# Patient Record
Sex: Female | Born: 1965 | Marital: Married | Smoking: Never smoker
Health system: Southern US, Community
[De-identification: ages and names within clinical notes are randomized; demographics above are authoritative.]

## PROBLEM LIST (undated history)

## (undated) DIAGNOSIS — F319 Bipolar disorder, unspecified: Secondary | ICD-10-CM

## (undated) DIAGNOSIS — E119 Type 2 diabetes mellitus without complications: Secondary | ICD-10-CM

## (undated) DIAGNOSIS — M1990 Unspecified osteoarthritis, unspecified site: Secondary | ICD-10-CM

## (undated) DIAGNOSIS — L989 Disorder of the skin and subcutaneous tissue, unspecified: Secondary | ICD-10-CM

## (undated) DIAGNOSIS — F329 Major depressive disorder, single episode, unspecified: Secondary | ICD-10-CM

## (undated) DIAGNOSIS — F32A Depression, unspecified: Secondary | ICD-10-CM

## (undated) DIAGNOSIS — N644 Mastodynia: Secondary | ICD-10-CM

## (undated) DIAGNOSIS — H9192 Unspecified hearing loss, left ear: Secondary | ICD-10-CM

## (undated) DIAGNOSIS — K3184 Gastroparesis: Secondary | ICD-10-CM

## (undated) HISTORY — DX: Depression, unspecified: F32.A

## (undated) HISTORY — DX: Gastroparesis: K31.84

## (undated) HISTORY — PX: BREAST EXCISIONAL BIOPSY: SUR124

## (undated) HISTORY — PX: ELBOW SURGERY: SHX618

## (undated) HISTORY — PX: GALLBLADDER SURGERY: SHX652

## (undated) HISTORY — DX: Unspecified hearing loss, left ear: H91.92

## (undated) HISTORY — DX: Disorder of the skin and subcutaneous tissue, unspecified: L98.9

## (undated) HISTORY — PX: INNER EAR SURGERY: SHX679

## (undated) HISTORY — PX: ANKLE FRACTURE SURGERY: SHX122

## (undated) HISTORY — PX: OTHER SURGICAL HISTORY: SHX169

## (undated) HISTORY — PX: CERVICAL DISC SURGERY: SHX588

## (undated) HISTORY — PX: BREAST ENHANCEMENT SURGERY: SHX7

## (undated) HISTORY — DX: Unspecified osteoarthritis, unspecified site: M19.90

## (undated) HISTORY — DX: Type 2 diabetes mellitus without complications: E11.9

## (undated) HISTORY — DX: Bipolar disorder, unspecified: F31.9

## (undated) HISTORY — PX: MINOR CARPAL TUNNEL: SHX6472

## (undated) HISTORY — DX: Major depressive disorder, single episode, unspecified: F32.9

---

## 2010-08-16 LAB — CARDIAC PANEL,(CK, CKMB & TROPONIN)
CK - MB: 0.7 ng/ml (ref 0.5–3.6)
CK-MB Index: 0.9 % (ref 0.0–4.0)
CK: 80 U/L (ref 26–192)
Troponin-I, QT: <0.04 NG/ML (ref 0.00–0.05)

## 2010-08-16 LAB — URINALYSIS W/ RFLX MICROSCOPIC
Bilirubin: NEGATIVE
Blood: NEGATIVE
Glucose: >1000 MG/DL — AB
Ketone: NEGATIVE MG/DL
Leukocyte Esterase: NEGATIVE
Nitrites: NEGATIVE
Protein: NEGATIVE MG/DL
Specific gravity: 1.015 (ref 1.003–1.030)
Urobilinogen: 0.2 EU/DL (ref 0.2–1.0)
pH (UA): 6 (ref 5.0–8.0)

## 2010-08-16 LAB — CBC WITH AUTOMATED DIFF
ABS. BASOPHILS: 0 10*3/uL (ref 0.0–0.06)
ABS. EOSINOPHILS: 0.1 10*3/uL (ref 0.0–0.4)
ABS. LYMPHOCYTES: 1 10*3/uL (ref 0.9–3.6)
ABS. MONOCYTES: 0.4 10*3/uL (ref 0.05–1.2)
ABS. NEUTROPHILS: 6.5 10*3/uL (ref 1.8–8.0)
BASOPHILS: 0 % (ref 0–2)
EOSINOPHILS: 1 % (ref 0–5)
HCT: 38.2 % (ref 35.0–45.0)
HGB: 12.2 g/dL (ref 12.0–16.0)
LYMPHOCYTES: 12 % — ABNORMAL LOW (ref 21–52)
MCH: 29.2 PG (ref 24.0–34.0)
MCHC: 31.9 g/dL (ref 31.0–37.0)
MCV: 91.4 FL (ref 74.0–97.0)
MONOCYTES: 6 % (ref 3–10)
MPV: 12.4 FL — ABNORMAL HIGH (ref 9.2–11.8)
NEUTROPHILS: 81 % — ABNORMAL HIGH (ref 40–73)
PLATELET: 232 10*3/uL (ref 135–420)
RBC: 4.18 M/uL — ABNORMAL LOW (ref 4.20–5.30)
RDW: 13 % (ref 11.6–14.5)
WBC: 7.9 10*3/uL (ref 4.6–13.2)

## 2010-08-16 LAB — METABOLIC PANEL, COMPREHENSIVE
A-G Ratio: 0.9 (ref 0.8–1.7)
ALT (SGPT): 39 U/L (ref 30–65)
AST (SGOT): 19 U/L (ref 15–37)
Albumin: 3.6 g/dL (ref 3.4–5.0)
Alk. phosphatase: 98 U/L (ref 50–136)
Anion gap: 3 mmol/L — ABNORMAL LOW (ref 5–15)
BUN/Creatinine ratio: 19 (ref 12–20)
BUN: 17 MG/DL (ref 7–18)
Bilirubin, total: 0.4 MG/DL (ref 0.2–1.0)
CO2: 31 MMOL/L (ref 21–32)
Calcium: 8.6 MG/DL (ref 8.4–10.4)
Chloride: 98 MMOL/L — ABNORMAL LOW (ref 100–108)
Creatinine: 0.9 MG/DL (ref 0.6–1.3)
GFR est AA: >60 mL/min/{1.73_m2} (ref >60)
GFR est non-AA: >60 mL/min/{1.73_m2} (ref >60)
Globulin: 3.8 g/dL (ref 2.0–4.0)
Glucose: 351 MG/DL — ABNORMAL HIGH (ref 74–99)
Potassium: 4.7 MMOL/L (ref 3.5–5.5)
Protein, total: 7.4 g/dL (ref 6.4–8.2)
Sodium: 132 MMOL/L — ABNORMAL LOW (ref 136–145)

## 2010-08-16 LAB — HCG QL SERUM: HCG, Ql.: NEGATIVE

## 2010-08-16 LAB — GLUCOSE, POC: Glucose (POC): 334 mg/dL — ABNORMAL HIGH (ref 70–110)

## 2011-07-23 LAB — METABOLIC PANEL, COMPREHENSIVE
A-G Ratio: 1 (ref 0.8–1.7)
ALT (SGPT): 37 U/L (ref 30–65)
AST (SGOT): 17 U/L (ref 15–37)
Albumin: 3.3 g/dL — ABNORMAL LOW (ref 3.4–5.0)
Alk. phosphatase: 87 U/L (ref 50–136)
Anion gap: 5 mmol/L (ref 5–15)
BUN/Creatinine ratio: 16 (ref 12–20)
BUN: 16 MG/DL (ref 7–18)
Bilirubin, total: 0.2 MG/DL (ref 0.2–1.0)
CO2: 27 MMOL/L (ref 21–32)
Calcium: 8.1 MG/DL — ABNORMAL LOW (ref 8.4–10.4)
Chloride: 101 MMOL/L (ref 100–108)
Creatinine: 1 MG/DL (ref 0.6–1.3)
GFR est AA: >60 mL/min/{1.73_m2} (ref >60)
GFR est non-AA: >60 mL/min/{1.73_m2} (ref >60)
Globulin: 3.3 g/dL (ref 2.0–4.0)
Glucose: 275 MG/DL — ABNORMAL HIGH (ref 74–99)
Potassium: 4.1 MMOL/L (ref 3.5–5.5)
Protein, total: 6.6 g/dL (ref 6.4–8.2)
Sodium: 133 MMOL/L — ABNORMAL LOW (ref 136–145)

## 2011-07-23 LAB — URINALYSIS W/ RFLX MICROSCOPIC
Bilirubin: NEGATIVE
Blood: NEGATIVE
Glucose: >1000 MG/DL — AB
Ketone: NEGATIVE MG/DL
Leukocyte Esterase: NEGATIVE
Nitrites: NEGATIVE
Protein: NEGATIVE MG/DL
Specific gravity: 1.02 (ref 1.003–1.030)
Urobilinogen: 0.2 EU/DL (ref 0.2–1.0)
pH (UA): 6.5 (ref 5.0–8.0)

## 2011-07-23 LAB — CBC WITH AUTOMATED DIFF
ABS. BASOPHILS: 0 10*3/uL (ref 0.0–0.06)
ABS. EOSINOPHILS: 0.2 10*3/uL (ref 0.0–0.4)
ABS. LYMPHOCYTES: 2.1 10*3/uL (ref 0.9–3.6)
ABS. MONOCYTES: 0.5 10*3/uL (ref 0.05–1.2)
ABS. NEUTROPHILS: 4.2 10*3/uL (ref 1.8–8.0)
BASOPHILS: 0 % (ref 0–2)
EOSINOPHILS: 3 % (ref 0–5)
HCT: 38.8 % (ref 35.0–45.0)
HGB: 13 g/dL (ref 12.0–16.0)
LYMPHOCYTES: 30 % (ref 21–52)
MCH: 30.2 PG (ref 24.0–34.0)
MCHC: 33.5 g/dL (ref 31.0–37.0)
MCV: 90.2 FL (ref 74.0–97.0)
MONOCYTES: 7 % (ref 3–10)
MPV: 11.8 FL (ref 9.2–11.8)
NEUTROPHILS: 60 % (ref 40–73)
PLATELET: 217 10*3/uL (ref 135–420)
RBC: 4.3 M/uL (ref 4.20–5.30)
RDW: 13.1 % (ref 11.6–14.5)
WBC: 7 10*3/uL (ref 4.6–13.2)

## 2011-07-23 LAB — LIPASE: Lipase: 54 U/L — ABNORMAL LOW (ref 73–393)

## 2011-07-23 MED ORDER — MORPHINE 4 MG/ML SYRINGE
4 mg/mL | INTRAMUSCULAR | Status: DC
Start: 2011-07-23 — End: 2011-07-23

## 2011-07-23 MED ORDER — ONDANSETRON HCL 4 MG TAB
4 mg | ORAL_TABLET | Freq: Three times a day (TID) | ORAL | Status: DC | PRN
Start: 2011-07-23 — End: 2013-10-17

## 2011-07-23 MED ORDER — HYDROCODONE-ACETAMINOPHEN 5 MG-325 MG TAB
5-325 mg | ORAL_TABLET | Freq: Four times a day (QID) | ORAL | Status: DC | PRN
Start: 2011-07-23 — End: 2013-12-02

## 2011-07-23 MED ORDER — ONDANSETRON (PF) 4 MG/2 ML INJECTION
4 mg/2 mL | Freq: Once | INTRAMUSCULAR | Status: AC
Start: 2011-07-23 — End: 2011-07-23
  Administered 2011-07-23: 05:00:00 via INTRAVENOUS

## 2011-07-23 MED ORDER — KETOROLAC TROMETHAMINE 30 MG/ML INJECTION
30 mg/mL (1 mL) | Freq: Once | INTRAMUSCULAR | Status: AC
Start: 2011-07-23 — End: 2011-07-23
  Administered 2011-07-23: 05:00:00 via INTRAVENOUS

## 2011-07-23 MED ORDER — MECLIZINE 12.5 MG TAB
12.5 mg | ORAL | Status: DC
Start: 2011-07-23 — End: 2011-07-23

## 2011-07-23 MED ORDER — MECLIZINE 25 MG TAB
25 mg | ORAL_TABLET | Freq: Three times a day (TID) | ORAL | Status: AC | PRN
Start: 2011-07-23 — End: 2011-08-02

## 2011-07-23 MED ADMIN — sodium chloride 0.9 % bolus infusion 1,000 mL: INTRAVENOUS | @ 05:00:00 | NDC 00409798309

## 2011-07-23 NOTE — ED Provider Notes (Signed)
I was personally available for consultation in the emergency department.  I have reviewed the chart and agree with the documentation recorded by the MLP, including the assessment, treatment plan, and disposition.  Eda Magnussen S Amely Voorheis, MD

## 2011-07-23 NOTE — ED Provider Notes (Signed)
HPI Comments: 45 y.o. female presents to ED C/O dizziness. Pt describes the dizziness as the room spinning. Pt reports that while in bed this PM she began to have abd pain that felt like it might be heartburn. Pt then reports that she got really dizzy and nauseated so she got up and went to the bathroom, but she did not vomit. Pt states that she went back and laid down, but was very dizzy when she closed her eyes so she got back up and did vomit. Pt reports that she had a similar episode 1 week ago, but it passed and was not as bad as this. Pt reports that she has surgery to close up her L ear drum this past May (about 3-4 months ago), and that the surgery was complex because her ear is so small. Pt reports that since the surgery she has had sporadic dizzy spells. Pt had a follow up appointment with her ENT doctor, Dr. Sarina Ser, but cancelled it and will not be able to follow-up until September or October. Dr. Rock Nephew denies HA, change in vision, blurry vision, or any other sxs or complaints.    Patient is a 45 y.o. female presenting with dizziness. The history is provided by the patient. No language interpreter was used.   Dizziness  This is a new problem. The current episode started 3 to 5 hours ago. The problem has not changed since onset.Pertinent negatives include no visual change. There has been no fever. Associated symptoms include vomiting and nausea. Pertinent negatives include no shortness of breath, no chest pain and no headaches.      Written by Arvilla Market, ED Scribe, as dictated by Reina Fuse, PA-C.    Past Medical History   Diagnosis Date   ??? Diabetes    ??? Psychiatric disorder      bipolar        Past Surgical History   Procedure Date   ??? Hx heent      ear surgery         No family history on file.     History     Social History   ??? Marital Status: Married     Spouse Name: N/A     Number of Children: N/A   ??? Years of Education: N/A     Occupational History   ??? Not on file.      Social History Main Topics   ??? Smoking status: Never Smoker    ??? Smokeless tobacco: Not on file   ??? Alcohol Use:    ??? Drug Use:    ??? Sexually Active:      Other Topics Concern   ??? Not on file     Social History Narrative   ??? No narrative on file                  ALLERGIES: Review of patient's allergies indicates no known allergies.      Review of Systems   Constitutional: Negative for fever, activity change, appetite change and unexpected weight change.   HENT: Negative for congestion and sore throat.    Eyes: Negative for visual disturbance.   Respiratory: Negative for shortness of breath.    Cardiovascular: Negative for chest pain.   Gastrointestinal: Positive for nausea and vomiting. Negative for abdominal pain and diarrhea.   Genitourinary: Negative for difficulty urinating.   Musculoskeletal: Negative for back pain.   Skin: Negative for rash.   Neurological: Positive for dizziness. Negative for  headaches.   All other systems reviewed and are negative.      Written by Arvilla Market, ED Scribe, as dictated by Reina Fuse, PA-C.    Filed Vitals:    07/23/11 0016   BP: 132/77   Pulse: 83   Temp: 97.9 ??F (36.6 ??C)   Resp: 18   Height: 5\' 2"  (1.575 m)   Weight: 61.236 kg (135 lb)   SpO2: 99%            Physical Exam   Nursing note and vitals reviewed.  Constitutional: She is oriented to person, place, and time. She appears well-developed and well-nourished. She appears distressed (mild distress due to dizziness and nausea).   HENT:   Head: Normocephalic and atraumatic.   Right Ear: Tympanic membrane, external ear and ear canal normal.   Left Ear: Tympanic membrane, external ear and ear canal normal.   Nose: Nose normal. No mucosal edema or rhinorrhea.   Mouth/Throat: Oropharynx is clear and moist.   Eyes: Conjunctivae and EOM are normal. Pupils are equal, round, and reactive to light.   Neck: Normal range of motion. Neck supple.   Cardiovascular: Normal rate, regular rhythm and normal heart sounds.     Pulmonary/Chest: Effort normal and breath sounds normal. No respiratory distress.   Abdominal: Soft. Bowel sounds are normal. She exhibits no distension. There is no tenderness. There is no rebound and no guarding.   Musculoskeletal: Normal range of motion.   Lymphadenopathy:     She has no cervical adenopathy.   Neurological: She is alert and oriented to person, place, and time. She has normal reflexes. She displays normal reflexes. No cranial nerve deficit. Coordination normal.   Skin: Skin is warm and dry.        MDM     Differential Diagnosis; Clinical Impression; Plan:     Pt with dizziness, worse with movement of head or upper body. +N/V. Labs unremarkable. Pt feeling better.   Amount and/or Complexity of Data Reviewed:   Clinical lab tests:  Ordered and reviewed  Progress:   Patient progress:  Improved      Procedures        RESULTS    EKG FINDING:  1:21 AM  Normal Sinus Rhythm; rate: 73bpm; Other findings: Normal ECG; Read by Reina Fuse, PA-C at 00:57  Written by Arvilla Market, ED Scribe, as dictated by Reina Fuse, PA-C     Labs Reviewed   METABOLIC PANEL, COMPREHENSIVE - Abnormal; Notable for the following:     Sodium 133 (*)     Glucose 275 (*)     Calcium 8.1 (*)     Albumin 3.3 (*)     All other components within normal limits   LIPASE - Abnormal; Notable for the following:     Lipase 54 (*)     All other components within normal limits   URINALYSIS W/ RFLX MICROSCOPIC - Abnormal; Notable for the following:     Glucose >1000 (*)     All other components within normal limits   CBC WITH AUTOMATED DIFF       Recent Results (from the past 12 hour(s))   EKG, 12 LEAD, INITIAL    Collection Time    07/23/11 12:57 AM       Component Value Range    Ventricular Rate 73      Atrial Rate 73      P-R Interval 134      QRS Duration 82  Q-T Interval 414      QTC Calculation (Bezet) 456      Calculated P Axis 71      Calculated R Axis 79      Calculated T Axis 72      Diagnosis         Value: Normal sinus rhythm      Normal ECG      When compared with ECG of 16-Aug-2010 16:29,      No significant change was found   METABOLIC PANEL, COMPREHENSIVE    Collection Time    07/23/11  1:00 AM       Component Value Range    Sodium 133 (*) 136 - 145 (MMOL/L)    Potassium 4.1  3.5 - 5.5 (MMOL/L)    Chloride 101  100 - 108 (MMOL/L)    CO2 27  21 - 32 (MMOL/L)    Anion gap 5  5 - 15 (mmol/L)    Glucose 275 (*) 74 - 99 (MG/DL)    BUN 16  7 - 18 (MG/DL)    Creatinine 1.0  0.6 - 1.3 (MG/DL)    BUN/Creatinine ratio 16  12 - 20 ( )    GFR est AA >60  >60 (ml/min/1.93m2)    GFR est non-AA >60  >60 (ml/min/1.10m2)    Calcium 8.1 (*) 8.4 - 10.4 (MG/DL)    Bilirubin, total 0.2  0.2 - 1.0 (MG/DL)    ALT 37  30 - 65 (U/L)    AST 17  15 - 37 (U/L)    Alk. phosphatase 87  50 - 136 (U/L)    Protein, total 6.6  6.4 - 8.2 (g/dL)    Albumin 3.3 (*) 3.4 - 5.0 (g/dL)    Globulin 3.3  2.0 - 4.0 (g/dL)    A-G Ratio 1.0  0.8 - 1.7 ( )   LIPASE    Collection Time    07/23/11  1:00 AM       Component Value Range    Lipase 54 (*) 73 - 393 (U/L)   CBC WITH AUTOMATED DIFF    Collection Time    07/23/11  1:00 AM       Component Value Range    WBC 7.0  4.6 - 13.2 (K/uL)    RBC 4.30  4.20 - 5.30 (M/uL)    HGB 13.0  12.0 - 16.0 (g/dL)    HCT 16.1  09.6 - 04.5 (%)    MCV 90.2  74.0 - 97.0 (FL)    MCH 30.2  24.0 - 34.0 (PG)    MCHC 33.5  31.0 - 37.0 (g/dL)    RDW 40.9  81.1 - 91.4 (%)    PLATELET 217  135 - 420 (K/uL)    MPV 11.8  9.2 - 11.8 (FL)    NEUTROPHILS 60  40 - 73 (%)    LYMPHOCYTES 30  21 - 52 (%)    MONOCYTES 7  3 - 10 (%)    EOSINOPHILS 3  0 - 5 (%)    BASOPHILS 0  0 - 2 (%)    ABS. NEUTROPHILS 4.2  1.8 - 8.0 (K/UL)    ABS. LYMPHOCYTES 2.1  0.9 - 3.6 (K/UL)    ABS. MONOCYTES 0.5  0.05 - 1.2 (K/UL)    ABS. EOSINOPHILS 0.2  0.0 - 0.4 (K/UL)    ABS. BASOPHILS 0.0  0.0 - 0.06 (K/UL)    DF AUTOMATED     URINALYSIS W/ RFLX MICROSCOPIC  Collection Time    07/23/11  1:08 AM       Component Value Range    Color YELLOW       Appearance HAZY      Specific gravity 1.020  1.003 - 1.030 ( )    pH 6.5  5.0 - 8.0 ( )    Protein NEGATIVE   NEGATIVE (MG/DL)    Glucose >4540 (*) NEGATIVE (MG/DL)    Ketone NEGATIVE   NEGATIVE (MG/DL)    Bilirubin NEGATIVE   NEGATIVE     Blood NEGATIVE   NEGATIVE     Urobilinogen 0.2  0.2 - 1.0 (EU/DL)    Nitrites NEGATIVE   NEGATIVE     Leukocyte Esterase NEGATIVE   NEGATIVE        2:26 AM  Patients results have been reviewed with them.  Patient and/or family have verbally conveyed their understanding and agreement of the patient's signs, symptoms, diagnosis, treatment and prognosis and additionally agree to follow up as recommended or return to the Emergency Room should their condition change prior to their follow-up appointment. Patient verbally agrees with the care-plan and verbally conveys that all of their questions have been answered.   Discharge instructions have also been provided to the patient with some educational information regarding their diagnosis as well a list of reasons why they would want to return to the ER prior to their follow-up appointment should their condition change.     CLINICAL IMPRESSION    1. Vertigo, benign paroxysmal    2. Nausea with vomiting                Answered pt and family questions. Written by Arvilla Market, ED Scribe, as dictated by Reina Fuse, PA-C.

## 2011-07-23 NOTE — ED Notes (Signed)
Pt sitting upright in stretcher. Fam at bedside. INT in place, site benign. Pt co return in nausea and pain

## 2011-07-23 NOTE — Other (Signed)
Pt dcd, verbs understanding of dc instructions. NAD.

## 2011-07-23 NOTE — ED Notes (Signed)
Complains of waking up with dizziness and nausea.

## 2011-07-23 NOTE — ED Notes (Signed)
Pt complains of nausea and abdominal pain, worsening with movement and laying flat, started around 2330 tonight, history of ear surgery in May.

## 2011-07-24 LAB — EKG, 12 LEAD, INITIAL
Atrial Rate: 73 {beats}/min
Calculated P Axis: 71 degrees
Calculated R Axis: 79 degrees
Calculated T Axis: 72 degrees
Diagnosis: NORMAL
P-R Interval: 134 ms
Q-T Interval: 414 ms
QRS Duration: 82 ms
QTC Calculation (Bezet): 456 ms
Ventricular Rate: 73 {beats}/min

## 2011-12-12 DIAGNOSIS — K5900 Constipation, unspecified: Secondary | ICD-10-CM | POA: Diagnosis not present

## 2011-12-12 DIAGNOSIS — E1065 Type 1 diabetes mellitus with hyperglycemia: Secondary | ICD-10-CM | POA: Diagnosis not present

## 2011-12-12 DIAGNOSIS — IMO0002 Reserved for concepts with insufficient information to code with codable children: Secondary | ICD-10-CM | POA: Diagnosis not present

## 2012-01-03 DIAGNOSIS — H905 Unspecified sensorineural hearing loss: Secondary | ICD-10-CM | POA: Diagnosis not present

## 2012-01-03 DIAGNOSIS — H908 Mixed conductive and sensorineural hearing loss, unspecified: Secondary | ICD-10-CM | POA: Diagnosis not present

## 2012-01-03 DIAGNOSIS — H7190 Unspecified cholesteatoma, unspecified ear: Secondary | ICD-10-CM | POA: Diagnosis not present

## 2012-01-03 DIAGNOSIS — H93299 Other abnormal auditory perceptions, unspecified ear: Secondary | ICD-10-CM | POA: Diagnosis not present

## 2012-01-03 DIAGNOSIS — H902 Conductive hearing loss, unspecified: Secondary | ICD-10-CM | POA: Diagnosis not present

## 2012-01-09 DIAGNOSIS — Z1231 Encounter for screening mammogram for malignant neoplasm of breast: Secondary | ICD-10-CM | POA: Diagnosis not present

## 2012-04-11 DIAGNOSIS — N644 Mastodynia: Secondary | ICD-10-CM | POA: Diagnosis not present

## 2012-04-11 DIAGNOSIS — E1065 Type 1 diabetes mellitus with hyperglycemia: Secondary | ICD-10-CM | POA: Diagnosis not present

## 2012-04-11 DIAGNOSIS — IMO0002 Reserved for concepts with insufficient information to code with codable children: Secondary | ICD-10-CM | POA: Diagnosis not present

## 2012-04-11 DIAGNOSIS — M2550 Pain in unspecified joint: Secondary | ICD-10-CM | POA: Diagnosis not present

## 2012-04-11 DIAGNOSIS — N63 Unspecified lump in unspecified breast: Secondary | ICD-10-CM | POA: Diagnosis not present

## 2012-05-02 DIAGNOSIS — F609 Personality disorder, unspecified: Secondary | ICD-10-CM | POA: Diagnosis not present

## 2012-05-02 DIAGNOSIS — F3289 Other specified depressive episodes: Secondary | ICD-10-CM | POA: Diagnosis not present

## 2012-05-02 DIAGNOSIS — F329 Major depressive disorder, single episode, unspecified: Secondary | ICD-10-CM | POA: Diagnosis not present

## 2012-06-25 DIAGNOSIS — H902 Conductive hearing loss, unspecified: Secondary | ICD-10-CM | POA: Diagnosis not present

## 2012-06-25 DIAGNOSIS — H60399 Other infective otitis externa, unspecified ear: Secondary | ICD-10-CM | POA: Diagnosis not present

## 2012-07-10 DIAGNOSIS — H60399 Other infective otitis externa, unspecified ear: Secondary | ICD-10-CM | POA: Diagnosis not present

## 2012-07-10 DIAGNOSIS — H7290 Unspecified perforation of tympanic membrane, unspecified ear: Secondary | ICD-10-CM | POA: Diagnosis not present

## 2012-07-10 DIAGNOSIS — H902 Conductive hearing loss, unspecified: Secondary | ICD-10-CM | POA: Diagnosis not present

## 2012-10-21 DIAGNOSIS — F609 Personality disorder, unspecified: Secondary | ICD-10-CM | POA: Diagnosis not present

## 2012-10-21 DIAGNOSIS — F329 Major depressive disorder, single episode, unspecified: Secondary | ICD-10-CM | POA: Diagnosis not present

## 2012-10-21 DIAGNOSIS — F3289 Other specified depressive episodes: Secondary | ICD-10-CM | POA: Diagnosis not present

## 2013-01-16 DIAGNOSIS — H905 Unspecified sensorineural hearing loss: Secondary | ICD-10-CM | POA: Diagnosis not present

## 2013-01-16 DIAGNOSIS — H902 Conductive hearing loss, unspecified: Secondary | ICD-10-CM | POA: Diagnosis not present

## 2013-01-16 DIAGNOSIS — H7290 Unspecified perforation of tympanic membrane, unspecified ear: Secondary | ICD-10-CM | POA: Diagnosis not present

## 2013-01-16 DIAGNOSIS — H6690 Otitis media, unspecified, unspecified ear: Secondary | ICD-10-CM | POA: Diagnosis not present

## 2013-01-30 DIAGNOSIS — M79609 Pain in unspecified limb: Secondary | ICD-10-CM | POA: Diagnosis not present

## 2013-01-30 DIAGNOSIS — S8290XS Unspecified fracture of unspecified lower leg, sequela: Secondary | ICD-10-CM | POA: Diagnosis not present

## 2013-01-30 DIAGNOSIS — M25579 Pain in unspecified ankle and joints of unspecified foot: Secondary | ICD-10-CM | POA: Diagnosis not present

## 2013-01-30 DIAGNOSIS — S8290XD Unspecified fracture of unspecified lower leg, subsequent encounter for closed fracture with routine healing: Secondary | ICD-10-CM | POA: Diagnosis not present

## 2013-02-04 DIAGNOSIS — M79609 Pain in unspecified limb: Secondary | ICD-10-CM | POA: Diagnosis not present

## 2013-02-04 DIAGNOSIS — G5600 Carpal tunnel syndrome, unspecified upper limb: Secondary | ICD-10-CM | POA: Diagnosis not present

## 2013-02-04 DIAGNOSIS — M25549 Pain in joints of unspecified hand: Secondary | ICD-10-CM | POA: Diagnosis not present

## 2013-02-06 DIAGNOSIS — S8290XD Unspecified fracture of unspecified lower leg, subsequent encounter for closed fracture with routine healing: Secondary | ICD-10-CM | POA: Diagnosis not present

## 2013-02-06 DIAGNOSIS — M79609 Pain in unspecified limb: Secondary | ICD-10-CM | POA: Diagnosis not present

## 2013-02-06 DIAGNOSIS — S8290XS Unspecified fracture of unspecified lower leg, sequela: Secondary | ICD-10-CM | POA: Diagnosis not present

## 2013-02-11 DIAGNOSIS — S82409A Unspecified fracture of shaft of unspecified fibula, initial encounter for closed fracture: Secondary | ICD-10-CM | POA: Diagnosis not present

## 2013-02-11 DIAGNOSIS — M79609 Pain in unspecified limb: Secondary | ICD-10-CM | POA: Diagnosis not present

## 2013-02-11 DIAGNOSIS — S82209A Unspecified fracture of shaft of unspecified tibia, initial encounter for closed fracture: Secondary | ICD-10-CM | POA: Diagnosis not present

## 2013-02-13 DIAGNOSIS — S8290XS Unspecified fracture of unspecified lower leg, sequela: Secondary | ICD-10-CM | POA: Diagnosis not present

## 2013-02-13 DIAGNOSIS — S8290XD Unspecified fracture of unspecified lower leg, subsequent encounter for closed fracture with routine healing: Secondary | ICD-10-CM | POA: Diagnosis not present

## 2013-03-11 DIAGNOSIS — H25049 Posterior subcapsular polar age-related cataract, unspecified eye: Secondary | ICD-10-CM | POA: Diagnosis not present

## 2013-03-11 DIAGNOSIS — E109 Type 1 diabetes mellitus without complications: Secondary | ICD-10-CM | POA: Diagnosis not present

## 2013-03-21 DIAGNOSIS — R209 Unspecified disturbances of skin sensation: Secondary | ICD-10-CM | POA: Diagnosis not present

## 2013-03-24 DIAGNOSIS — F3289 Other specified depressive episodes: Secondary | ICD-10-CM | POA: Diagnosis not present

## 2013-03-24 DIAGNOSIS — F329 Major depressive disorder, single episode, unspecified: Secondary | ICD-10-CM | POA: Diagnosis not present

## 2013-03-24 DIAGNOSIS — F609 Personality disorder, unspecified: Secondary | ICD-10-CM | POA: Diagnosis not present

## 2013-05-07 DIAGNOSIS — F609 Personality disorder, unspecified: Secondary | ICD-10-CM | POA: Diagnosis not present

## 2013-05-07 DIAGNOSIS — F329 Major depressive disorder, single episode, unspecified: Secondary | ICD-10-CM | POA: Diagnosis not present

## 2013-05-07 DIAGNOSIS — F3289 Other specified depressive episodes: Secondary | ICD-10-CM | POA: Diagnosis not present

## 2013-05-09 DIAGNOSIS — G5600 Carpal tunnel syndrome, unspecified upper limb: Secondary | ICD-10-CM | POA: Diagnosis not present

## 2013-05-14 DIAGNOSIS — IMO0002 Reserved for concepts with insufficient information to code with codable children: Secondary | ICD-10-CM | POA: Diagnosis not present

## 2013-05-14 DIAGNOSIS — M76899 Other specified enthesopathies of unspecified lower limb, excluding foot: Secondary | ICD-10-CM | POA: Diagnosis not present

## 2013-05-14 DIAGNOSIS — M25569 Pain in unspecified knee: Secondary | ICD-10-CM | POA: Diagnosis not present

## 2013-06-12 DIAGNOSIS — G5600 Carpal tunnel syndrome, unspecified upper limb: Secondary | ICD-10-CM | POA: Diagnosis not present

## 2013-06-13 DIAGNOSIS — Z794 Long term (current) use of insulin: Secondary | ICD-10-CM | POA: Diagnosis not present

## 2013-06-13 DIAGNOSIS — H538 Other visual disturbances: Secondary | ICD-10-CM | POA: Diagnosis not present

## 2013-06-13 DIAGNOSIS — E109 Type 1 diabetes mellitus without complications: Secondary | ICD-10-CM | POA: Diagnosis not present

## 2013-06-13 DIAGNOSIS — F319 Bipolar disorder, unspecified: Secondary | ICD-10-CM | POA: Diagnosis not present

## 2013-06-13 DIAGNOSIS — H918X9 Other specified hearing loss, unspecified ear: Secondary | ICD-10-CM | POA: Diagnosis not present

## 2013-06-13 DIAGNOSIS — R209 Unspecified disturbances of skin sensation: Secondary | ICD-10-CM | POA: Diagnosis not present

## 2013-06-13 DIAGNOSIS — Z79899 Other long term (current) drug therapy: Secondary | ICD-10-CM | POA: Diagnosis not present

## 2013-06-20 DIAGNOSIS — R109 Unspecified abdominal pain: Secondary | ICD-10-CM | POA: Diagnosis not present

## 2013-06-20 DIAGNOSIS — Z9981 Dependence on supplemental oxygen: Secondary | ICD-10-CM | POA: Diagnosis not present

## 2013-06-20 DIAGNOSIS — N1330 Unspecified hydronephrosis: Secondary | ICD-10-CM | POA: Diagnosis not present

## 2013-06-20 DIAGNOSIS — N134 Hydroureter: Secondary | ICD-10-CM | POA: Diagnosis not present

## 2013-06-20 DIAGNOSIS — N201 Calculus of ureter: Secondary | ICD-10-CM | POA: Diagnosis not present

## 2013-06-20 DIAGNOSIS — R1084 Generalized abdominal pain: Secondary | ICD-10-CM | POA: Diagnosis not present

## 2013-06-20 DIAGNOSIS — N23 Unspecified renal colic: Secondary | ICD-10-CM | POA: Diagnosis not present

## 2013-06-20 DIAGNOSIS — Z794 Long term (current) use of insulin: Secondary | ICD-10-CM | POA: Diagnosis not present

## 2013-06-20 DIAGNOSIS — J449 Chronic obstructive pulmonary disease, unspecified: Secondary | ICD-10-CM | POA: Diagnosis not present

## 2013-06-20 DIAGNOSIS — N200 Calculus of kidney: Secondary | ICD-10-CM | POA: Diagnosis not present

## 2013-06-20 DIAGNOSIS — R1031 Right lower quadrant pain: Secondary | ICD-10-CM | POA: Diagnosis not present

## 2013-06-20 DIAGNOSIS — E119 Type 2 diabetes mellitus without complications: Secondary | ICD-10-CM | POA: Diagnosis not present

## 2013-07-23 DIAGNOSIS — F3289 Other specified depressive episodes: Secondary | ICD-10-CM | POA: Diagnosis not present

## 2013-07-23 DIAGNOSIS — F609 Personality disorder, unspecified: Secondary | ICD-10-CM | POA: Diagnosis not present

## 2013-07-23 DIAGNOSIS — F329 Major depressive disorder, single episode, unspecified: Secondary | ICD-10-CM | POA: Diagnosis not present

## 2013-07-24 DIAGNOSIS — J029 Acute pharyngitis, unspecified: Secondary | ICD-10-CM | POA: Diagnosis not present

## 2013-07-29 DIAGNOSIS — H1030 Unspecified acute conjunctivitis, unspecified eye: Secondary | ICD-10-CM | POA: Diagnosis not present

## 2013-07-30 DIAGNOSIS — H25049 Posterior subcapsular polar age-related cataract, unspecified eye: Secondary | ICD-10-CM | POA: Diagnosis not present

## 2013-08-08 DIAGNOSIS — J029 Acute pharyngitis, unspecified: Secondary | ICD-10-CM | POA: Diagnosis not present

## 2013-08-20 DIAGNOSIS — F609 Personality disorder, unspecified: Secondary | ICD-10-CM | POA: Diagnosis not present

## 2013-08-20 DIAGNOSIS — F329 Major depressive disorder, single episode, unspecified: Secondary | ICD-10-CM | POA: Diagnosis not present

## 2013-08-20 DIAGNOSIS — F3289 Other specified depressive episodes: Secondary | ICD-10-CM | POA: Diagnosis not present

## 2013-10-17 DIAGNOSIS — R4182 Altered mental status, unspecified: Secondary | ICD-10-CM | POA: Diagnosis not present

## 2013-10-17 DIAGNOSIS — E161 Other hypoglycemia: Secondary | ICD-10-CM | POA: Diagnosis not present

## 2013-10-17 DIAGNOSIS — E1169 Type 2 diabetes mellitus with other specified complication: Secondary | ICD-10-CM | POA: Diagnosis not present

## 2013-10-17 DIAGNOSIS — R7301 Impaired fasting glucose: Secondary | ICD-10-CM | POA: Diagnosis not present

## 2013-10-17 DIAGNOSIS — Z794 Long term (current) use of insulin: Secondary | ICD-10-CM | POA: Diagnosis not present

## 2013-10-17 LAB — METABOLIC PANEL, COMPREHENSIVE
A-G Ratio: 0.9 (ref 0.8–1.7)
ALT (SGPT): 20 U/L (ref 13–56)
AST (SGOT): 15 U/L (ref 15–37)
Albumin: 3.3 g/dL — ABNORMAL LOW (ref 3.4–5.0)
Alk. phosphatase: 64 U/L (ref 45–117)
Anion gap: 9 mmol/L (ref 3.0–18)
BUN/Creatinine ratio: 32 — ABNORMAL HIGH (ref 12–20)
BUN: 24 MG/DL — ABNORMAL HIGH (ref 7.0–18)
Bilirubin, total: 0.4 MG/DL (ref 0.2–1.0)
CO2: 28 mmol/L (ref 21–32)
Calcium: 8.1 MG/DL — ABNORMAL LOW (ref 8.5–10.1)
Chloride: 104 mmol/L (ref 100–108)
Creatinine: 0.76 MG/DL (ref 0.6–1.3)
GFR est AA: >60 mL/min/{1.73_m2} (ref >60)
GFR est non-AA: >60 mL/min/{1.73_m2} (ref >60)
Globulin: 3.7 g/dL (ref 2.0–4.0)
Glucose: 145 mg/dL — ABNORMAL HIGH (ref 74–99)
Potassium: 3.8 mmol/L (ref 3.5–5.5)
Protein, total: 7 g/dL (ref 6.4–8.2)
Sodium: 141 mmol/L (ref 136–145)

## 2013-10-17 LAB — CBC WITH AUTOMATED DIFF
ABS. BASOPHILS: 0 10*3/uL (ref 0.0–0.06)
ABS. EOSINOPHILS: 0.3 10*3/uL (ref 0.0–0.4)
ABS. LYMPHOCYTES: 0.8 10*3/uL — ABNORMAL LOW (ref 0.9–3.6)
ABS. MONOCYTES: 0.5 10*3/uL (ref 0.05–1.2)
ABS. NEUTROPHILS: 7.1 10*3/uL (ref 1.8–8.0)
BASOPHILS: 0 % (ref 0–2)
EOSINOPHILS: 3 % (ref 0–5)
HCT: 38.5 % (ref 35.0–45.0)
HGB: 12.7 g/dL (ref 12.0–16.0)
LYMPHOCYTES: 9 % — ABNORMAL LOW (ref 21–52)
MCH: 29.6 PG (ref 24.0–34.0)
MCHC: 33 g/dL (ref 31.0–37.0)
MCV: 89.7 FL (ref 74.0–97.0)
MONOCYTES: 6 % (ref 3–10)
MPV: 12.5 FL — ABNORMAL HIGH (ref 9.2–11.8)
NEUTROPHILS: 82 % — ABNORMAL HIGH (ref 40–73)
PLATELET: 223 10*3/uL (ref 135–420)
RBC: 4.29 M/uL (ref 4.20–5.30)
RDW: 13.4 % (ref 11.6–14.5)
WBC: 8.6 10*3/uL (ref 4.6–13.2)

## 2013-10-17 LAB — GLUCOSE, POC
Glucose (POC): 160 mg/dL — ABNORMAL HIGH (ref 70–110)
Glucose (POC): 59 mg/dL — ABNORMAL LOW (ref 70–110)
Glucose (POC): 104 mg/dL (ref 70–110)
Glucose (POC): 91 mg/dL (ref 70–110)

## 2013-10-17 NOTE — ED Notes (Signed)
POC glucose=160

## 2013-10-17 NOTE — ED Notes (Signed)
Discharge paperwork given to patient.  Patient verbalizes understanding.  Armband removed and shredded.

## 2013-10-17 NOTE — ED Provider Notes (Signed)
HPI Comments:   12:57 PM   47 y.o. female with Hx of DM presents to the ED via EMS for hypoglycemia. When EMS arrived, pt's FSBS was less than 20. EMS gave D50 and POC glucose on arrival to ED was 160. Pt confirms taking insulin earlier today but did not eat a meal. Pt states she feels normal now. Pt denies chest pain, SOB and any other sx's or complaints.    Patient is a 47 y.o. female presenting with hypoglycemia. The history is provided by the patient and the EMS personnel. No language interpreter was used.   Low Blood Sugar   The current episode started less than 1 hour ago. The problem has been resolved. Her past medical history is significant for diabetes.   Written by Broadus John, ED Scribe, as dictated by Herma Carson, MD.      Past Medical History   Diagnosis Date   ??? Diabetes    ??? Psychiatric disorder      bipolar        Past Surgical History   Procedure Laterality Date   ??? Hx heent       ear surgery   ??? Hx other surgical       cosmetic surgery         No family history on file.     History     Social History   ??? Marital Status: MARRIED     Spouse Name: N/A     Number of Children: N/A   ??? Years of Education: N/A     Occupational History   ??? Not on file.     Social History Main Topics   ??? Smoking status: Never Smoker    ??? Smokeless tobacco: Not on file   ??? Alcohol Use: Not on file   ??? Drug Use: Not on file   ??? Sexually Active: Not on file     Other Topics Concern   ??? Not on file     Social History Narrative   ??? No narrative on file            ALLERGIES: Review of patient's allergies indicates no known allergies.      Review of Systems   Constitutional: Negative for fever and fatigue.        Feels back to normal.   HENT: Negative for sore throat and rhinorrhea.    Respiratory: Negative for cough and shortness of breath.    Cardiovascular: Negative for chest pain and palpitations.   Gastrointestinal: Negative for nausea, vomiting, abdominal pain and diarrhea.   Genitourinary: Negative for dysuria and  difficulty urinating.   Musculoskeletal: Negative for myalgias and arthralgias.   Skin: Negative for color change and rash.   Neurological: Negative for light-headedness and headaches.   All other systems reviewed and are negative.        Filed Vitals:    10/17/13 1400 10/17/13 1430 10/17/13 1500 10/17/13 1530   BP: 118/72 125/69 121/76 108/62   Pulse: 84 82 94 94   Temp:       Resp: 16 17 20 18    Height:       Weight:       SpO2: 100% 100% 100% 100%            Physical Exam   Nursing note and vitals reviewed.  Constitutional: She is oriented to person, place, and time. She appears well-developed and well-nourished.   HENT:   Head: Normocephalic and atraumatic.  Eyes: Conjunctivae and EOM are normal. Pupils are equal, round, and reactive to light.   Neck: Normal range of motion. Neck supple.   Cardiovascular: Normal rate, regular rhythm and normal heart sounds.    No murmur heard.  Pulmonary/Chest: Effort normal and breath sounds normal. No respiratory distress.   Abdominal: Soft. Bowel sounds are normal. There is no tenderness. There is no rebound and no guarding.   Musculoskeletal: Normal range of motion. She exhibits no edema.   Lymphadenopathy:     She has no cervical adenopathy.   Neurological: She is alert and oriented to person, place, and time. No cranial nerve deficit.   Skin: Skin is warm and dry. No rash noted. No erythema.   Psychiatric: She has a normal mood and affect. Her behavior is normal.        RESULTS:         Labs Reviewed   CBC WITH AUTOMATED DIFF - Abnormal; Notable for the following:     MPV 12.5 (*)     NEUTROPHILS 82 (*)     LYMPHOCYTES 9 (*)     ABS. LYMPHOCYTES 0.8 (*)     All other components within normal limits   METABOLIC PANEL, COMPREHENSIVE - Abnormal; Notable for the following:     Glucose 145 (*)     BUN 24 (*)     BUN/Creatinine ratio 32 (*)     Calcium 8.1 (*)     Albumin 3.3 (*)     All other components within normal limits   GLUCOSE, POC - Abnormal; Notable for the following:      Glucose (POC) 160 (*)     All other components within normal limits   GLUCOSE, POC - Abnormal; Notable for the following:     Glucose (POC) 59 (*)     All other components within normal limits   GLUCOSE, POC   POC GLUCOSE   POC GLUCOSE   POC GLUCOSE   POC GLUCOSE       Recent Results (from the past 12 hour(s))   GLUCOSE, POC    Collection Time     10/17/13 12:49 PM       Result Value Range    Glucose (POC) 160 (*) 70 - 110 mg/dL   CBC WITH AUTOMATED DIFF    Collection Time     10/17/13  1:00 PM       Result Value Range    WBC 8.6  4.6 - 13.2 K/uL    RBC 4.29  4.20 - 5.30 M/uL    HGB 12.7  12.0 - 16.0 g/dL    HCT 16.1  09.6 - 04.5 %    MCV 89.7  74.0 - 97.0 FL    MCH 29.6  24.0 - 34.0 PG    MCHC 33.0  31.0 - 37.0 g/dL    RDW 40.9  81.1 - 91.4 %    PLATELET 223  135 - 420 K/uL    MPV 12.5 (*) 9.2 - 11.8 FL    NEUTROPHILS 82 (*) 40 - 73 %    LYMPHOCYTES 9 (*) 21 - 52 %    MONOCYTES 6  3 - 10 %    EOSINOPHILS 3  0 - 5 %    BASOPHILS 0  0 - 2 %    ABS. NEUTROPHILS 7.1  1.8 - 8.0 K/UL    ABS. LYMPHOCYTES 0.8 (*) 0.9 - 3.6 K/UL    ABS. MONOCYTES 0.5  0.05 - 1.2 K/UL  ABS. EOSINOPHILS 0.3  0.0 - 0.4 K/UL    ABS. BASOPHILS 0.0  0.0 - 0.06 K/UL    DF AUTOMATED     METABOLIC PANEL, COMPREHENSIVE    Collection Time     10/17/13  1:00 PM       Result Value Range    Sodium 141  136 - 145 mmol/L    Potassium 3.8  3.5 - 5.5 mmol/L    Chloride 104  100 - 108 mmol/L    CO2 28  21 - 32 mmol/L    Anion gap 9  3.0 - 18 mmol/L    Glucose 145 (*) 74 - 99 mg/dL    BUN 24 (*) 7.0 - 18 MG/DL    Creatinine 1.61  0.6 - 1.3 MG/DL    BUN/Creatinine ratio 32 (*) 12 - 20      GFR est AA >60  >60 ml/min/1.33m2    GFR est non-AA >60  >60 ml/min/1.71m2    Calcium 8.1 (*) 8.5 - 10.1 MG/DL    Bilirubin, total 0.4  0.2 - 1.0 MG/DL    ALT 20  13 - 56 U/L    AST 15  15 - 37 U/L    Alk. phosphatase 64  45 - 117 U/L    Protein, total 7.0  6.4 - 8.2 g/dL    Albumin 3.3 (*) 3.4 - 5.0 g/dL    Globulin 3.7  2.0 - 4.0 g/dL    A-G Ratio 0.9  0.8 - 1.7      GLUCOSE, POC    Collection Time     10/17/13  3:13 PM       Result Value Range    Glucose (POC) 59 (*) 70 - 110 mg/dL   GLUCOSE, POC    Collection Time     10/17/13  3:31 PM       Result Value Range    Glucose (POC) 91  70 - 110 mg/dL        MDM     Differential Diagnosis; Clinical Impression; Plan:     DDx includes: hypoglycemia  Amount and/or Complexity of Data Reviewed:   Clinical lab tests:  Ordered and reviewed  Discussion of test results with the performing providers:  No   Decide to obtain previous medical records or to obtain history from someone other than the patient:  No   Obtain history from someone other than the patient:  Yes (EMS)   Review and summarize past medical records:  Yes   Discuss the patient with another provider:  No   Independant visualization of image, tracing, or specimen:  No      ED MEDICATIONS:  Medications - No data to display        Procedures    PROGRESS NOTE:   1:00 PM   Initial assessment performed.  Written by Broadus John, ED Scribe, as dictated by Herma Carson, MD.    DISCHARGE NOTE:  4:16 PM   Talmadge Chad  results have been reviewed with her.  She has been counseled regarding her diagnosis, treatment, and plan.  She verbally conveys understanding and agreement of the signs, symptoms, diagnosis, treatment and prognosis and additionally agrees to follow up as discussed.  She also agrees with the care-plan and conveys that all of her questions have been answered.  I have also provided discharge instructions for her that include: educational information regarding their diagnosis and treatment, and list of reasons why they would want to return to the  ED prior to their follow-up appointment, should her condition change.     CLINICAL IMPRESSION:    1. Hypoglycemia, resolved        AFTER VISIT PLAN:  1. Discharge home  2. No Rx  3. Follow up with PCP, also given endocrinology  4. Return if sx's worsen     Written by Broadus John, ED Scribe, as dictated by Herma Carson, MD.

## 2013-10-17 NOTE — ED Notes (Signed)
POC Glucose 59.  MD made aware.  Patient provided with 4 ounces of apple juice

## 2013-10-17 NOTE — ED Notes (Signed)
Patient resting supine in bed with continuous monitoring in place.  Side rails up with call bell in reach.  Patient advised of plan of care. Husband at bedside.  Patient to have repeat glucose at 1615

## 2013-10-17 NOTE — ED Notes (Signed)
Patient resting supine in bed with continuous monitoring in place.  Side rails up with call bell in reach.  Patient advised of plan of care.   Husband at bedside.  Patient awaiting lunch from dietary

## 2013-11-24 DIAGNOSIS — F329 Major depressive disorder, single episode, unspecified: Secondary | ICD-10-CM | POA: Diagnosis not present

## 2013-11-24 DIAGNOSIS — F609 Personality disorder, unspecified: Secondary | ICD-10-CM | POA: Diagnosis not present

## 2013-11-24 DIAGNOSIS — F3289 Other specified depressive episodes: Secondary | ICD-10-CM | POA: Diagnosis not present

## 2013-11-25 DIAGNOSIS — H25049 Posterior subcapsular polar age-related cataract, unspecified eye: Secondary | ICD-10-CM | POA: Diagnosis not present

## 2013-12-02 DIAGNOSIS — R937 Abnormal findings on diagnostic imaging of other parts of musculoskeletal system: Secondary | ICD-10-CM | POA: Diagnosis not present

## 2013-12-02 DIAGNOSIS — Y939 Activity, unspecified: Secondary | ICD-10-CM | POA: Diagnosis not present

## 2013-12-02 DIAGNOSIS — Z043 Encounter for examination and observation following other accident: Secondary | ICD-10-CM | POA: Diagnosis not present

## 2013-12-02 DIAGNOSIS — M25569 Pain in unspecified knee: Secondary | ICD-10-CM | POA: Diagnosis not present

## 2013-12-02 DIAGNOSIS — X58XXXA Exposure to other specified factors, initial encounter: Secondary | ICD-10-CM | POA: Diagnosis not present

## 2013-12-02 DIAGNOSIS — S8990XA Unspecified injury of unspecified lower leg, initial encounter: Secondary | ICD-10-CM | POA: Diagnosis not present

## 2013-12-02 DIAGNOSIS — M25579 Pain in unspecified ankle and joints of unspecified foot: Secondary | ICD-10-CM | POA: Diagnosis not present

## 2013-12-02 DIAGNOSIS — M79609 Pain in unspecified limb: Secondary | ICD-10-CM | POA: Diagnosis not present

## 2013-12-02 DIAGNOSIS — R609 Edema, unspecified: Secondary | ICD-10-CM | POA: Diagnosis not present

## 2013-12-02 DIAGNOSIS — E119 Type 2 diabetes mellitus without complications: Secondary | ICD-10-CM | POA: Diagnosis not present

## 2013-12-02 DIAGNOSIS — S93409A Sprain of unspecified ligament of unspecified ankle, initial encounter: Secondary | ICD-10-CM | POA: Diagnosis not present

## 2013-12-02 DIAGNOSIS — S99919A Unspecified injury of unspecified ankle, initial encounter: Secondary | ICD-10-CM | POA: Diagnosis not present

## 2013-12-02 LAB — CBC WITH AUTOMATED DIFF
ABS. BASOPHILS: 0 10*3/uL (ref 0.0–0.06)
ABS. EOSINOPHILS: 0.1 10*3/uL (ref 0.0–0.4)
ABS. LYMPHOCYTES: 1.2 10*3/uL (ref 0.9–3.6)
ABS. MONOCYTES: 0.8 10*3/uL (ref 0.05–1.2)
ABS. NEUTROPHILS: 6.4 10*3/uL (ref 1.8–8.0)
BASOPHILS: 0 % (ref 0–2)
EOSINOPHILS: 1 % (ref 0–5)
HCT: 39.4 % (ref 35.0–45.0)
HGB: 12.9 g/dL (ref 12.0–16.0)
LYMPHOCYTES: 14 % — ABNORMAL LOW (ref 21–52)
MCH: 29.4 PG (ref 24.0–34.0)
MCHC: 32.7 g/dL (ref 31.0–37.0)
MCV: 89.7 FL (ref 74.0–97.0)
MONOCYTES: 9 % (ref 3–10)
MPV: 11.5 FL (ref 9.2–11.8)
NEUTROPHILS: 76 % — ABNORMAL HIGH (ref 40–73)
PLATELET: 254 10*3/uL (ref 135–420)
RBC: 4.39 M/uL (ref 4.20–5.30)
RDW: 13.5 % (ref 11.6–14.5)
WBC: 8.5 10*3/uL (ref 4.6–13.2)

## 2013-12-02 LAB — METABOLIC PANEL, BASIC
Anion gap: 5 mmol/L (ref 3.0–18)
BUN/Creatinine ratio: 17 (ref 12–20)
BUN: 19 MG/DL — ABNORMAL HIGH (ref 7.0–18)
CO2: 31 mmol/L (ref 21–32)
Calcium: 8.8 MG/DL (ref 8.5–10.1)
Chloride: 101 mmol/L (ref 100–108)
Creatinine: 1.13 MG/DL (ref 0.6–1.3)
GFR est AA: >60 mL/min/{1.73_m2} (ref >60)
GFR est non-AA: 54 mL/min/{1.73_m2} — ABNORMAL LOW (ref >60)
Glucose: 282 mg/dL — ABNORMAL HIGH (ref 74–99)
Potassium: 4.3 mmol/L (ref 3.5–5.5)
Sodium: 137 mmol/L (ref 136–145)

## 2013-12-02 LAB — GLUCOSE, POC: Glucose (POC): 254 mg/dL — ABNORMAL HIGH (ref 70–110)

## 2013-12-02 NOTE — ED Notes (Signed)
C/o low blood sugar yesterday afternoon, states she was in the bathroom and started to have "convulsions", states she hurt her legs while she was convulsing. States she finally woke up and was able to get up. States she has had decreased appetite and cold like sx's, denies fever.

## 2013-12-02 NOTE — ED Notes (Signed)
I have reviewed discharge instructions with the patient.  The patient verbalized understanding. Patient armband removed and shredded

## 2013-12-02 NOTE — ED Notes (Signed)
POC glucose=254

## 2013-12-02 NOTE — ED Provider Notes (Signed)
HPI Comments: 7:29 AM  48 y.o. female presents to ED C/O B/L leg pain onset yesterday. Pt states she had low blood sugar yesterday afternoon which resulted in her convulsing, kicking her legs against the floor. Since the incident she says she is having a hard time walking on her feet due to the pain. Pt reports she checked her sugar prior to the incident and it was 140. Then she took her shot of insulin, ate something and her blood sugar suddenly dropped. She managed to make her way to the bedroom and eat something and felt better. Pt has hx of hypoglycemia and knows what it feels like. Pt says that she has had recent cold sxs ( ear pain, rhinorrhea and sore throat) which has attributed to a decreased appetite. Pt denies fevers, diarrhea, constipation or any other sxs or complaints.     LMP- last week; abnormal    The history is provided by the patient. No language interpreter was used.        Past Medical History   Diagnosis Date   ??? Diabetes    ??? Psychiatric disorder      bipolar        Past Surgical History   Procedure Laterality Date   ??? Hx heent       ear surgery   ??? Hx other surgical       cosmetic surgery   ??? Hx orthopaedic       left leg surgery for fx         No family history on file.     History     Social History   ??? Marital Status: MARRIED     Spouse Name: N/A     Number of Children: N/A   ??? Years of Education: N/A     Occupational History   ??? Not on file.     Social History Main Topics   ??? Smoking status: Never Smoker    ??? Smokeless tobacco: Not on file   ??? Alcohol Use: Not on file   ??? Drug Use: Not on file   ??? Sexually Active: Not on file     Other Topics Concern   ??? Not on file     Social History Narrative   ??? No narrative on file                  ALLERGIES: Review of patient's allergies indicates no known allergies.      Review of Systems   Constitutional: Negative.  Negative for fever and chills.   HENT: Positive for ear pain, sore throat and rhinorrhea. Negative for congestion, neck pain and neck  stiffness.    Eyes: Negative for photophobia, pain and discharge.   Respiratory: Negative for chest tightness and shortness of breath.    Cardiovascular: Negative for chest pain and palpitations.   Gastrointestinal: Negative for nausea, abdominal pain and diarrhea.   Genitourinary: Negative for dysuria and frequency.   Musculoskeletal: Positive for myalgias and arthralgias.        B/L leg pain   Skin: Negative for color change and wound.   Neurological: Negative for dizziness and headaches.   Hematological: Negative for adenopathy.   Psychiatric/Behavioral: Negative for behavioral problems.   All other systems reviewed and are negative.        Filed Vitals:    12/02/13 0740   BP: 141/86   Pulse: 97   Temp: 98.4 ??F (36.9 ??C)   Resp: 14  Height: 5\' 2"  (1.575 m)   Weight: 61.236 kg (135 lb)   SpO2: 100%            Physical Exam   Nursing note and vitals reviewed.  Constitutional: She is oriented to person, place, and time. She appears well-developed and well-nourished.   HENT:   Head: Normocephalic and atraumatic.   Eyes: Conjunctivae and EOM are normal. Pupils are equal, round, and reactive to light.   Neck: Normal range of motion. Neck supple.   Cardiovascular: Normal rate, regular rhythm and normal heart sounds.    No murmur heard.  Pulmonary/Chest: Effort normal and breath sounds normal. No respiratory distress.   Abdominal: Soft. Bowel sounds are normal. There is no tenderness. There is no rebound and no guarding.   Musculoskeletal: Normal range of motion. She exhibits tenderness ( Swelling to L lateral malleolus, tenderness to proximal fibula L, tenderness and swelling to R lateral malleolus ). She exhibits no edema.   Lymphadenopathy:     She has no cervical adenopathy.   Neurological: She is alert and oriented to person, place, and time. No cranial nerve deficit.   Skin: Skin is warm and dry. No rash noted. No erythema.   Psychiatric: She has a normal mood and affect. Her behavior is normal.        RESULTS     8:46 AM RADIOLOGY RESULT:  X-Ray shows evidence of questionable avulsion to dorsum of the calcaneus on lateral view R foot, looks old.  Pending review by radiologist.  Written by Duwaine Maxin, ED Scribe, as dictated by Rutherford Guys, M.D..   Labs Reviewed   METABOLIC PANEL, BASIC - Abnormal; Notable for the following:     Glucose 282 (*)     BUN 19 (*)     GFR est non-AA 54 (*)     All other components within normal limits   CBC WITH AUTOMATED DIFF - Abnormal; Notable for the following:     NEUTROPHILS 76 (*)     LYMPHOCYTES 14 (*)     All other components within normal limits   GLUCOSE, POC - Abnormal; Notable for the following:     Glucose (POC) 254 (*)     All other components within normal limits   POC GLUCOSE     Recent Results (from the past 12 hour(s))   GLUCOSE, POC    Collection Time     12/02/13  7:39 AM       Result Value Range    Glucose (POC) 254 (*) 70 - 110 mg/dL   METABOLIC PANEL, BASIC    Collection Time     12/02/13  7:40 AM       Result Value Range    Sodium 137  136 - 145 mmol/L    Potassium 4.3  3.5 - 5.5 mmol/L    Chloride 101  100 - 108 mmol/L    CO2 31  21 - 32 mmol/L    Anion gap 5  3.0 - 18 mmol/L    Glucose 282 (*) 74 - 99 mg/dL    BUN 19 (*) 7.0 - 18 MG/DL    Creatinine 1.61  0.6 - 1.3 MG/DL    BUN/Creatinine ratio 17  12 - 20      GFR est AA >60  >60 ml/min/1.64m2    GFR est non-AA 54 (*) >60 ml/min/1.72m2    Calcium 8.8  8.5 - 10.1 MG/DL   CBC WITH AUTOMATED DIFF    Collection Time  12/02/13  7:40 AM       Result Value Range    WBC 8.5  4.6 - 13.2 K/uL    RBC 4.39  4.20 - 5.30 M/uL    HGB 12.9  12.0 - 16.0 g/dL    HCT 04.5  40.9 - 81.1 %    MCV 89.7  74.0 - 97.0 FL    MCH 29.4  24.0 - 34.0 PG    MCHC 32.7  31.0 - 37.0 g/dL    RDW 91.4  78.2 - 95.6 %    PLATELET 254  135 - 420 K/uL    MPV 11.5  9.2 - 11.8 FL    NEUTROPHILS 76 (*) 40 - 73 %    LYMPHOCYTES 14 (*) 21 - 52 %    MONOCYTES 9  3 - 10 %    EOSINOPHILS 1  0 - 5 %    BASOPHILS 0  0 - 2 %    ABS. NEUTROPHILS 6.4  1.8 - 8.0 K/UL     ABS. LYMPHOCYTES 1.2  0.9 - 3.6 K/UL    ABS. MONOCYTES 0.8  0.05 - 1.2 K/UL    ABS. EOSINOPHILS 0.1  0.0 - 0.4 K/UL    ABS. BASOPHILS 0.0  0.0 - 0.06 K/UL    DF AUTOMATED           Past Medical History   Diagnosis Date   ??? Diabetes    ??? Psychiatric disorder      bipolar      XR ANKLE LT MIN 3 V    (Results Pending)   XR ANKLE RT MIN 3 V    (Results Pending)   XR FOOT RT MIN 3 V    (Results Pending)   XR KNEE LT 3 V    (Canceled)   XR KNEE LT MIN 4 V    (Results Pending)        MDM     Amount and/or Complexity of Data Reviewed:   Clinical lab tests:  Ordered and reviewed  Tests in the radiology section of CPT??:  Ordered and reviewed  Discussion of test results with the performing providers:  No   Decide to obtain previous medical records or to obtain history from someone other than the patient:  No   Obtain history from someone other than the patient:  No   Review and summarize past medical records:  No   Discuss the patient with another provider:  No   Independant visualization of image, tracing, or specimen:  Yes      MEDICATIONS  Medications - No data to display      Procedures    PROGRESS NOTES:    8:44 AM  Pt has been re-examined by Rutherford Guys, MD. Had lengthy discussion with pt and husband about diagnosis. Informed pt we will splint R ankle and ace wrap on L. Pt has crutches and Advil at home. Discussed importance of f/u with PCP for follow-up in reference to blood sugar.  Written by Duwaine Maxin, ED Scribe, as dictated by Rutherford Guys, M.D..        Talmadge Chad  results have been reviewed with her.  She has been counseled regarding her diagnosis, treatment, and plan.  She verbally conveys understanding and agreement of the signs, symptoms, diagnosis, treatment and prognosis and additionally agrees to follow up as discussed.  She also agrees with the care-plan and conveys that all of her questions have been answered.  I have  also provided discharge instructions for her that include: educational  information regarding their diagnosis and treatment, and list of reasons why they would want to return to the ED prior to their follow-up appointment, should her condition change.   ________________________________________________________________________  CLINICAL IMPRESSION    1. Ankle sprain- bilateral           After Visit Plan:  1. Dx'd home  2. F/U with PCP  Return to ED if Sxs worsen.        Written by Ashley Smith, EDuwaine Maxin Scribe, as dictated by Rutherford GuysJohn W. Marline Morace, M.D.

## 2013-12-23 DIAGNOSIS — F319 Bipolar disorder, unspecified: Secondary | ICD-10-CM | POA: Diagnosis not present

## 2013-12-23 DIAGNOSIS — H25049 Posterior subcapsular polar age-related cataract, unspecified eye: Secondary | ICD-10-CM | POA: Diagnosis not present

## 2013-12-23 DIAGNOSIS — E119 Type 2 diabetes mellitus without complications: Secondary | ICD-10-CM | POA: Diagnosis not present

## 2013-12-23 DIAGNOSIS — Z79899 Other long term (current) drug therapy: Secondary | ICD-10-CM | POA: Diagnosis not present

## 2013-12-23 DIAGNOSIS — H269 Unspecified cataract: Secondary | ICD-10-CM | POA: Diagnosis not present

## 2013-12-23 DIAGNOSIS — H25019 Cortical age-related cataract, unspecified eye: Secondary | ICD-10-CM | POA: Diagnosis not present

## 2013-12-30 DIAGNOSIS — H25049 Posterior subcapsular polar age-related cataract, unspecified eye: Secondary | ICD-10-CM | POA: Diagnosis not present

## 2014-01-06 DIAGNOSIS — Z79899 Other long term (current) drug therapy: Secondary | ICD-10-CM | POA: Diagnosis not present

## 2014-01-06 DIAGNOSIS — Z8739 Personal history of other diseases of the musculoskeletal system and connective tissue: Secondary | ICD-10-CM | POA: Diagnosis not present

## 2014-01-06 DIAGNOSIS — E119 Type 2 diabetes mellitus without complications: Secondary | ICD-10-CM | POA: Diagnosis not present

## 2014-01-06 DIAGNOSIS — Z794 Long term (current) use of insulin: Secondary | ICD-10-CM | POA: Diagnosis not present

## 2014-01-06 DIAGNOSIS — H269 Unspecified cataract: Secondary | ICD-10-CM | POA: Diagnosis not present

## 2014-01-06 DIAGNOSIS — H25049 Posterior subcapsular polar age-related cataract, unspecified eye: Secondary | ICD-10-CM | POA: Diagnosis not present

## 2014-01-06 DIAGNOSIS — F319 Bipolar disorder, unspecified: Secondary | ICD-10-CM | POA: Diagnosis not present

## 2014-02-24 DIAGNOSIS — F3289 Other specified depressive episodes: Secondary | ICD-10-CM | POA: Diagnosis not present

## 2014-02-24 DIAGNOSIS — F609 Personality disorder, unspecified: Secondary | ICD-10-CM | POA: Diagnosis not present

## 2014-02-24 DIAGNOSIS — Z0110 Encounter for examination of ears and hearing without abnormal findings: Secondary | ICD-10-CM | POA: Diagnosis not present

## 2014-02-24 DIAGNOSIS — F329 Major depressive disorder, single episode, unspecified: Secondary | ICD-10-CM | POA: Diagnosis not present

## 2014-05-12 DIAGNOSIS — H26499 Other secondary cataract, unspecified eye: Secondary | ICD-10-CM | POA: Diagnosis not present

## 2014-05-13 DIAGNOSIS — F609 Personality disorder, unspecified: Secondary | ICD-10-CM | POA: Diagnosis not present

## 2014-05-13 DIAGNOSIS — F329 Major depressive disorder, single episode, unspecified: Secondary | ICD-10-CM | POA: Diagnosis not present

## 2014-05-13 DIAGNOSIS — F3289 Other specified depressive episodes: Secondary | ICD-10-CM | POA: Diagnosis not present

## 2014-05-21 DIAGNOSIS — H6120 Impacted cerumen, unspecified ear: Secondary | ICD-10-CM | POA: Diagnosis not present

## 2014-06-03 DIAGNOSIS — M1710 Unilateral primary osteoarthritis, unspecified knee: Secondary | ICD-10-CM | POA: Diagnosis not present

## 2014-06-03 DIAGNOSIS — IMO0002 Reserved for concepts with insufficient information to code with codable children: Secondary | ICD-10-CM | POA: Diagnosis not present

## 2014-06-03 DIAGNOSIS — M25569 Pain in unspecified knee: Secondary | ICD-10-CM | POA: Diagnosis not present

## 2014-06-04 DIAGNOSIS — H9210 Otorrhea, unspecified ear: Secondary | ICD-10-CM | POA: Diagnosis not present

## 2014-06-04 DIAGNOSIS — H7200 Central perforation of tympanic membrane, unspecified ear: Secondary | ICD-10-CM | POA: Diagnosis not present

## 2014-06-10 DIAGNOSIS — F329 Major depressive disorder, single episode, unspecified: Secondary | ICD-10-CM | POA: Diagnosis not present

## 2014-06-10 DIAGNOSIS — F3289 Other specified depressive episodes: Secondary | ICD-10-CM | POA: Diagnosis not present

## 2014-06-10 DIAGNOSIS — F609 Personality disorder, unspecified: Secondary | ICD-10-CM | POA: Diagnosis not present

## 2014-06-16 DIAGNOSIS — H04129 Dry eye syndrome of unspecified lacrimal gland: Secondary | ICD-10-CM | POA: Diagnosis not present

## 2014-06-16 DIAGNOSIS — H1045 Other chronic allergic conjunctivitis: Secondary | ICD-10-CM | POA: Diagnosis not present

## 2014-06-24 DIAGNOSIS — M19079 Primary osteoarthritis, unspecified ankle and foot: Secondary | ICD-10-CM | POA: Diagnosis not present

## 2014-06-24 DIAGNOSIS — M216X9 Other acquired deformities of unspecified foot: Secondary | ICD-10-CM | POA: Diagnosis not present

## 2014-06-24 DIAGNOSIS — M25579 Pain in unspecified ankle and joints of unspecified foot: Secondary | ICD-10-CM | POA: Diagnosis not present

## 2014-06-25 DIAGNOSIS — I83893 Varicose veins of bilateral lower extremities with other complications: Secondary | ICD-10-CM | POA: Diagnosis not present

## 2014-06-25 DIAGNOSIS — M79609 Pain in unspecified limb: Secondary | ICD-10-CM | POA: Diagnosis not present

## 2014-06-30 DIAGNOSIS — M25569 Pain in unspecified knee: Secondary | ICD-10-CM | POA: Diagnosis not present

## 2014-07-07 DIAGNOSIS — R7309 Other abnormal glucose: Secondary | ICD-10-CM | POA: Diagnosis not present

## 2014-07-07 DIAGNOSIS — M21619 Bunion of unspecified foot: Secondary | ICD-10-CM | POA: Diagnosis not present

## 2014-07-07 DIAGNOSIS — B351 Tinea unguium: Secondary | ICD-10-CM | POA: Diagnosis not present

## 2014-07-10 DIAGNOSIS — M79609 Pain in unspecified limb: Secondary | ICD-10-CM | POA: Diagnosis not present

## 2014-07-10 DIAGNOSIS — E1149 Type 2 diabetes mellitus with other diabetic neurological complication: Secondary | ICD-10-CM | POA: Diagnosis not present

## 2014-07-10 DIAGNOSIS — M2010 Hallux valgus (acquired), unspecified foot: Secondary | ICD-10-CM | POA: Diagnosis not present

## 2014-07-10 DIAGNOSIS — M21619 Bunion of unspecified foot: Secondary | ICD-10-CM | POA: Diagnosis not present

## 2014-07-14 DIAGNOSIS — F3289 Other specified depressive episodes: Secondary | ICD-10-CM | POA: Diagnosis not present

## 2014-07-14 DIAGNOSIS — F609 Personality disorder, unspecified: Secondary | ICD-10-CM | POA: Diagnosis not present

## 2014-07-14 DIAGNOSIS — F329 Major depressive disorder, single episode, unspecified: Secondary | ICD-10-CM | POA: Diagnosis not present

## 2014-07-17 DIAGNOSIS — F319 Bipolar disorder, unspecified: Secondary | ICD-10-CM | POA: Diagnosis not present

## 2014-07-24 DIAGNOSIS — R262 Difficulty in walking, not elsewhere classified: Secondary | ICD-10-CM | POA: Diagnosis not present

## 2014-07-24 DIAGNOSIS — B351 Tinea unguium: Secondary | ICD-10-CM | POA: Diagnosis not present

## 2014-07-24 DIAGNOSIS — M79609 Pain in unspecified limb: Secondary | ICD-10-CM | POA: Diagnosis not present

## 2014-07-27 DIAGNOSIS — L600 Ingrowing nail: Secondary | ICD-10-CM | POA: Diagnosis not present

## 2014-07-27 DIAGNOSIS — M7750 Other enthesopathy of unspecified foot: Secondary | ICD-10-CM | POA: Diagnosis not present

## 2014-07-27 DIAGNOSIS — M21619 Bunion of unspecified foot: Secondary | ICD-10-CM | POA: Diagnosis not present

## 2014-07-27 DIAGNOSIS — M79609 Pain in unspecified limb: Secondary | ICD-10-CM | POA: Diagnosis not present

## 2014-07-29 DIAGNOSIS — L259 Unspecified contact dermatitis, unspecified cause: Secondary | ICD-10-CM | POA: Diagnosis not present

## 2014-08-12 DIAGNOSIS — H902 Conductive hearing loss, unspecified: Secondary | ICD-10-CM | POA: Diagnosis not present

## 2014-08-12 DIAGNOSIS — H7290 Unspecified perforation of tympanic membrane, unspecified ear: Secondary | ICD-10-CM | POA: Diagnosis not present

## 2014-08-12 DIAGNOSIS — H905 Unspecified sensorineural hearing loss: Secondary | ICD-10-CM | POA: Diagnosis not present

## 2014-08-12 DIAGNOSIS — H908 Mixed conductive and sensorineural hearing loss, unspecified: Secondary | ICD-10-CM | POA: Diagnosis not present

## 2014-09-02 DIAGNOSIS — F603 Borderline personality disorder: Secondary | ICD-10-CM | POA: Diagnosis not present

## 2014-09-02 DIAGNOSIS — F319 Bipolar disorder, unspecified: Secondary | ICD-10-CM | POA: Diagnosis not present

## 2014-09-15 DIAGNOSIS — J309 Allergic rhinitis, unspecified: Secondary | ICD-10-CM | POA: Diagnosis not present

## 2014-09-22 DIAGNOSIS — R930 Abnormal findings on diagnostic imaging of skull and head, not elsewhere classified: Secondary | ICD-10-CM | POA: Diagnosis not present

## 2014-09-22 DIAGNOSIS — Z9889 Other specified postprocedural states: Secondary | ICD-10-CM | POA: Diagnosis not present

## 2014-09-22 DIAGNOSIS — H9190 Unspecified hearing loss, unspecified ear: Secondary | ICD-10-CM | POA: Diagnosis not present

## 2014-09-23 DIAGNOSIS — K069 Disorder of gingiva and edentulous alveolar ridge, unspecified: Secondary | ICD-10-CM | POA: Diagnosis not present

## 2014-10-30 ENCOUNTER — Ambulatory Visit: Payer: Self-pay | Admitting: Physician Assistant

## 2014-11-02 ENCOUNTER — Ambulatory Visit (INDEPENDENT_AMBULATORY_CARE_PROVIDER_SITE_OTHER): Payer: Medicare Other | Admitting: Physician Assistant

## 2014-11-02 ENCOUNTER — Encounter: Payer: Self-pay | Admitting: Physician Assistant

## 2014-11-02 VITALS — BP 113/69 | HR 87 | Ht 62.0 in | Wt 141.0 lb

## 2014-11-02 DIAGNOSIS — E1043 Type 1 diabetes mellitus with diabetic autonomic (poly)neuropathy: Secondary | ICD-10-CM

## 2014-11-02 DIAGNOSIS — K219 Gastro-esophageal reflux disease without esophagitis: Secondary | ICD-10-CM | POA: Diagnosis not present

## 2014-11-02 DIAGNOSIS — G990 Autonomic neuropathy in diseases classified elsewhere: Secondary | ICD-10-CM | POA: Diagnosis not present

## 2014-11-02 DIAGNOSIS — F3112 Bipolar disorder, current episode manic without psychotic features, moderate: Secondary | ICD-10-CM | POA: Diagnosis not present

## 2014-11-02 DIAGNOSIS — E1042 Type 1 diabetes mellitus with diabetic polyneuropathy: Secondary | ICD-10-CM | POA: Diagnosis not present

## 2014-11-02 MED ORDER — AMMONIUM LACTATE 12 % EX CREA: TOPICAL_CREAM | CUTANEOUS | Status: DC | PRN

## 2014-11-02 MED ORDER — FEXOFENADINE HCL 180 MG PO TABS: 180.0000 mg | ORAL_TABLET | Freq: Every day | ORAL | Status: DC

## 2014-11-02 MED ORDER — FLUOXETINE HCL 20 MG PO CAPS: 60.0000 mg | ORAL_CAPSULE | Freq: Every day | ORAL | Status: DC

## 2014-11-02 MED ORDER — BUPROPION HCL ER (XL) 150 MG PO TB24: 150.0000 mg | ORAL_TABLET | Freq: Every day | ORAL | Status: DC

## 2014-11-02 MED ORDER — ESOMEPRAZOLE MAGNESIUM 40 MG PO CPDR: 40.0000 mg | DELAYED_RELEASE_CAPSULE | Freq: Every day | ORAL | Status: DC

## 2014-11-02 MED ORDER — GABAPENTIN 300 MG PO CAPS: 300.0000 mg | ORAL_CAPSULE | Freq: Every day | ORAL | Status: DC

## 2014-11-02 NOTE — Patient Instructions (Signed)
Referral for psychiatrist.  Referral for diabetic specialist.  Referral podiatrist([facility])

## 2014-11-05 MED ORDER — GLUCOSE BLOOD VI STRP: ORAL_STRIP | Status: DC

## 2014-11-06 ENCOUNTER — Other Ambulatory Visit: Payer: Self-pay | Admitting: Physician Assistant

## 2014-11-06 ENCOUNTER — Telehealth: Payer: Self-pay | Admitting: *Deleted

## 2014-11-06 MED ORDER — INSULIN REGULAR HUMAN 100 UNIT/ML IJ SOLN: INTRAMUSCULAR | Status: DC

## 2014-11-06 MED ORDER — HYDROCODONE-HOMATROPINE 5-1.5 MG/5ML PO SYRP: 5.0000 mL | ORAL_SOLUTION | Freq: Every evening | ORAL | Status: DC | PRN

## 2014-11-06 MED ORDER — INSULIN NPH (HUMAN) (ISOPHANE) 100 UNIT/ML ~~LOC~~ SUSP: SUBCUTANEOUS | Status: DC

## 2014-11-06 NOTE — Telephone Encounter (Signed)
Sheryl Matthews called regarding her antibiotic but then also was asking about a cough syrup. She said that she is now more congested and the dayquil she has been using is not helping. What can she use. She said that she is a diabetic. Please advise. Margette Fast, CMA

## 2014-11-06 NOTE — Telephone Encounter (Signed)
Voicemail left for patient that script would be at front desk. Margette Fast, CMA

## 2014-11-06 NOTE — Telephone Encounter (Signed)
Will print off cough syrup to come by and print off for bedtime use.

## 2014-11-09 ENCOUNTER — Encounter: Payer: Self-pay | Admitting: Physician Assistant

## 2014-11-09 NOTE — Progress Notes (Signed)
   Subjective:    Patient ID: Sheryl Matthews, female    DOB: 01-09-1966, 48 y.o.   MRN: [id number]  HPI Patient is a 48 year old female who presents to the clinic to establish care.  Patient does have a past medical history for type 1 diabetes, bipolar 1 disorder with moderate mania and GERD.  Marland Kitchen.No family history on file. .. History   Social History  . Marital Status: Married    Spouse Name: N/A    Number of Children: N/A  . Years of Education: N/A   Occupational History  . Not on file.   Social History Main Topics  . Smoking status: Never Smoker   . Smokeless tobacco: Not on file  . Alcohol Use: Not on file  . Drug Use: Not on file  . Sexual Activity: Not on file   Other Topics Concern  . Not on file   Social History Narrative  . No narrative on file   Patient needs refills on medications.  She is a type I diabetic. Her sugars have been uncontrolled fasting as high as 200 in the morning. She also takes gabapentin for peripheral neuropathy.  Patient also has been diagnosed with bipolar 1. She does feel more down lately. She is on Prozac and Wellbutrin. She denies any suicidal or homicidal thoughts.   Review of Systems  All other systems reviewed and are negative.      Objective:   Physical Exam  Constitutional: She is oriented to person, place, and time. She appears well-developed and well-nourished.  HENT:  Head: Normocephalic and atraumatic.  Right Ear: External ear normal.  Left Ear: External ear normal.  TMs are clear bilaterally.  There was some sinus tenderness to palpation bilaterally.  Nasal turbinates are red and swollen.  Eyes: Conjunctivae are normal. Right eye exhibits no discharge. Left eye exhibits no discharge.  Neck: Normal range of motion. Neck supple.  Cardiovascular: Normal rate, regular rhythm and normal heart sounds.   Pulmonary/Chest: Effort normal and breath sounds normal. She has no wheezes.  Lymphadenopathy:    She has no cervical  adenopathy.  Neurological: She is alert and oriented to person, place, and time.  Psychiatric: She has a normal mood and affect. Her behavior is normal.          Assessment & Plan:  Diabetes type I with peripheral neuropathy- will refer to endocrinology for management. Patient is on an usual insulin dosing schedule and reports she does not do well with long-acting insulins. Also refilled her gabapentin 300 mg daily. Referral made to endocrinology.  Bipolar1 with moderate mania-will refer to psychiatry for management. Went ahead and refilled her Prozac and Wellbutrin as well as NX for today.  GERD-Nexium 40 mg once daily refilled.  Acute sinusitis-hycodan for cough as well as zpak sent for 5 days. Follow up as needed.

## 2014-12-02 ENCOUNTER — Ambulatory Visit: Payer: Medicare Other | Admitting: Endocrinology

## 2014-12-07 DIAGNOSIS — H9392 Unspecified disorder of left ear: Secondary | ICD-10-CM | POA: Diagnosis not present

## 2014-12-07 DIAGNOSIS — J302 Other seasonal allergic rhinitis: Secondary | ICD-10-CM | POA: Diagnosis not present

## 2014-12-08 DIAGNOSIS — E109 Type 1 diabetes mellitus without complications: Secondary | ICD-10-CM | POA: Diagnosis not present

## 2014-12-08 DIAGNOSIS — Z961 Presence of intraocular lens: Secondary | ICD-10-CM | POA: Diagnosis not present

## 2014-12-08 DIAGNOSIS — H527 Unspecified disorder of refraction: Secondary | ICD-10-CM | POA: Diagnosis not present

## 2014-12-11 ENCOUNTER — Ambulatory Visit (HOSPITAL_COMMUNITY): Payer: Medicare Other | Admitting: Physician Assistant

## 2014-12-11 DIAGNOSIS — M5412 Radiculopathy, cervical region: Secondary | ICD-10-CM | POA: Diagnosis not present

## 2014-12-11 DIAGNOSIS — M542 Cervicalgia: Secondary | ICD-10-CM | POA: Diagnosis not present

## 2014-12-14 ENCOUNTER — Ambulatory Visit: Payer: Medicare Other | Admitting: Physician Assistant

## 2014-12-15 ENCOUNTER — Ambulatory Visit (HOSPITAL_COMMUNITY): Payer: Medicare Other | Admitting: Physician Assistant

## 2014-12-15 DIAGNOSIS — M2011 Hallux valgus (acquired), right foot: Secondary | ICD-10-CM | POA: Diagnosis not present

## 2014-12-15 DIAGNOSIS — M2042 Other hammer toe(s) (acquired), left foot: Secondary | ICD-10-CM | POA: Diagnosis not present

## 2014-12-15 DIAGNOSIS — M2041 Other hammer toe(s) (acquired), right foot: Secondary | ICD-10-CM | POA: Diagnosis not present

## 2014-12-15 DIAGNOSIS — M2012 Hallux valgus (acquired), left foot: Secondary | ICD-10-CM | POA: Diagnosis not present

## 2014-12-18 ENCOUNTER — Ambulatory Visit (HOSPITAL_COMMUNITY): Payer: Medicare Other | Admitting: Physician Assistant

## 2014-12-21 ENCOUNTER — Ambulatory Visit: Payer: Medicare Other | Admitting: Physician Assistant

## 2014-12-22 ENCOUNTER — Encounter: Payer: Self-pay | Admitting: Physician Assistant

## 2014-12-22 DIAGNOSIS — H9392 Unspecified disorder of left ear: Secondary | ICD-10-CM | POA: Diagnosis not present

## 2014-12-22 DIAGNOSIS — E113299 Type 2 diabetes mellitus with mild nonproliferative diabetic retinopathy without macular edema, unspecified eye: Secondary | ICD-10-CM | POA: Insufficient documentation

## 2014-12-22 DIAGNOSIS — Z01818 Encounter for other preprocedural examination: Secondary | ICD-10-CM | POA: Diagnosis not present

## 2014-12-22 DIAGNOSIS — Z961 Presence of intraocular lens: Secondary | ICD-10-CM | POA: Insufficient documentation

## 2014-12-25 ENCOUNTER — Ambulatory Visit (HOSPITAL_COMMUNITY): Payer: Medicare Other | Admitting: Physician Assistant

## 2014-12-25 ENCOUNTER — Encounter (INDEPENDENT_AMBULATORY_CARE_PROVIDER_SITE_OTHER): Payer: Self-pay

## 2014-12-25 ENCOUNTER — Ambulatory Visit (INDEPENDENT_AMBULATORY_CARE_PROVIDER_SITE_OTHER): Payer: Medicare Other | Admitting: Physician Assistant

## 2014-12-25 ENCOUNTER — Encounter (HOSPITAL_COMMUNITY): Payer: Self-pay | Admitting: Physician Assistant

## 2014-12-25 VITALS — Ht 62.0 in | Wt 141.0 lb

## 2014-12-25 DIAGNOSIS — F310 Bipolar disorder, current episode hypomanic: Secondary | ICD-10-CM

## 2014-12-25 DIAGNOSIS — R45851 Suicidal ideations: Secondary | ICD-10-CM

## 2014-12-25 MED ORDER — BUPROPION HCL ER (XL) 150 MG PO TB24: 150.0000 mg | ORAL_TABLET | Freq: Every day | ORAL | Status: DC

## 2014-12-25 MED ORDER — LAMOTRIGINE 25 MG PO TABS: 25.0000 mg | ORAL_TABLET | Freq: Every day | ORAL | Status: DC

## 2014-12-25 MED ORDER — FLUOXETINE HCL 20 MG PO CAPS: 60.0000 mg | ORAL_CAPSULE | Freq: Every day | ORAL | Status: DC
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
## 2014-12-25 NOTE — Patient Instructions (Addendum)
1. Continue all medication as ordered. 2. Call this office if you have any questions or concerns. 3. Continue to get regular exercise 3-5 times a week. 4. Continue to eat a healthy nutritionally balanced diet. 5. Continue to reduce stress and anxiety through activities such as yoga, mindfulness, meditation and or prayer. 6. Keep all appointments with your out patient therapist and have notes forwarded to this office. (If you do not have one and would like to be scheduled with a therapist, please let our office assist you with this. 7. Follow up as planned 2 weeks. 8. Add Vitamin D3 and Bcomplex each day.

## 2014-12-31 ENCOUNTER — Encounter (INDEPENDENT_AMBULATORY_CARE_PROVIDER_SITE_OTHER): Payer: Self-pay

## 2014-12-31 ENCOUNTER — Ambulatory Visit (INDEPENDENT_AMBULATORY_CARE_PROVIDER_SITE_OTHER): Payer: Medicare Other | Admitting: Licensed Clinical Social Worker

## 2014-12-31 DIAGNOSIS — F3130 Bipolar disorder, current episode depressed, mild or moderate severity, unspecified: Secondary | ICD-10-CM
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
## 2015-01-05 ENCOUNTER — Ambulatory Visit (HOSPITAL_COMMUNITY): Payer: Self-pay | Admitting: Licensed Clinical Social Worker

## 2015-01-07 DIAGNOSIS — L239 Allergic contact dermatitis, unspecified cause: Secondary | ICD-10-CM | POA: Diagnosis not present

## 2015-01-08 ENCOUNTER — Ambulatory Visit (HOSPITAL_COMMUNITY): Payer: Self-pay | Admitting: Physician Assistant

## 2015-01-15 ENCOUNTER — Ambulatory Visit (INDEPENDENT_AMBULATORY_CARE_PROVIDER_SITE_OTHER): Payer: Medicare Other | Admitting: Physician Assistant

## 2015-01-15 ENCOUNTER — Encounter (HOSPITAL_COMMUNITY): Payer: Self-pay | Admitting: Physician Assistant

## 2015-01-15 VITALS — BP 121/82 | HR 90 | Ht 62.0 in | Wt 139.0 lb

## 2015-01-15 DIAGNOSIS — F3130 Bipolar disorder, current episode depressed, mild or moderate severity, unspecified: Secondary | ICD-10-CM

## 2015-01-15 DIAGNOSIS — F3110 Bipolar disorder, current episode manic without psychotic features, unspecified: Secondary | ICD-10-CM | POA: Diagnosis not present

## 2015-01-15 DIAGNOSIS — F310 Bipolar disorder, current episode hypomanic: Secondary | ICD-10-CM

## 2015-01-15 MED ORDER — FLUOXETINE HCL 20 MG PO CAPS: 60.0000 mg | ORAL_CAPSULE | Freq: Every day | ORAL | Status: DC

## 2015-01-15 MED ORDER — LAMOTRIGINE 25 MG PO TABS
25.0000 mg | ORAL_TABLET | Freq: Every day | ORAL | Status: DC
Start: 2015-01-15 — End: 2015-06-07

## 2015-01-15 MED ORDER — BUPROPION HCL ER (XL) 150 MG PO TB24: 150.0000 mg | ORAL_TABLET | Freq: Every day | ORAL | Status: DC

## 2015-01-15 NOTE — Patient Instructions (Signed)
1. Continue all medication as ordered. 2. Call this office if you have any questions or concerns. 3. Continue to get regular exercise 3-5 times a week. 4. Continue to eat a healthy nutritionally balanced diet. 5. Continue to reduce stress and anxiety through activities such as yoga, mindfulness, meditation and or prayer. 6. Keep all appointments with your out patient therapist and have notes forwarded to this office. (If you do not have one and would like to be scheduled with a therapist, please let our office assist you with this. 7. Follow up as planned. 6 weeks.

## 2015-01-15 NOTE — Progress Notes (Signed)
[facility] [phone] Progress Note  Sheryl Matthews [id number] 49 y.o.  01/15/2015 3:42 PM  Chief Complaint:  Bipolar disorder MRE manic History of Present Illness: In the interim Keoni states she is better, she notes that she doesn't feel as depressed and now wants to live life again. She is also working out. She is taking the Lamictal and reports no side effects other than seeing colors more vividly. She reports getting about 12 hours off sleep and she prefers that.   Suicidal Ideation: No Plan Formed: No Patient has means to carry out plan: No  Homicidal Ideation: No Plan Formed: No Patient has means to carry out plan: No  Review of Systems: Psychiatric: Agitation: No Hallucination: No Depressed Mood: No Insomnia: No Hypersomnia: No Altered Concentration: No Feels Worthless: No Grandiose Ideas: No Belief In Special Powers: No New/Increased Substance Abuse: No Compulsions: No  Neurologic: Headache: No Seizure: No Paresthesias: No  Past Medical Family, Social History: Husband is finishing his deployment and will be retirement next week. She is glad that he is home, but also, a little anxious about it as well.  Outpatient Encounter Prescriptions as of 01/15/2015  Medication Sig  . buPROPion (WELLBUTRIN XL) 150 MG 24 hr tablet Take 1 tablet (150 mg total) by mouth daily.  . Emollient (VANICREAM EX) Apply topically as needed.  Marland Kitchen esomeprazole (NEXIUM) 40 MG capsule Take 1 capsule (40 mg total) by mouth daily at 12 noon.  . fexofenadine (ALLEGRA) 180 MG tablet Take 180 mg by mouth.  Marland Kitchen FLUoxetine (PROZAC) 20 MG capsule Take 3 capsules (60 mg total) by mouth daily.  . fluticasone (FLONASE) 50 MCG/ACT nasal spray Place 1 spray into the nose.  Marland Kitchen glucose blood (FREESTYLE LITE) test strip Use as instructed  . insulin aspart (NOVOLOG) 100 UNIT/ML injection Inject into the skin.  Marland Kitchen insulin NPH Human (HUMULIN N,NOVOLIN N) 100 UNIT/ML injection Inject 32 units every morning  and 18 units every evening  . insulin regular (NOVOLIN R,HUMULIN R) 100 units/mL injection Inject 10 units every morning and 4 units every evening  . lamoTRIgine (LAMICTAL) 25 MG tablet Take 1 tablet (25 mg total) by mouth daily.  . Multiple Vitamins-Minerals (HAIR VITAMINS PO) Take by mouth daily.  . naproxen (NAPROSYN) 500 MG tablet Take 500 mg by mouth.  . ALPRAZolam (XANAX) 0.25 MG tablet Take 0.25 mg by mouth as needed for anxiety.  . gabapentin (NEURONTIN) 100 MG capsule Take by mouth.  . [DISCONTINUED] ammonium lactate (LAC-HYDRIN) 12 % cream Apply topically as needed for dry skin. (Patient not taking: Reported on 01/15/2015)    Past Psychiatric History/Hospitalization(s): Anxiety: No Bipolar Disorder: Yes Depression: No Mania: No Psychosis: No Schizophrenia: No Personality Disorder: No Hospitalization for psychiatric illness: Yes History of Electroconvulsive Shock Therapy: No Prior Suicide Attempts: Yes  6 months ago OD'd no hospitalization  Physical Exam: Constitutional:  BP 121/82 mmHg  Pulse 90  Ht 5\' 2"  (1.575 m)  Wt 139 lb (63.05 kg)  BMI 25.42 kg/m2  General Appearance: alert, oriented, no acute distress  Musculoskeletal: Strength & Muscle Tone: within normal limits Gait & Station: normal Patient leans: N/A  Psychiatric: Speech (describe rate, volume, coherence, spontaneity, and abnormalities if any): clear and goal directed, normal rate volume and rhythm  Thought Process (describe rate, content, abstract reasoning, and computation): normal  Associations: Coherent and Relevant  Thoughts: normal  Mental Status: Orientation: oriented to person, place and time/date Mood & Affect: normal affect Attention Span & Concentration: good  Medical  Decision Making (Choose Three): Established Problem, Stable/Improving (1)  Assessment: Axis I: Bipolar MRE manic stabilizing.  Plan:  1. Continue medication as written. 2. Patient will d/c Neurontin as  discussed. 3. She will continue with Francine Graven for OPT. 4. Follow up in 1 month. Sonal Dorwart, PA-C 01/15/2015

## 2015-01-18 DIAGNOSIS — M47812 Spondylosis without myelopathy or radiculopathy, cervical region: Secondary | ICD-10-CM | POA: Diagnosis not present

## 2015-01-18 DIAGNOSIS — M5412 Radiculopathy, cervical region: Secondary | ICD-10-CM | POA: Diagnosis not present

## 2015-01-18 DIAGNOSIS — M5022 Other cervical disc displacement, mid-cervical region: Secondary | ICD-10-CM | POA: Diagnosis not present

## 2015-01-18 DIAGNOSIS — H9392 Unspecified disorder of left ear: Secondary | ICD-10-CM | POA: Diagnosis not present

## 2015-01-27 DIAGNOSIS — I7300 Raynaud's syndrome without gangrene: Secondary | ICD-10-CM | POA: Diagnosis not present

## 2015-01-29 ENCOUNTER — Ambulatory Visit (HOSPITAL_COMMUNITY): Payer: Self-pay | Admitting: Licensed Clinical Social Worker

## 2015-01-29 DIAGNOSIS — M79662 Pain in left lower leg: Secondary | ICD-10-CM | POA: Diagnosis not present

## 2015-01-29 DIAGNOSIS — M25572 Pain in left ankle and joints of left foot: Secondary | ICD-10-CM | POA: Diagnosis not present

## 2015-02-02 DIAGNOSIS — Z8781 Personal history of (healed) traumatic fracture: Secondary | ICD-10-CM | POA: Diagnosis not present

## 2015-02-02 DIAGNOSIS — M79605 Pain in left leg: Secondary | ICD-10-CM | POA: Diagnosis not present

## 2015-02-02 DIAGNOSIS — R937 Abnormal findings on diagnostic imaging of other parts of musculoskeletal system: Secondary | ICD-10-CM | POA: Diagnosis not present

## 2015-02-05 DIAGNOSIS — T8484XA Pain due to internal orthopedic prosthetic devices, implants and grafts, initial encounter: Secondary | ICD-10-CM | POA: Diagnosis not present

## 2015-02-05 DIAGNOSIS — M79662 Pain in left lower leg: Secondary | ICD-10-CM | POA: Diagnosis not present

## 2015-02-05 DIAGNOSIS — M542 Cervicalgia: Secondary | ICD-10-CM | POA: Diagnosis not present

## 2015-02-08 DIAGNOSIS — M1990 Unspecified osteoarthritis, unspecified site: Secondary | ICD-10-CM | POA: Diagnosis not present

## 2015-02-08 DIAGNOSIS — M2550 Pain in unspecified joint: Secondary | ICD-10-CM | POA: Diagnosis not present

## 2015-02-08 DIAGNOSIS — M797 Fibromyalgia: Secondary | ICD-10-CM | POA: Diagnosis not present

## 2015-02-08 DIAGNOSIS — M19042 Primary osteoarthritis, left hand: Secondary | ICD-10-CM | POA: Diagnosis not present

## 2015-02-08 DIAGNOSIS — M19041 Primary osteoarthritis, right hand: Secondary | ICD-10-CM | POA: Diagnosis not present

## 2015-02-09 DIAGNOSIS — H9012 Conductive hearing loss, unilateral, left ear, with unrestricted hearing on the contralateral side: Secondary | ICD-10-CM | POA: Diagnosis not present

## 2015-02-16 ENCOUNTER — Ambulatory Visit (HOSPITAL_COMMUNITY): Payer: Self-pay | Admitting: Physician Assistant

## 2015-02-22 DIAGNOSIS — M1990 Unspecified osteoarthritis, unspecified site: Secondary | ICD-10-CM | POA: Diagnosis not present

## 2015-02-22 DIAGNOSIS — M2550 Pain in unspecified joint: Secondary | ICD-10-CM | POA: Diagnosis not present

## 2015-02-22 DIAGNOSIS — F419 Anxiety disorder, unspecified: Secondary | ICD-10-CM | POA: Diagnosis not present

## 2015-02-22 DIAGNOSIS — M797 Fibromyalgia: Secondary | ICD-10-CM | POA: Diagnosis not present

## 2015-04-15 ENCOUNTER — Other Ambulatory Visit: Payer: Self-pay | Admitting: Physician Assistant

## 2015-04-26 ENCOUNTER — Other Ambulatory Visit: Payer: Self-pay | Admitting: Physician Assistant

## 2015-05-13 DIAGNOSIS — H9392 Unspecified disorder of left ear: Secondary | ICD-10-CM | POA: Diagnosis not present

## 2015-05-18 ENCOUNTER — Telehealth: Payer: Self-pay | Admitting: Family Medicine

## 2015-05-18 ENCOUNTER — Other Ambulatory Visit: Payer: Self-pay | Admitting: Physician Assistant

## 2015-05-18 NOTE — Telephone Encounter (Signed)
Done

## 2015-05-18 NOTE — Telephone Encounter (Signed)
Amber, Will you please let patient know that we received a request for a Rx of alprazolam and hydroxyzine from [facility].  Can you please forward the alprazolam request (in the PA box) to behavioral health who she sees downstairs and hydroxyzine has never been filled by our office so that would require an office visit.

## 2015-05-19 ENCOUNTER — Other Ambulatory Visit: Payer: Self-pay | Admitting: Family Medicine

## 2015-05-19 NOTE — Telephone Encounter (Signed)
Kelsi please see the phone note from the other day regarding that behavioral health should make the decision on whether refill is appropriate.

## 2015-05-19 NOTE — Telephone Encounter (Signed)
[facility] requesting Rx for Alprazolam 0.5mg  tabs, 90 day supply. This Rx was entered as a historical provider. Will route to Dr. Ileene Rubens ( has upcoming appt with Dr. Ileene Rubens) for review.

## 2015-05-19 NOTE — Telephone Encounter (Signed)
Done

## 2015-05-19 NOTE — Telephone Encounter (Signed)
Amber, The [facility] from [facility] was sent back to our office from [facility].  Alprazolam is a psychiatric medication, can you please let them know that intentionally am wanting them to address this since they are managing her psychiatric medications, paper in your inbox.

## 2015-05-21 ENCOUNTER — Ambulatory Visit: Payer: Self-pay | Admitting: Family Medicine

## 2015-05-27 ENCOUNTER — Other Ambulatory Visit: Payer: Self-pay

## 2015-05-27 MED ORDER — INSULIN REGULAR HUMAN 100 UNIT/ML IJ SOLN: INTRAMUSCULAR | Status: DC

## 2015-05-28 ENCOUNTER — Ambulatory Visit: Payer: Self-pay | Admitting: Family Medicine

## 2015-06-01 ENCOUNTER — Other Ambulatory Visit: Payer: Self-pay | Admitting: Physician Assistant

## 2015-06-01 ENCOUNTER — Ambulatory Visit: Payer: Self-pay | Admitting: Sports Medicine

## 2015-06-05 ENCOUNTER — Other Ambulatory Visit: Payer: Self-pay | Admitting: Physician Assistant

## 2015-06-07 ENCOUNTER — Ambulatory Visit (INDEPENDENT_AMBULATORY_CARE_PROVIDER_SITE_OTHER): Payer: Medicare Other | Admitting: Family Medicine

## 2015-06-07 ENCOUNTER — Encounter: Payer: Self-pay | Admitting: Family Medicine

## 2015-06-07 VITALS — BP 101/67 | HR 81 | Ht 62.0 in | Wt 144.0 lb

## 2015-06-07 DIAGNOSIS — G990 Autonomic neuropathy in diseases classified elsewhere: Secondary | ICD-10-CM

## 2015-06-07 DIAGNOSIS — R1032 Left lower quadrant pain: Secondary | ICD-10-CM | POA: Diagnosis not present

## 2015-06-07 DIAGNOSIS — E1042 Type 1 diabetes mellitus with diabetic polyneuropathy: Secondary | ICD-10-CM | POA: Diagnosis not present

## 2015-06-07 DIAGNOSIS — R109 Unspecified abdominal pain: Secondary | ICD-10-CM

## 2015-06-07 DIAGNOSIS — E1043 Type 1 diabetes mellitus with diabetic autonomic (poly)neuropathy: Secondary | ICD-10-CM

## 2015-06-07 DIAGNOSIS — Z79899 Other long term (current) drug therapy: Secondary | ICD-10-CM | POA: Diagnosis not present

## 2015-06-07 LAB — POCT URINALYSIS DIPSTICK
Bilirubin, UA: NEGATIVE
Glucose, UA: 500
Ketones, UA: NEGATIVE
LEUKOCYTES UA: NEGATIVE
NITRITE UA: NEGATIVE
PH UA: 6.5
Protein, UA: NEGATIVE
RBC UA: NEGATIVE
Spec Grav, UA: 1.02
UROBILINOGEN UA: NEGATIVE

## 2015-06-07 MED ORDER — INSULIN NPH (HUMAN) (ISOPHANE) 100 UNIT/ML ~~LOC~~ SUSP: SUBCUTANEOUS | Status: DC

## 2015-06-07 MED ORDER — GLUCOSE BLOOD VI STRP: ORAL_STRIP | Status: DC

## 2015-06-07 NOTE — Assessment & Plan Note (Signed)
Unclear etiology of left lower quadrant abdominal pain. Unfortunately my exam and workup is limited by patient refusing a pelvic exam. She elects to follow-up with her primary care provider for this in the near future. We will obtain CBC, CMP, and lipase. Urinalysis reviewed. Mild glucose.

## 2015-06-07 NOTE — Progress Notes (Signed)
Sheryl Matthews is a 49 y.o. female who presents to [facility]  today for abdominal pain. Patient has a two-week history of left lower quadrant abdominal pain. The pain is somewhat related to food but possibly not. She denies any urinary frequency urgency dysuria diarrhea or constipation. She notes normal clear to whitish vaginal discharge that has not changed. She feels well without any fevers or chills. No vomiting noted. Pain is mild to moderate. She has tried taking her Nexium which has not helped much.   The patient is here also today to refill her diabetes medications. She is doing well with her current regimen.  Past Medical History  Diagnosis Date  . Diabetes mellitus without complication    No past surgical history on file. History  Substance Use Topics  . Smoking status: Never Smoker   . Smokeless tobacco: Never Used  . Alcohol Use: No   ROS as above Medications: Current Outpatient Prescriptions  Medication Sig Dispense Refill  . ALPRAZolam (XANAX) 0.25 MG tablet Take 0.25 mg by mouth as needed for anxiety.    Marland Kitchen buPROPion (WELLBUTRIN XL) 150 MG 24 hr tablet Take 1 tablet (150 mg total) by mouth daily. 90 tablet 1  . Emollient (VANICREAM EX) Apply topically as needed.    . fexofenadine (ALLEGRA) 180 MG tablet Take 180 mg by mouth.    Marland Kitchen FLUoxetine (PROZAC) 20 MG capsule Take 3 capsules (60 mg total) by mouth daily. 270 capsule 1  . fluticasone (FLONASE) 50 MCG/ACT nasal spray Place 1 spray into the nose.    . gabapentin (NEURONTIN) 300 MG capsule TAKE 1 CAPSULE DAILY 90 capsule 0  . glucose blood (FREESTYLE LITE) test strip Use as instructed 100 each 12  . insulin NPH Human (HUMULIN N,NOVOLIN N) 100 UNIT/ML injection Inject 32 units every morning and 18 units every evening 30 mL 1  . insulin regular (NOVOLIN R,HUMULIN R) 100 units/mL injection Inject 10 units every morning and 4 units every evening 30 mL 2  . Multiple Vitamins-Minerals (HAIR  VITAMINS PO) Take by mouth daily.    . naproxen (NAPROSYN) 500 MG tablet Take 500 mg by mouth.    Marland Kitchen NEXIUM 40 MG capsule TAKE 1 CAPSULE DAILY AT 12 NOON 90 capsule 0   No current facility-administered medications for this visit.   Allergies  Allergen Reactions  . Latex Rash  . Tape Rash     Exam:  BP 101/67 mmHg  Pulse 81  Ht 5\' 2"  (1.575 m)  Wt 144 lb (65.318 kg)  BMI 26.33 kg/m2 Gen: Well NAD HEENT: EOMI,  MMM Lungs: Normal work of breathing. CTABL Heart: RRR no MRG Abd: NABS, Soft. Nondistended, mildly tender left lower quadrant without rebound or guarding. Exts: Brisk capillary refill, warm and well perfused.  Patient declines pelvic exam  Results for orders placed or performed in visit on 06/07/15 (from the past 24 hour(s))  POCT Urinalysis Dipstick     Status: Abnormal   Collection Time: 06/07/15  1:54 PM  Result Value Ref Range   Color, UA yellow    Clarity, UA clear    Glucose, UA 500    Bilirubin, UA neg    Ketones, UA neg    Spec Grav, UA 1.020    Blood, UA neg    pH, UA 6.5    Protein, UA neg    Urobilinogen, UA negative    Nitrite, UA neg    Leukocytes, UA Negative Negative   No results  found.   Please see individual assessment and plan sections.

## 2015-06-07 NOTE — Patient Instructions (Addendum)
Thank you for coming in today. I will call you if labs are abnormal. Follow up with [facility] for pelvic exam if not better. Return as needed

## 2015-06-07 NOTE — Assessment & Plan Note (Signed)
Refill insulin. Check A1c. Follow-up with PCP.

## 2015-06-08 LAB — LIPASE: Lipase: <10 U/L (ref 0–75)

## 2015-06-08 LAB — CBC
HEMATOCRIT: 37.5 % (ref 36.0–46.0)
Hemoglobin: 11.9 g/dL — ABNORMAL LOW (ref 12.0–15.0)
MCH: 28.1 pg (ref 26.0–34.0)
MCHC: 31.7 g/dL (ref 30.0–36.0)
MCV: 88.4 fL (ref 78.0–100.0)
MPV: 11.7 fL (ref 8.6–12.4)
PLATELETS: 266 10*3/uL (ref 150–400)
RBC: 4.24 MIL/uL (ref 3.87–5.11)
RDW: 14.1 % (ref 11.5–15.5)
WBC: 6.3 10*3/uL (ref 4.0–10.5)

## 2015-06-08 LAB — HEMOGLOBIN A1C
Hgb A1c MFr Bld: 8.4 % — ABNORMAL HIGH (ref <5.7)
MEAN PLASMA GLUCOSE: 194 mg/dL — AB (ref <117)

## 2015-06-10 ENCOUNTER — Other Ambulatory Visit: Payer: Self-pay

## 2015-06-10 DIAGNOSIS — F310 Bipolar disorder, current episode hypomanic: Secondary | ICD-10-CM

## 2015-06-10 DIAGNOSIS — F3130 Bipolar disorder, current episode depressed, mild or moderate severity, unspecified: Secondary | ICD-10-CM

## 2015-06-10 MED ORDER — FEXOFENADINE HCL 180 MG PO TABS: 180.0000 mg | ORAL_TABLET | Freq: Every day | ORAL | Status: DC

## 2015-06-10 MED ORDER — FLUOXETINE HCL 20 MG PO CAPS: 60.0000 mg | ORAL_CAPSULE | Freq: Every day | ORAL | Status: DC

## 2015-06-10 MED ORDER — FLUTICASONE PROPIONATE 50 MCG/ACT NA SUSP
1.0000 | Freq: Every day | NASAL | Status: DC
Start: 2015-06-10 — End: 2015-07-19

## 2015-06-10 MED ORDER — BUPROPION HCL ER (XL) 150 MG PO TB24: 150.0000 mg | ORAL_TABLET | Freq: Every day | ORAL | Status: DC

## 2015-07-14 ENCOUNTER — Other Ambulatory Visit: Payer: Self-pay | Admitting: Physician Assistant

## 2015-07-19 ENCOUNTER — Encounter: Payer: Self-pay | Admitting: Physician Assistant

## 2015-07-19 ENCOUNTER — Ambulatory Visit (INDEPENDENT_AMBULATORY_CARE_PROVIDER_SITE_OTHER): Payer: Medicare Other | Admitting: Physician Assistant

## 2015-07-19 VITALS — BP 115/72 | HR 95 | Ht 62.0 in | Wt 145.0 lb

## 2015-07-19 DIAGNOSIS — F3130 Bipolar disorder, current episode depressed, mild or moderate severity, unspecified: Secondary | ICD-10-CM | POA: Diagnosis not present

## 2015-07-19 DIAGNOSIS — E1042 Type 1 diabetes mellitus with diabetic polyneuropathy: Secondary | ICD-10-CM | POA: Diagnosis not present

## 2015-07-19 DIAGNOSIS — Z79899 Other long term (current) drug therapy: Secondary | ICD-10-CM

## 2015-07-19 DIAGNOSIS — Z13220 Encounter for screening for lipoid disorders: Secondary | ICD-10-CM | POA: Diagnosis not present

## 2015-07-19 DIAGNOSIS — G990 Autonomic neuropathy in diseases classified elsewhere: Secondary | ICD-10-CM

## 2015-07-19 DIAGNOSIS — E1043 Type 1 diabetes mellitus with diabetic autonomic (poly)neuropathy: Secondary | ICD-10-CM

## 2015-07-19 MED ORDER — NAPROXEN 500 MG PO TABS: 500.0000 mg | ORAL_TABLET | Freq: Every day | ORAL | Status: DC

## 2015-07-19 MED ORDER — FEXOFENADINE HCL 180 MG PO TABS: 180.0000 mg | ORAL_TABLET | Freq: Every day | ORAL | Status: DC

## 2015-07-19 MED ORDER — FLUTICASONE PROPIONATE 50 MCG/ACT NA SUSP: 1.0000 | Freq: Every day | NASAL | Status: DC

## 2015-07-19 MED ORDER — BUPROPION HCL ER (XL) 150 MG PO TB24: 150.0000 mg | ORAL_TABLET | Freq: Every day | ORAL | Status: DC

## 2015-07-19 MED ORDER — ALPRAZOLAM 0.25 MG PO TABS: 0.2500 mg | ORAL_TABLET | ORAL | Status: DC | PRN

## 2015-07-19 MED ORDER — FLUOXETINE HCL 20 MG PO CAPS: 60.0000 mg | ORAL_CAPSULE | Freq: Every day | ORAL | Status: DC

## 2015-07-21 DIAGNOSIS — Z13220 Encounter for screening for lipoid disorders: Secondary | ICD-10-CM | POA: Diagnosis not present

## 2015-07-21 DIAGNOSIS — Z79899 Other long term (current) drug therapy: Secondary | ICD-10-CM | POA: Diagnosis not present

## 2015-07-22 LAB — COMPLETE METABOLIC PANEL WITH GFR
ALBUMIN: 3.8 g/dL (ref 3.6–5.1)
ALK PHOS: 56 U/L (ref 33–130)
ALT: 16 U/L (ref 6–29)
AST: 18 U/L (ref 10–35)
BUN: 21 mg/dL (ref 7–25)
CALCIUM: 8.8 mg/dL (ref 8.6–10.4)
CHLORIDE: 100 mmol/L (ref 98–110)
CO2: 27 mmol/L (ref 20–31)
CREATININE: 0.7 mg/dL (ref 0.50–1.05)
GFR, Est Non African American: >89 mL/min (ref >=60)
Glucose, Bld: 140 mg/dL — ABNORMAL HIGH (ref 65–99)
Potassium: 4.4 mmol/L (ref 3.5–5.3)
Sodium: 136 mmol/L (ref 135–146)
Total Bilirubin: 0.5 mg/dL (ref 0.2–1.2)
Total Protein: 6.3 g/dL (ref 6.1–8.1)

## 2015-07-22 LAB — LIPID PANEL
CHOLESTEROL: 182 mg/dL (ref 125–200)
HDL: 83 mg/dL (ref >=46)
LDL Cholesterol: 86 mg/dL (ref <130)
TRIGLYCERIDES: 64 mg/dL (ref <150)
Total CHOL/HDL Ratio: 2.2 Ratio (ref <=5.0)
VLDL: 13 mg/dL (ref <30)

## 2015-07-23 MED ORDER — INSULIN NPH (HUMAN) (ISOPHANE) 100 UNIT/ML ~~LOC~~ SUSP: SUBCUTANEOUS | Status: DC

## 2015-07-23 MED ORDER — INSULIN REGULAR HUMAN 100 UNIT/ML IJ SOLN: INTRAMUSCULAR | Status: DC

## 2015-07-25 ENCOUNTER — Encounter: Payer: Self-pay | Admitting: Physician Assistant

## 2015-07-25 MED ORDER — LISINOPRIL 2.5 MG PO TABS: 2.5000 mg | ORAL_TABLET | Freq: Every day | ORAL | Status: DC

## 2015-07-25 NOTE — Progress Notes (Signed)
   Subjective:    Patient ID: Sheryl Matthews, female    DOB: 1966/03/02, 49 y.o.   MRN: [id number]  HPI  Pt presents to the clinic for medication refills.   DM- was not taking insulin regularly at time of a1c. Started back taking and sugars are coming down. Fasting sugars recently have been 120-200. Most of the time sugars have been under 200. She went to endocrinology referral and got lost and did not go back or reschedule appt. She has also readjusted her diet for better glucose control. No hypoglyemic events.   Bipolar- went downstairs and did not like the way she was treated. Needs refills today. Continues to feel up and down with mood. No sucidal or homicidal thoughts.   Review of Systems  All other systems reviewed and are negative.      Objective:   Physical Exam  Constitutional: She is oriented to person, place, and time. She appears well-developed and well-nourished.  HENT:  Head: Normocephalic and atraumatic.  Cardiovascular: Normal rate, regular rhythm and normal heart sounds.   Pulmonary/Chest: Effort normal and breath sounds normal.  Neurological: She is alert and oriented to person, place, and time.  Skin: Skin is dry.  Psychiatric: She has a normal mood and affect. Her behavior is normal.          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 Uncontrolled. Pt had not been taking insulin regularly at time of A!C. Start as previously been taking. If not improving in next 2 months then will make changes. Per pt sugars that has been checking have iimproved. Not on ace will need to start. Will send over lisinopril 2.5mg  daily.    Bipolar disorder with moderate mania- refilled medications today. Pt is not on a mood stablizer. She does not like weight gain.  Needs pysch and did not like downstairs will refer out. Request woman.

## 2015-07-30 ENCOUNTER — Other Ambulatory Visit: Payer: Self-pay | Admitting: Physician Assistant

## 2015-07-30 DIAGNOSIS — Z1211 Encounter for screening for malignant neoplasm of colon: Secondary | ICD-10-CM

## 2015-08-04 DIAGNOSIS — H9392 Unspecified disorder of left ear: Secondary | ICD-10-CM | POA: Diagnosis not present

## 2015-08-10 DIAGNOSIS — R140 Abdominal distension (gaseous): Secondary | ICD-10-CM | POA: Diagnosis not present

## 2015-08-10 DIAGNOSIS — K625 Hemorrhage of anus and rectum: Secondary | ICD-10-CM | POA: Diagnosis not present

## 2015-08-10 DIAGNOSIS — K5900 Constipation, unspecified: Secondary | ICD-10-CM | POA: Diagnosis not present

## 2015-08-10 DIAGNOSIS — K6289 Other specified diseases of anus and rectum: Secondary | ICD-10-CM | POA: Diagnosis not present

## 2015-08-11 DIAGNOSIS — E162 Hypoglycemia, unspecified: Secondary | ICD-10-CM | POA: Diagnosis not present

## 2015-08-11 DIAGNOSIS — R404 Transient alteration of awareness: Secondary | ICD-10-CM | POA: Diagnosis not present

## 2015-08-11 DIAGNOSIS — E161 Other hypoglycemia: Secondary | ICD-10-CM | POA: Diagnosis not present

## 2015-08-11 DIAGNOSIS — F319 Bipolar disorder, unspecified: Secondary | ICD-10-CM | POA: Diagnosis not present

## 2015-08-11 DIAGNOSIS — Z794 Long term (current) use of insulin: Secondary | ICD-10-CM | POA: Diagnosis not present

## 2015-08-11 DIAGNOSIS — Z91040 Latex allergy status: Secondary | ICD-10-CM | POA: Diagnosis not present

## 2015-08-11 DIAGNOSIS — Z91048 Other nonmedicinal substance allergy status: Secondary | ICD-10-CM | POA: Diagnosis not present

## 2015-08-11 DIAGNOSIS — Z79899 Other long term (current) drug therapy: Secondary | ICD-10-CM | POA: Diagnosis not present

## 2015-08-11 DIAGNOSIS — Z7951 Long term (current) use of inhaled steroids: Secondary | ICD-10-CM | POA: Diagnosis not present

## 2015-08-11 DIAGNOSIS — E11649 Type 2 diabetes mellitus with hypoglycemia without coma: Secondary | ICD-10-CM | POA: Diagnosis not present

## 2015-08-13 DIAGNOSIS — K625 Hemorrhage of anus and rectum: Secondary | ICD-10-CM | POA: Diagnosis not present

## 2015-08-13 DIAGNOSIS — K6289 Other specified diseases of anus and rectum: Secondary | ICD-10-CM | POA: Diagnosis not present

## 2015-08-16 ENCOUNTER — Encounter: Payer: Self-pay | Admitting: Physician Assistant

## 2015-08-16 DIAGNOSIS — K648 Other hemorrhoids: Secondary | ICD-10-CM | POA: Insufficient documentation

## 2015-08-23 ENCOUNTER — Other Ambulatory Visit: Payer: Self-pay | Admitting: Physician Assistant

## 2015-08-23 ENCOUNTER — Telehealth: Payer: Self-pay | Admitting: Physician Assistant

## 2015-08-23 DIAGNOSIS — F3112 Bipolar disorder, current episode manic without psychotic features, moderate: Secondary | ICD-10-CM

## 2015-08-23 DIAGNOSIS — E1043 Type 1 diabetes mellitus with diabetic autonomic (poly)neuropathy: Secondary | ICD-10-CM

## 2015-08-23 NOTE — Telephone Encounter (Signed)
Pt called and stated she has decided she would like for you to send a referral to an endocrinologist and she has never heard anything about the referral you were suppose to do for a psychiatrist. She also stated that she needs a refill on her glucagon kit for when her sugar drops. Thanks

## 2015-08-23 NOTE — Telephone Encounter (Signed)
Referral made for both again. Not sure what happened with psych order placed in system. Let us know if not called within a week.

## 2015-08-25 ENCOUNTER — Other Ambulatory Visit: Payer: Self-pay | Admitting: Physician Assistant

## 2015-08-25 ENCOUNTER — Encounter: Payer: Self-pay | Admitting: Physician Assistant

## 2015-08-28 DIAGNOSIS — R031 Nonspecific low blood-pressure reading: Secondary | ICD-10-CM | POA: Diagnosis not present

## 2015-09-15 ENCOUNTER — Other Ambulatory Visit: Payer: Self-pay | Admitting: *Deleted

## 2015-09-15 ENCOUNTER — Encounter: Payer: Self-pay | Admitting: Endocrinology

## 2015-09-15 ENCOUNTER — Ambulatory Visit (INDEPENDENT_AMBULATORY_CARE_PROVIDER_SITE_OTHER): Payer: Medicare Other | Admitting: Endocrinology

## 2015-09-15 VITALS — BP 110/64 | HR 88 | Temp 98.2°F | Resp 14 | Ht 63.0 in | Wt 143.4 lb

## 2015-09-15 DIAGNOSIS — R1010 Upper abdominal pain, unspecified: Secondary | ICD-10-CM

## 2015-09-15 DIAGNOSIS — E1065 Type 1 diabetes mellitus with hyperglycemia: Secondary | ICD-10-CM

## 2015-09-15 MED ORDER — INSULIN PEN NEEDLE 32G X 4 MM MISC: Status: DC

## 2015-09-15 MED ORDER — INSULIN GLARGINE 300 UNIT/ML ~~LOC~~ SOPN: 40.0000 [IU] | PEN_INJECTOR | Freq: Every day | SUBCUTANEOUS | Status: DC

## 2015-09-15 MED ORDER — INSULIN GLARGINE 100 UNIT/ML SOLOSTAR PEN: 20.0000 [IU] | PEN_INJECTOR | Freq: Two times a day (BID) | SUBCUTANEOUS | Status: DC

## 2015-09-15 MED ORDER — METOCLOPRAMIDE HCL 5 MG PO TABS: 5.0000 mg | ORAL_TABLET | Freq: Three times a day (TID) | ORAL | Status: DC

## 2015-09-15 MED ORDER — GLUCOSE BLOOD VI STRP: ORAL_STRIP | Status: DC

## 2015-09-15 MED ORDER — INSULIN ASPART 100 UNIT/ML FLEXPEN: PEN_INJECTOR | SUBCUTANEOUS | Status: DC

## 2015-09-15 NOTE — Progress Notes (Signed)
Patient ID: Sheryl Matthews, female   DOB: October 27, 1966, 49 y.o.   MRN: [id number]          Reason for Appointment : Consultation for Type 1 Diabetes  History of Present Illness          Diagnosis: Type 1 diabetes mellitus, date of diagnosis:  at age 10    Previous history:   She has been on insulin since the time of diagnosis. Also appears to be on the same program of NPH and Regular Insulin since diagnosis was made  INSULIN regimen is described as: 10R 32N about 30 min ac breakfast and 18 N, 4 regular at suppertime  Recent history:  She has usually been in the care of her primary care physicians and not endocrinologists At one point she was advised to try alternative regimens of insulin but she could not follow the instructions and continued on her NPH and regular regimen She has never used an insulin pen before She recently has established with a new primary care physician locally and is now being referred for poor control.      Glucose monitoring:  is being done  2-3 times a day         Glucometer:  Freestyle .      Blood Glucose readings from meter download:  Mean values apply above for all meters except median for One Touch  PRE-MEAL Fasting Lunch Dinner Bedtime Overall  Glucose range: 36-501  62, 101   34-195  78-501  34-501   Mean/median: 240    190   Glucose patterns and problems identified:      she has mostly significantly higher fasting blood sugars, frequently over 200  Blood sugars are normal to slightly low around lunchtime  Blood sugars are generally low-normal around suppertime    Most of her blood sugars are relatively high after evening meal but variable  She is taking her evening NPH at suppertime instead of bedtime for convenience of mixing her dose with Regular Insulin.  Recently because of her having frequent nocturnal hypoglycemia she is usually not taking her insulin at suppertime  She does not taken insulin to cover lunch although sugars in the  afternoons are relatively low  She is able to eat only small portions at meals and intake is variable      Hypoglycemia:  occurs at various times including overnight and frequently before supper Factors causing hyperglycemia: Reduced food intake, taking NPH at suppertime Symptoms of hypoglycemia: Weakness, dizziness, difficulty thinking, feels sweaty and shaky only if blood sugar in the 30s.  May recognize symptoms low 60 at times but inconsistently.  Has had seizures during the night from hypoglycemia Treatment of hypoglycemia: juice, peanut butter, Glucagon     Diabetes labs:  Lab Results  Component Value Date   HGBA1C 8.4* 06/07/2015   Lab Results  Component Value Date   LDLCALC 86 07/21/2015   CREATININE 0.70 07/21/2015      Medication List       This list is accurate as of: 09/15/15 11:59 PM.  Always use your most recent med list.               ALPRAZolam 0.25 MG tablet  Commonly known as:  XANAX  Take 1 tablet (0.25 mg total) by mouth as needed for anxiety.     buPROPion 150 MG 24 hr tablet  Commonly known as:  WELLBUTRIN XL  Take 1 tablet (150 mg total) by mouth daily.  fexofenadine 180 MG tablet  Commonly known as:  ALLEGRA  Take 1 tablet (180 mg total) by mouth daily.     FLUoxetine 20 MG capsule  Commonly known as:  PROZAC  Take 3 capsules (60 mg total) by mouth daily.     fluticasone 50 MCG/ACT nasal spray  Commonly known as:  FLONASE  Place 1 spray into both nostrils daily.     gabapentin 300 MG capsule  Commonly known as:  NEURONTIN  TAKE 1 CAPSULE DAILY.     glucose blood test strip  Commonly known as:  FREESTYLE LITE  Use as instructed to check blood sugar 6 times per day     HAIR VITAMINS PO  Take by mouth daily.     insulin aspart 100 UNIT/ML FlexPen  Commonly known as:  NOVOLOG FLEXPEN  Inject 4-6 units three times a day with meals     Insulin Glargine 100 UNIT/ML Solostar Pen  Commonly known as:  LANTUS SOLOSTAR  Inject 20  Units into the skin 2 (two) times daily.     insulin NPH Human 100 UNIT/ML injection  Commonly known as:  HUMULIN N,NOVOLIN N  Inject 32 units every morning and 18 units every evening     Insulin Pen Needle 32G X 4 MM Misc  Commonly known as:  BD PEN NEEDLE NANO U/F  Use 4 per day to inject insulin     insulin regular 100 units/mL injection  Commonly known as:  NOVOLIN R,HUMULIN R  Inject 10 units every morning and 4 units every evening     lisinopril 2.5 MG tablet  Commonly known as:  ZESTRIL  Take 1 tablet (2.5 mg total) by mouth daily.     metoCLOPramide 5 MG tablet  Commonly known as:  REGLAN  Take 1 tablet (5 mg total) by mouth 3 (three) times daily before meals.     naproxen 500 MG tablet  Commonly known as:  NAPROSYN  Take 1 tablet (500 mg total) by mouth daily.     NEXIUM 40 MG capsule  Generic drug:  esomeprazole  TAKE 1 CAPSULE DAILY AT 12 NOON     PROBIOTIC DAILY PO  Take by mouth.     VANICREAM EX  Apply topically as needed.        Allergies:  Allergies  Allergen Reactions  . Latex Rash  . Tape Rash    Past Medical History  Diagnosis Date  . Diabetes mellitus without complication ([place])     No past surgical history on file.  Family History  Problem Relation Age of Onset  . Depression Sister   . Alcohol abuse Brother   . Diabetes Brother     Social History:  reports that she has never smoked. She has never used smokeless tobacco. She reports that she does not drink alcohol or use illicit drugs.    Review of Systems   Review of Systems  HENT:       Running nose from allergies, treated with Flonase  Gastrointestinal: Positive for abdominal pain.       She has had mid to upper abdominal pain for about a year which is frequently when she tries to eat  Psychiatric/Behavioral: Positive for depression.        Lipids: Not on any statins  Lab Results  Component Value Date   CHOL 182 07/21/2015   HDL 83 07/21/2015   LDLCALC 86 07/21/2015     TRIG 64 07/21/2015   CHOLHDL 2.2 07/21/2015  Headaches: None             Skin: No rash or infections     Thyroid:  She does tend to feel tired, has not had thyroid evaluation     The blood pressure has been      No swelling of feet.     No shortness of breath on exertion.     Bowel habits:  Normal.   She does have fairly consistent symptoms of nausea for several months.  She does not have any vomiting but has nausea most of the time.  She does get hungry but she is not able to eat much because of abdominal discomfort and nausea.  Sometimes may not eat anything and prefers to eat only bland food with starch       No frequency of urination or nocturia       No joint  pains.has muscle pains and reportedly has fibromyalgia         She has a history of Numbness 1 and, tingling in her feet for about a year.  No significant pain   LABS:  No visits with results within 1 Week(s) from this visit. Latest known visit with results is:  Office Visit on 07/19/2015  Component Date Value Ref Range Status  . Sodium 07/21/2015 136  135 - 146 mmol/L Final  . Potassium 07/21/2015 4.4  3.5 - 5.3 mmol/L Final  . Chloride 07/21/2015 100  98 - 110 mmol/L Final  . CO2 07/21/2015 27  20 - 31 mmol/L Final  . Glucose, Bld 07/21/2015 140* 65 - 99 mg/dL Final  . BUN 07/21/2015 21  7 - 25 mg/dL Final  . Creat 07/21/2015 0.70  0.50 - 1.05 mg/dL Final  . Total Bilirubin 07/21/2015 0.5  0.2 - 1.2 mg/dL Final  . Alkaline Phosphatase 07/21/2015 56  33 - 130 U/L Final  . AST 07/21/2015 18  10 - 35 U/L Final  . ALT 07/21/2015 16  6 - 29 U/L Final  . Total Protein 07/21/2015 6.3  6.1 - 8.1 g/dL Final  . Albumin 07/21/2015 3.8  3.6 - 5.1 g/dL Final  . Calcium 07/21/2015 8.8  8.6 - 10.4 mg/dL Final  . GFR, Est African American 07/21/2015 >89  >=60 mL/min Final  . GFR, Est Non African American 07/21/2015 >89  >=60 mL/min Final   Comment:   The estimated GFR is a calculation valid for adults (>=7  years old) that uses the CKD-EPI algorithm to adjust for age and sex. It is   not to be used for children, pregnant women, hospitalized patients,    patients on dialysis, or with rapidly changing kidney function. According to the NKDEP, eGFR >89 is normal, 60-89 shows mild impairment, 30-59 shows moderate impairment, 15-29 shows severe impairment and <15 is ESRD.     Marland Kitchen Cholesterol 07/21/2015 182  125 - 200 mg/dL Final  . Triglycerides 07/21/2015 64  <150 mg/dL Final  . HDL 07/21/2015 83  >=46 mg/dL Final  . Total CHOL/HDL Ratio 07/21/2015 2.2  <=5.0 Ratio Final  . VLDL 07/21/2015 13  <30 mg/dL Final  . LDL Cholesterol 07/21/2015 86  <130 mg/dL Final   Comment:   Total Cholesterol/HDL Ratio:CHD Risk                        Coronary Heart Disease Risk Table  Men       Women          1/2 Average Risk              3.4        3.3              Average Risk              5.0        4.4           2X Average Risk              9.6        7.1           3X Average Risk             23.4       11.0 Use the calculated Patient Ratio above and the CHD Risk table  to determine the patient's CHD Risk.   Footnotes:  (1) ** Please note change in unit of measure and reference range(s). **       Physical Examination:  BP 110/64 mmHg  Pulse 88  Temp(Src) 98.2 F (36.8 C)  Resp 14  Ht $R'5\' 3"'cx$  (1.6 m)  Wt 143 lb 6.4 oz (65.046 kg)  BMI 25.41 kg/m2  SpO2 95%  GENERAL:  averagely built and nourished.  Pleasant and cooperative HEENT:         Eye exam shows normal external appearance. Fundus exam shows no retinopathy. Oral exam shows normal mucosa .  NECK:         General:  Neck exam shows no lymphadenopathy.  Carotids are normal to palpation and no bruit heard.  Thyroid is not enlarged and no nodules felt.   LUNGS:         Chest is symmetrical. Lungs are clear to auscultation.Marland Kitchen   HEART:         Heart sounds:  S1 and S2 are normal. No murmurs or clicks heard.,  no S3 or S4.   ABDOMEN:  no distention present. Liver and spleen are not palpable. No other mass or tenderness present.  EXTREMITIES:     There is no edema. No skin lesions present.Marland Kitchen  NEUROLOGICAL:        Vibration sense is mildly reduced in toes. Ankle jerks are absent bilaterally.  biceps reflexes are decreased         Diabetic foot exam:  see separate section, normal monofilament sensation  MUSCULOSKELETAL:       There is no enlargement or deformity of the joints.  SKIN:       No rash, lesions or abnormal pigmentation       ASSESSMENT:  Diabetes type 1, long-standing and poorly controlled She is on a regimen of NPH and regular insulin twice a day with syringe and vial, taking at breakfast and supper Because of her difficulties with frequent and severe hypoglycemia she is mostly taking her insulin doses in the morning only This is causing marked increase in fasting blood sugars usually She has poor understanding of diabetes management and little knowledge about meal planning or carbohydrate counting Has probably been given a trial of basal bolus regimen previously but was not able to follow it because of lack of understanding   Problems identified:   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 including overnight  History of severe hypoglycemia including seizures  Inappropriate regimen of NPH at breakfast and supper for type I diabetic patient causing periodic  hypoglycemia overnight and in adequate control of fasting blood sugars  Patient not taking NPH frequently in the evening  Relatively low blood sugars in the afternoons and occasionally overnight along with markedly increased blood sugars fasting  Complications: Peripheral neuropathy, possible gastroparesis, reportedly background retinopathy, unknown microalbumin status  History of bipolar disorder  ABDOMINAL pain/nausea not clear if this is related to gastroparesis or other underlying abdominal pathology.   Needs evaluation  PLAN:   Stop taking NPH and Regular Insulin and start Balis basal bolus insulin regimen. She will be instructed on how to use an insulin pen She will start taking basal insulin with Toujeo If she has difficulty obtaining Toujeo 100 insurance will need to take Lantus twice a day We can start her with 40 units total basal insulin Given her flowsheet to adjust her basal insulin every 3 days by 2 units to get blood sugars in the 100-150 range If blood sugars are lower in the mornings will reduce her insulin by 4 units For now we will empirically start her on Novolog insulin to cover her meals with 4-6 units based on her meal size.  She can take her Novolog after eating based on her actual food intake Consultation with dietitian and nurse educator  Given her information on continuous glucose monitoring with the decks, and she will try to get this approved through her insurance. Discussed that if she is interested in insulin pump this should be deferred until she has better knowledge about diabetes management including carbohydrate counting Discussed treatment of hypoglycemia Her husband can continue to give glucagon for severe hypoglycemia  Abdominal pain/nausea: This may be partly related to gastroparesis although no classical symptoms are present.  She will be empirically started on Reglan 5 mg before meals and bedtime until she is able to have her pain evaluated by gastroenterologist    Patient Instructions  New insulin regimen:  TOUJEO insulin: This insulin provides blood sugar control for up to 24 hours.   Start with 40 units at SUPPERTIME daily and increase by 2 units every 3 days until the waking up sugars are under 150.  Then continue the same dose. If blood sugar is under 90 for 2 days in a row, reduce the dose by 4 units.  Note that this insulin does not control the rise of blood sugar with meals    NOVOLOG insulin: We can have you start taking this right after  each meal including lunch based on how much carbohydrate you have eaten and total meal size Take 4 units for small meals and up to 6 units for larger meals May skip the dose if not eating any carbohydrate at all The doses be adjusted based on how high the sugars are going about 2 hours after eating.  Try to keep the readings after meals between 140 and 180       Counseling time on subjects discussed above is over 50% of today's 60 minute visit    Mercedes Valeriano 09/16/2015, 9:03 AM   Note: This note was prepared with Estate agent. Any transcriptional errors that result from this process are unintentional.

## 2015-09-15 NOTE — Progress Notes (Deleted)
Patient ID: Sheryl Matthews, female   DOB: [birth year], 49 y.o.   MRN: [id number]           Reason for Appointment: Consultation for Type 2 Diabetes  Referring physician:  History of Present Illness:          Date of diagnosis of type 2 diabetes mellitus:        Background history:   Recent history:   INSULIN regimen is:       Current blood sugar patterns and problems identified:      Non-insulin hypoglycemic drugs the patient is taking are:      Side effects from medications have been:  Compliance with the medical regimen:  Hypoglycemia:    Glucose monitoring:  done  times a day         Glucometer: One Touch.      Blood Glucose readings by time of day and averages from meter download:  PREMEAL Breakfast Lunch Dinner Bedtime  Overall   Glucose range:       Median:        POST-MEAL PC Breakfast PC Lunch PC Dinner  Glucose range:     Median:      Self-care: The diet that the patient has been following is: tries to limit .     Meal times: Breakfast: Lunch: Dinner:   Typical meal intake: Breakfast is               Dietician visit, most recent:               Exercise:    Weight history: Wt Readings from Last 3 Encounters:  09/15/15 143 lb 6.4 oz (65.046 kg)  07/19/15 145 lb (65.772 kg)  06/07/15 144 lb (65.318 kg)    Glycemic control:   Lab Results  Component Value Date   HGBA1C 8.4* 06/07/2015   Lab Results  Component Value Date   LDLCALC 86 07/21/2015   CREATININE 0.70 07/21/2015         Medication List       This list is accurate as of: 09/15/15  2:24 PM.  Always use your most recent med list.               ALPRAZolam 0.25 MG tablet  Commonly known as:  XANAX  Take 1 tablet (0.25 mg total) by mouth as needed for anxiety.     buPROPion 150 MG 24 hr tablet  Commonly known as:  WELLBUTRIN XL  Take 1 tablet (150 mg total) by mouth daily.     fexofenadine 180 MG tablet  Commonly known as:  ALLEGRA  Take 1 tablet (180 mg total) by mouth  daily.     FLUoxetine 20 MG capsule  Commonly known as:  PROZAC  Take 3 capsules (60 mg total) by mouth daily.     fluticasone 50 MCG/ACT nasal spray  Commonly known as:  FLONASE  Place 1 spray into both nostrils daily.     gabapentin 300 MG capsule  Commonly known as:  NEURONTIN  TAKE 1 CAPSULE DAILY.     glucose blood test strip  Commonly known as:  FREESTYLE LITE  Use as instructed     HAIR VITAMINS PO  Take by mouth daily.     insulin NPH Human 100 UNIT/ML injection  Commonly known as:  HUMULIN N,NOVOLIN N  Inject 32 units every morning and 18 units every evening     insulin regular 100 units/mL injection  Commonly known as:  NOVOLIN  R,HUMULIN R  Inject 10 units every morning and 4 units every evening     lisinopril 2.5 MG tablet  Commonly known as:  ZESTRIL  Take 1 tablet (2.5 mg total) by mouth daily.     naproxen 500 MG tablet  Commonly known as:  NAPROSYN  Take 1 tablet (500 mg total) by mouth daily.     NEXIUM 40 MG capsule  Generic drug:  esomeprazole  TAKE 1 CAPSULE DAILY AT 12 NOON     PROBIOTIC DAILY PO  Take by mouth.     VANICREAM EX  Apply topically as needed.        Allergies:  Allergies  Allergen Reactions  . Latex Rash  . Tape Rash    Past Medical History  Diagnosis Date  . Diabetes mellitus without complication ([facility])     No past surgical history on file.  Family History  Problem Relation Age of Onset  . Depression Sister   . Alcohol abuse Brother   . Diabetes Brother     Social History:  reports that she has never smoked. She has never used smokeless tobacco. She reports that she does not drink alcohol or use illicit drugs.    Review of Systems    Lipid history:     Lab Results  Component Value Date   CHOL 182 07/21/2015   HDL 83 07/21/2015   LDLCALC 86 07/21/2015   TRIG 64 07/21/2015   CHOLHDL 2.2 07/21/2015           Constitutional: no recent weight gain/loss, no complaints of unusual fatigue   Eyes: no  history of blurred vision.  Most recent eye exam was   ENT: no nasal congestion, difficulty swallowing, no hoarseness   Cardiovascular: no chest pain or tightness on exertion.  No leg swelling.  Hypertension:  Respiratory: no cough/shortness of breath  Gastrointestinal: no constipation, diarrhea, nausea or abdominal pain  Musculoskeletal: no muscle/joint aches   Urological:   No frequency of urination or  nocturia  Skin: no rash or infections  Neurological: no headaches.  Has no numbness, burning, pains or tingling in feet    Psychiatric: no symptoms of depression  Endocrine: No unusual fatigue, cold intolerance or history of thyroid disease   LABS:  No visits with results within 1 Week(s) from this visit. Latest known visit with results is:  Office Visit on 07/19/2015  Component Date Value Ref Range Status  . Sodium 07/21/2015 136  135 - 146 mmol/L Final  . Potassium 07/21/2015 4.4  3.5 - 5.3 mmol/L Final  . Chloride 07/21/2015 100  98 - 110 mmol/L Final  . CO2 07/21/2015 27  20 - 31 mmol/L Final  . Glucose, Bld 07/21/2015 140* 65 - 99 mg/dL Final  . BUN 46/52/0761 21  7 - 25 mg/dL Final  . Creat 91/55/0271 0.70  0.50 - 1.05 mg/dL Final  . Total Bilirubin 07/21/2015 0.5  0.2 - 1.2 mg/dL Final  . Alkaline Phosphatase 07/21/2015 56  33 - 130 U/L Final  . AST 07/21/2015 18  10 - 35 U/L Final  . ALT 07/21/2015 16  6 - 29 U/L Final  . Total Protein 07/21/2015 6.3  6.1 - 8.1 g/dL Final  . Albumin 42/32/0094 3.8  3.6 - 5.1 g/dL Final  . Calcium 17/91/9957 8.8  8.6 - 10.4 mg/dL Final  . GFR, Est African American 07/21/2015 >89  >=60 mL/min Final  . GFR, Est Non African American 07/21/2015 >89  >=60 mL/min Final  Comment:   The estimated GFR is a calculation valid for adults (>=92 years old) that uses the CKD-EPI algorithm to adjust for age and sex. It is   not to be used for children, pregnant women, hospitalized patients,    patients on dialysis, or with rapidly  changing kidney function. According to the NKDEP, eGFR >89 is normal, 60-89 shows mild impairment, 30-59 shows moderate impairment, 15-29 shows severe impairment and <15 is ESRD.     Marland Kitchen Cholesterol 07/21/2015 182  125 - 200 mg/dL Final  . Triglycerides 07/21/2015 64  <150 mg/dL Final  . HDL 07/21/2015 83  >=46 mg/dL Final  . Total CHOL/HDL Ratio 07/21/2015 2.2  <=5.0 Ratio Final  . VLDL 07/21/2015 13  <30 mg/dL Final  . LDL Cholesterol 07/21/2015 86  <130 mg/dL Final   Comment:   Total Cholesterol/HDL Ratio:CHD Risk                        Coronary Heart Disease Risk Table                                        Men       Women          1/2 Average Risk              3.4        3.3              Average Risk              5.0        4.4           2X Average Risk              9.6        7.1           3X Average Risk             23.4       11.0 Use the calculated Patient Ratio above and the CHD Risk table  to determine the patient's CHD Risk.   Footnotes:  (1) ** Please note change in unit of measure and reference range(s). **       Physical Examination:  BP 110/64 mmHg  Pulse 88  Temp(Src) 98.2 F (36.8 C)  Resp 14  Ht $R'5\' 3"'hq$  (1.6 m)  Wt 143 lb 6.4 oz (65.046 kg)  BMI 25.41 kg/m2  SpO2 95%  GENERAL:         Patient has generalized obesity.   HEENT:         Eye exam shows normal external appearance. Fundus exam shows no retinopathy. Oral exam shows normal mucosa .  NECK:   There is no lymphadenopathy Thyroid is not enlarged and no nodules felt.  Carotids are normal to palpation and no bruit heard LUNGS:         Chest is symmetrical. Lungs are clear to auscultation.Marland Kitchen   HEART:         Heart sounds:  S1 and S2 are normal. No murmur or click heard., no S3 or S4.   ABDOMEN:   There is no distention present. Liver and spleen are not palpable. No other mass or tenderness present.   NEUROLOGICAL:   Vibration sense is  reduced in distal first toes. Ankle jerks are absent bilaterally.  MUSCULOSKELETAL:  There is no swelling or deformity of the peripheral joints. Spine is normal to inspection.   EXTREMITIES:     There is no edema. No skin lesions present.Marland Kitchen SKIN:       No rash or lesions of concern.        ASSESSMENT:  Diabetes type 2, uncontrolled     Complications:  PLAN:      There are no Patient Instructions on file for this visit.  Jayleena Stille 09/15/2015, 2:24 PM   Note: This office note was prepared with Dragon voice recognition system technology. Any transcriptional errors that result from this process are unintentional.

## 2015-09-15 NOTE — Patient Instructions (Signed)
New insulin regimen:  TOUJEO insulin: This insulin provides blood sugar control for up to 24 hours.   Start with 40 units at SUPPERTIME daily and increase by 2 units every 3 days until the waking up sugars are under 150.  Then continue the same dose. If blood sugar is under 90 for 2 days in a row, reduce the dose by 4 units.  Note that this insulin does not control the rise of blood sugar with meals    NOVOLOG insulin: We can have you start taking this right after each meal including lunch based on how much carbohydrate you have eaten and total meal size Take 4 units for small meals and up to 6 units for larger meals May skip the dose if not eating any carbohydrate at all The doses be adjusted based on how high the sugars are going about 2 hours after eating.  Try to keep the readings after meals between 140 and 180

## 2015-09-16 MED ORDER — GLUCAGON (RDNA) 1 MG IJ KIT: PACK | INTRAMUSCULAR | Status: DC

## 2015-09-17 ENCOUNTER — Telehealth: Payer: Self-pay | Admitting: Endocrinology

## 2015-09-17 ENCOUNTER — Telehealth: Payer: Self-pay | Admitting: *Deleted

## 2015-09-17 ENCOUNTER — Other Ambulatory Visit: Payer: Self-pay | Admitting: *Deleted

## 2015-09-17 MED ORDER — INSULIN ASPART 100 UNIT/ML FLEXPEN: PEN_INJECTOR | SUBCUTANEOUS | Status: DC

## 2015-09-17 MED ORDER — GLUCAGON (RDNA) 1 MG IJ KIT: PACK | INTRAMUSCULAR | Status: DC

## 2015-09-17 MED ORDER — INSULIN GLARGINE 300 UNIT/ML ~~LOC~~ SOPN: 40.0000 [IU] | PEN_INJECTOR | Freq: Every day | SUBCUTANEOUS | Status: DC

## 2015-09-17 MED ORDER — GLUCOSE BLOOD VI STRP: ORAL_STRIP | Status: DC

## 2015-09-17 NOTE — Telephone Encounter (Signed)
Pt left vm stating that she never heard anything from the psych referral.  I had Dr. Madilyn Fireman look and it was placed, it was just sent downstairs and pt did not want to go back there.  She will need a new referral placed as "external" and printed.

## 2015-09-17 NOTE — Telephone Encounter (Signed)
Please call pt back about the new insulin she wants to find out if we can send it to [facility]

## 2015-09-17 NOTE — Telephone Encounter (Signed)
I spoke with Mrs. Ruppel, her rx's have been sent [facility]

## 2015-09-20 ENCOUNTER — Other Ambulatory Visit: Payer: Self-pay | Admitting: Physician Assistant

## 2015-09-20 DIAGNOSIS — F3112 Bipolar disorder, current episode manic without psychotic features, moderate: Secondary | ICD-10-CM

## 2015-09-20 NOTE — Telephone Encounter (Signed)
Done

## 2015-09-21 ENCOUNTER — Other Ambulatory Visit: Payer: Self-pay | Admitting: *Deleted

## 2015-09-22 ENCOUNTER — Telehealth: Payer: Self-pay | Admitting: Endocrinology

## 2015-09-22 NOTE — Telephone Encounter (Signed)
Not at all.  °

## 2015-09-22 NOTE — Telephone Encounter (Signed)
Patient said she is not taking the reglan, she wants to see a gastroenterologist first. She said she read the side effects on the insulin and one said it could make you drowsy? Please advise

## 2015-09-22 NOTE — Telephone Encounter (Signed)
Patient called stating that her medication is making her very sleepy   Rx: Novolog and Lantus   Please advise   Thank you

## 2015-09-22 NOTE — Telephone Encounter (Signed)
Please see below.

## 2015-09-22 NOTE — Telephone Encounter (Signed)
She is probably getting sleepiness from metoclopramide.  If she is having the side effects from 5 mg dose may take it only at bedtime.  She needs to discuss her nausea and vomiting with gastroenterologist

## 2015-09-23 NOTE — Telephone Encounter (Signed)
Please call pt back when you can.

## 2015-09-23 NOTE — Telephone Encounter (Signed)
Patients sister called, she said due to the side effects of the new insulin, her sister has not taken any today.  She wanted to know if she could go back to her old insulin because she was afraid her sister was going to have a seizure. Patient then got on the phone and said she was not going to take the new insulin and wanted the dosage for her NPH and R.  Gave information to patient. Please advise

## 2015-09-23 NOTE — Telephone Encounter (Signed)
Patient was informed.  She already had an appointment scheduled with you on the 2nd of November.

## 2015-09-23 NOTE — Telephone Encounter (Signed)
We need to discuss this with the patient in the office, she can schedule a follow-up as soon as possible.  There are no side effects of the new insulin as long  the sugars are not getting low Will not treat patient with NPH and regular insulin

## 2015-09-28 ENCOUNTER — Other Ambulatory Visit: Payer: Self-pay | Admitting: Physician Assistant

## 2015-09-29 ENCOUNTER — Ambulatory Visit: Payer: Self-pay | Admitting: Endocrinology

## 2015-09-29 NOTE — Telephone Encounter (Signed)
Patient Name: Sheryl Matthews Gender: Female DOB: 04/14/1966 Age: 49 Y 10 M 2 D Return Phone Number: [phone] (Primary) Address: City/State/Zip: [place] Client [facility] Night - Client Client Site [facility] Contact Type Call Caller Name same [place] Phone Number same Relationship To Patient Self Is this call to report lab results? No Call Type General Information Initial Comment caller states she needs to cancel her appt for 8:45am tomorrow General Information Type Message Only

## 2015-10-08 ENCOUNTER — Ambulatory Visit: Payer: Self-pay | Admitting: Dietician

## 2015-10-10 ENCOUNTER — Other Ambulatory Visit: Payer: Self-pay | Admitting: Physician Assistant

## 2015-10-25 ENCOUNTER — Telehealth: Payer: Self-pay | Admitting: Nutrition

## 2015-10-25 ENCOUNTER — Other Ambulatory Visit: Payer: Self-pay | Admitting: *Deleted

## 2015-10-25 MED ORDER — INSULIN NPH (HUMAN) (ISOPHANE) 100 UNIT/ML ~~LOC~~ SUSP: SUBCUTANEOUS | Status: DC

## 2015-10-25 NOTE — Telephone Encounter (Signed)
Per Rhonda's request, I called this patient and she said she had lot of questions about her diet and insulin.  She said she has an appt. With me on the 13th of Dec.   I offered to see her tomorrow or next week, but she refused, saying she already has too many appointments this week and next week.

## 2015-10-28 DIAGNOSIS — J309 Allergic rhinitis, unspecified: Secondary | ICD-10-CM | POA: Diagnosis not present

## 2015-10-28 DIAGNOSIS — H9392 Unspecified disorder of left ear: Secondary | ICD-10-CM | POA: Diagnosis not present

## 2015-10-28 DIAGNOSIS — R1310 Dysphagia, unspecified: Secondary | ICD-10-CM | POA: Diagnosis not present

## 2015-10-28 DIAGNOSIS — J387 Other diseases of larynx: Secondary | ICD-10-CM | POA: Diagnosis not present

## 2015-10-29 ENCOUNTER — Ambulatory Visit: Payer: Self-pay | Admitting: Dietician

## 2015-11-02 ENCOUNTER — Ambulatory Visit (HOSPITAL_COMMUNITY): Payer: Medicare Other | Admitting: Psychiatry

## 2015-11-05 ENCOUNTER — Other Ambulatory Visit: Payer: Self-pay | Admitting: Physician Assistant

## 2015-11-09 ENCOUNTER — Ambulatory Visit: Payer: Self-pay | Admitting: Nutrition

## 2015-11-09 ENCOUNTER — Ambulatory Visit (INDEPENDENT_AMBULATORY_CARE_PROVIDER_SITE_OTHER): Payer: TRICARE For Life (TFL) | Admitting: Psychiatry

## 2015-11-09 ENCOUNTER — Encounter (HOSPITAL_COMMUNITY): Payer: Self-pay | Admitting: Psychiatry

## 2015-11-09 VITALS — BP 124/70 | HR 81 | Ht 63.0 in | Wt 145.0 lb

## 2015-11-09 DIAGNOSIS — F411 Generalized anxiety disorder: Secondary | ICD-10-CM | POA: Diagnosis not present

## 2015-11-09 DIAGNOSIS — G4700 Insomnia, unspecified: Secondary | ICD-10-CM | POA: Diagnosis not present

## 2015-11-09 DIAGNOSIS — F3130 Bipolar disorder, current episode depressed, mild or moderate severity, unspecified: Secondary | ICD-10-CM | POA: Diagnosis not present

## 2015-11-09 MED ORDER — FLUOXETINE HCL 20 MG PO CAPS: 60.0000 mg | ORAL_CAPSULE | Freq: Every day | ORAL | Status: DC

## 2015-11-09 MED ORDER — BUPROPION HCL ER (XL) 150 MG PO TB24: 150.0000 mg | ORAL_TABLET | Freq: Every day | ORAL | Status: DC

## 2015-11-09 MED ORDER — TOPIRAMATE 25 MG PO CPSP: 50.0000 mg | ORAL_CAPSULE | Freq: Two times a day (BID) | ORAL | Status: DC

## 2015-11-09 NOTE — Progress Notes (Signed)
Patient ID: Sheryl Matthews, female   DOB: 1966-05-02, 49 y.o.   MRN: [id number]  [place] Progress Note  Sheryl Matthews [id number] 49 y.o.  11/09/2015 2:17 PM  Chief Complaint:  Bipolar disorder MRE manic History of Present Illness:  Patient has been diagnosed with bipolar for many years. She has been sensitive to sulfa medications currently she is on Wellbutrin Prozac. Bipolar; still feels down. Sometimes she feels high. He explained that she is on a most advisors she is taking gabapentin but has stopped taking Lamictal because of some side effect concerned that she does not remember what She does endorse racing thoughts decreased need for sleep and excessive energy at times. In the past she has had manic episodes when she would do in discrete sex and sees excessive energy and decreased need for sleep in the beginning is difficult for her husband to understand what she is going through She also endorsed excessive worries, unreasonable at times. She also endorses difficulty sleeping and also fibromyalgia for which she is taking Aggravating factors; stress and poor sleep Modifying factors; supportive husband and going to gym Severity of depression; 4 out of 10. 10 being no depression Context; stress poor sleep Duration; more than 10 years No history of psychotic symptoms currently. Denies drug use or ABUSE of alcohol as of now   Suicidal Ideation: No Plan Formed: No Patient has means to carry out plan: No  Homicidal Ideation: No Plan Formed: No Patient has means to carry out plan: No  Neurologic: Headache: No Seizure: No Paresthesias: No  Past Medical Family, Social History: Husband is finishing his deployment and will be retirement next week. She is glad that he is home, but also, a little anxious about it as well.  Outpatient Encounter Prescriptions as of 11/09/2015  Medication Sig  . ALPRAZolam (XANAX) 0.25 MG tablet Take 1 tablet (0.25 mg total) by mouth  as needed for anxiety.  Marland Kitchen buPROPion (WELLBUTRIN XL) 150 MG 24 hr tablet Take 1 tablet (150 mg total) by mouth daily.  . Emollient (VANICREAM EX) Apply topically as needed.  . fexofenadine (ALLEGRA) 180 MG tablet Take 1 tablet (180 mg total) by mouth daily.  Marland Kitchen FLUoxetine (PROZAC) 20 MG capsule Take 3 capsules (60 mg total) by mouth daily.  . fluticasone (FLONASE) 50 MCG/ACT nasal spray Place 1 spray into both nostrils daily.  Marland Kitchen gabapentin (NEURONTIN) 300 MG capsule TAKE 1 CAPSULE DAILY.  Marland Kitchen glucagon 1 MG injection Inject 1 mg intramuscularly for severe hypoglycemia  . glucose blood (FREESTYLE LITE) test strip Use as instructed to check blood sugar 6 times per day  . insulin aspart (NOVOLOG FLEXPEN) 100 UNIT/ML FlexPen Inject 4-6 units three times a day with meals  . Insulin Glargine (LANTUS SOLOSTAR) 100 UNIT/ML Solostar Pen Inject 20 Units into the skin 2 (two) times daily.  . Insulin Glargine (TOUJEO SOLOSTAR) 300 UNIT/ML SOPN Inject 40 Units into the skin daily.  . insulin NPH Human (HUMULIN N,NOVOLIN N) 100 UNIT/ML injection Inject 32 units every morning and 18 units every evening  . Insulin Pen Needle (BD PEN NEEDLE NANO U/F) 32G X 4 MM MISC Use 4 per day to inject insulin  . insulin regular (NOVOLIN R,HUMULIN R) 100 units/mL injection Inject 10 units every morning and 4 units every evening  . metoCLOPramide (REGLAN) 5 MG tablet Take 1 tablet (5 mg total) by mouth 3 (three) times daily before meals.  . Multiple Vitamins-Minerals (HAIR VITAMINS PO) Take by mouth daily.  Marland Kitchen  naproxen (NAPROSYN) 500 MG tablet TAKE 1 TABLET DAILY  . NEXIUM 40 MG capsule TAKE 1 CAPSULE DAILY AT 12 NOON  . Probiotic Product (PROBIOTIC DAILY PO) Take by mouth.  . topiramate (TOPAMAX) 25 MG capsule Take 2 capsules (50 mg total) by mouth 2 (two) times daily.  . [DISCONTINUED] buPROPion (WELLBUTRIN XL) 150 MG 24 hr tablet Take 1 tablet (150 mg total) by mouth daily.  . [DISCONTINUED] FLUoxetine (PROZAC) 20 MG capsule  Take 3 capsules (60 mg total) by mouth daily.   No facility-administered encounter medications on file as of 11/09/2015.    Past Psychiatric History/Hospitalization(s): Anxiety: yes Bipolar Disorder: Yes Depression: yes Mania: No Psychosis: No Schizophrenia: No Personality Disorder: No Hospitalization for psychiatric illness: Yes History of Electroconvulsive Shock Therapy: No Prior Suicide Attempts: Yes  6 months ago OD'd no hospitalization  Physical Exam: Constitutional:  BP 124/70 mmHg  Pulse 81  Ht 5\' 3"  (1.6 m)  Wt 145 lb (65.772 kg)  BMI 25.69 kg/m2  SpO2 98%  General Appearance: alert, oriented, no acute distress ROS: no nausea, vomiting. No chest pain.  Musculoskeletal: Strength & Muscle Tone: within normal limits Gait & Station: normal Patient leans: N/A  Psychiatric: Speech (describe rate, volume, coherence, spontaneity, and abnormalities if any): clear and goal directed, normal rate volume and rhythm  Thought Process (describe rate, content, abstract reasoning, and computation): normal  Associations: Coherent and Relevant  Thoughts: normal  Mental Status: Orientation: oriented to person, place and time/date Mood & Affect: dysphoric with constricted affect Attention Span & Concentration: good  Medical Decision Making (Choose Three): Established Problem, Stable/Improving (1)  Assessment: Axis I: Bipolar MRE depressed, GAD. insomnia  Plan:  Bipolar: She is to be the most stabilizer we will consider option but she apparently is considerable break-in did most of the options. And has had side effects with others. We will start Topamax 25 mg increasing to 50 mg. She does have the benefit of continuing taking gabapentin for final multilevel reacting as a most stabilizer as well Depression: Continue Wellbutrin and Prozac not to increase the dose as of now Generalized anxiety disorder; we will work on mood  stabilization first reviewed stress tolerance and  coping skills. Insomnia: reviewed sleep hygiene. More than 50% of time spent in counseling and coordination of care including patient education and review/expectation from medications.  Call 911 and report to local emergency room for any urgent concerns of suicidal thoughts Follow-up's primary care physician regarding to her lab work. Patient is a non-nicotine user Follow up in 3 weeks.   Merian Capron, MD 11/09/2015

## 2015-11-12 ENCOUNTER — Ambulatory Visit: Payer: Self-pay | Admitting: Endocrinology

## 2015-11-16 ENCOUNTER — Ambulatory Visit: Payer: Self-pay | Admitting: Nutrition

## 2015-11-18 DIAGNOSIS — R05 Cough: Secondary | ICD-10-CM | POA: Diagnosis not present

## 2015-11-18 DIAGNOSIS — R1310 Dysphagia, unspecified: Secondary | ICD-10-CM | POA: Diagnosis not present

## 2015-11-23 ENCOUNTER — Telehealth: Payer: Self-pay | Admitting: Nutrition

## 2015-11-23 ENCOUNTER — Ambulatory Visit: Payer: Self-pay | Admitting: Nutrition

## 2015-11-23 NOTE — Telephone Encounter (Signed)
Patient cancelled appointment, she stated she didn't want to change her medication.

## 2015-11-25 ENCOUNTER — Encounter: Payer: Self-pay | Admitting: Endocrinology

## 2015-11-25 ENCOUNTER — Ambulatory Visit (INDEPENDENT_AMBULATORY_CARE_PROVIDER_SITE_OTHER): Payer: Medicare Other | Admitting: Endocrinology

## 2015-11-25 VITALS — BP 104/62 | HR 83 | Temp 97.8°F | Resp 14 | Ht 63.0 in | Wt 142.6 lb

## 2015-11-25 DIAGNOSIS — E1065 Type 1 diabetes mellitus with hyperglycemia: Secondary | ICD-10-CM | POA: Diagnosis not present

## 2015-11-25 LAB — POCT GLYCOSYLATED HEMOGLOBIN (HGB A1C): Hemoglobin A1C: 7.9

## 2015-11-25 MED ORDER — INSULIN DEGLUDEC 100 UNIT/ML ~~LOC~~ SOPN
35.0000 [IU] | PEN_INJECTOR | Freq: Every day | SUBCUTANEOUS | Status: DC
Start: 2015-11-25 — End: 2015-11-30

## 2015-11-25 NOTE — Progress Notes (Signed)
Patient ID: Sheryl Matthews, female   DOB: 09/25/1966, 49 y.o.   MRN: [id number]          Reason for Appointment : for Type 1 Diabetes  History of Present Illness          Diagnosis: Type 1 diabetes mellitus, date of diagnosis:  at age 67    Previous history:   She has been on insulin since the time of diagnosis. Also appears to be on the same program of NPH and Regular Insulin since diagnosis was made  INSULIN regimen is described as: 10R 32N about 30 min ac breakfast    Recent history:   She has not been seen in follow-up since her initial consultation in 08/2015.  Because of poor control with her NPH and regular regimen she was told to start Toujeo insulin along with Novolog She did not get this on her insurance and was given Lantus instead However she was not wanting to take this regimen and went back to her NPH and regular regimen on her own  Glucose patterns, management and problems identified:      FASTING blood sugars are very inconsistent but mostly high and averaging 240 or so  She does not want to take insulin in the evening and is taking NPH only once a day in the morning  She has occasional readings AFTER breakfast which are variably high  She thinks her blood sugar tends to get low if she is exercising in the mornings at 9 AM on 3 days a week and will need more to prevent this.  She has very few readings in the afternoons and evenings  Recently afternoon blood sugars are relatively lower  Readings at bedtime are quite variable  She thinks her appetite is decreased and she is eating mostly breakfast and later in the day will have mostly snacks because of abdominal discomfort and nausea  She is able to eat only small portions at meals and intake is variable    She is going to the gym 3 times a week for 2 hours for exercise     Glucose monitoring:  is being done  2-3 times a day         Glucometer:  Freestyle .      Blood Glucose readings from meter  download:  Mean values apply above for all meters except median for One Touch  PRE-MEAL Fasting Lunch Dinner Bedtime Overall  Glucose range:  90-285  209-368   47-133   49-387    Mean/median:  244      222    Hypoglycemia:  sporadically recently Factors causing hyperglycemia: Reduced food intake, exercise Symptoms of hypoglycemia: Weakness, dizziness, difficulty thinking, feels sweaty and shaky only if blood sugar in the 30s.  May recognize symptoms low 60 at times but inconsistently.  Has had seizures during the night from hypoglycemia Treatment of hypoglycemia: juice, peanut butter, Glucagon     Diabetes labs:  Lab Results  Component Value Date   HGBA1C 7.9 11/25/2015   HGBA1C 8.4* 06/07/2015   Lab Results  Component Value Date   LDLCALC 86 07/21/2015   CREATININE 0.70 07/21/2015      Medication List       This list is accurate as of: 11/25/15  9:06 PM.  Always use your most recent med list.               ALPRAZolam 0.25 MG tablet  Commonly known as:  XANAX  Take 1 tablet (  0.25 mg total) by mouth as needed for anxiety.     buPROPion 150 MG 24 hr tablet  Commonly known as:  WELLBUTRIN XL  Take 1 tablet (150 mg total) by mouth daily.     fexofenadine 180 MG tablet  Commonly known as:  ALLEGRA  Take 1 tablet (180 mg total) by mouth daily.     FLUoxetine 20 MG capsule  Commonly known as:  PROZAC  Take 3 capsules (60 mg total) by mouth daily.     fluticasone 50 MCG/ACT nasal spray  Commonly known as:  FLONASE  Place 1 spray into both nostrils daily.     gabapentin 300 MG capsule  Commonly known as:  NEURONTIN  TAKE 1 CAPSULE DAILY.     glucagon 1 MG injection  Inject 1 mg intramuscularly for severe hypoglycemia     glucose blood test strip  Commonly known as:  FREESTYLE LITE  Use as instructed to check blood sugar 6 times per day     HAIR VITAMINS PO  Take by mouth daily.     insulin aspart 100 UNIT/ML FlexPen  Commonly known as:  NOVOLOG FLEXPEN   Inject 4-6 units three times a day with meals     Insulin Glargine 100 UNIT/ML Solostar Pen  Commonly known as:  LANTUS SOLOSTAR  Inject 20 Units into the skin 2 (two) times daily.     Insulin Glargine 300 UNIT/ML Sopn  Commonly known as:  TOUJEO SOLOSTAR  Inject 40 Units into the skin daily.     insulin NPH Human 100 UNIT/ML injection  Commonly known as:  HUMULIN N,NOVOLIN N  Inject 32 units every morning and 18 units every evening     Insulin Pen Needle 32G X 4 MM Misc  Commonly known as:  BD PEN NEEDLE NANO U/F  Use 4 per day to inject insulin     insulin regular 100 units/mL injection  Commonly known as:  NOVOLIN R,HUMULIN R  Inject 10 units every morning and 4 units every evening     metoCLOPramide 5 MG tablet  Commonly known as:  REGLAN  Take 1 tablet (5 mg total) by mouth 3 (three) times daily before meals.     naproxen 500 MG tablet  Commonly known as:  NAPROSYN  TAKE 1 TABLET DAILY     NEXIUM 40 MG capsule  Generic drug:  esomeprazole  TAKE 1 CAPSULE DAILY AT 12 NOON     PROBIOTIC DAILY PO  Take by mouth.     topiramate 25 MG capsule  Commonly known as:  TOPAMAX  Take 2 capsules (50 mg total) by mouth 2 (two) times daily.     VANICREAM EX  Apply topically as needed.        Allergies:  Allergies  Allergen Reactions  . Latex Rash  . Tape Rash    Past Medical History  Diagnosis Date  . Diabetes mellitus without complication ([place])     No past surgical history on file.  Family History  Problem Relation Age of Onset  . Depression Sister   . Alcohol abuse Brother   . Diabetes Brother     Social History:  reports that she has never smoked. She has never used smokeless tobacco. She reports that she does not drink alcohol or use illicit drugs.    Review of Systems   Review of Systems  HENT:       Running nose from allergies, treated with Flonase  Gastrointestinal: Positive for abdominal pain.  She has had mid to upper abdominal pain  for about a year which is frequently when she tries to eat  Psychiatric/Behavioral: Positive for depression.        Lipids: Not on any statins  Lab Results  Component Value Date   CHOL 182 07/21/2015   HDL 83 07/21/2015   LDLCALC 86 07/21/2015   TRIG 64 07/21/2015   CHOLHDL 2.2 07/21/2015      She still has some nausea and abdominal discomfort and is awaiting GI consultation.  Has not started Reglan that was recommended last time       She has a history of Numbness 1 and, tingling in her feet for about a year.  No significant pain   LABS:  Office Visit on 11/25/2015  Component Date Value Ref Range Status  . Hemoglobin A1C 11/25/2015 7.9   Final    Physical Examination:  BP 104/62 mmHg  Pulse 83  Temp(Src) 97.8 F (36.6 C)  Resp 14  Ht 5\' 3"  (1.6 m)  Wt 142 lb 9.6 oz (64.683 kg)  BMI 25.27 kg/m2  SpO2 96%    ASSESSMENT:  Diabetes type 1, long-standing and poorly controlled She is on a regimen of NPH and regular insulin with syringe and vial, taking at breakfast only She is having marked variability in blood sugars but mostly high fasting readings and she takes NPH only in the morning and not at night as before She is significantly concerned about possibly of hypoglycemia with any regimen Although she was recommended basal bolus insulin regimen with Lantus and Novolog in the last visit 2 months ago she has not done this Today is more open to change and is wanting to try the new regimen with Lantus and Novolog, Tyler Aas not covered by insurance She is not willing to take Lantus more than once a day Not clear if Tyler Aas is covered  Today she expresses that she has difficulty following instructions unless they are written out explicitly and not able to keep up with any complicated regimen Has had minimal diabetes education are not familiar with carbohydrate counting or the types of insulin regimens She appears interested in insulin pump and continuous glucose  monitoring but probably need considerable education before she can do this  She may have gastroparesis and this will make her control relatively more difficult also She will see a gastroenterologist soon for this  PLAN:   Stop taking NPH and Regular Insulin and start basal bolus insulin regimen. She will start Lantus that she has at home Also since she is taking 32 NPH daily without adequate amount of basal insulin available in the 24 hours will start with at least 28 of Lantus Discussed titrating this every 3-4 days to get blood sugars at least under 150 and written instructions given She will also start taking about 5 units of Novolog if eating lunch or dinner which she is consistently not doing Given basic information on insulin adjustment based on carbohydrates She will try to reduce insulin 50% when going for exercise in the morning More consistent monitoring after meals especially lunch and supper   Patient Instructions  Check blood sugars on waking up 3  times a week Also check blood sugars about 2 hours after a meal and do this after different meals by rotation  Recommended blood sugar levels on waking up is 90-130 and about 2 hours after meal is 130-160  Please bring your blood sugar monitor to each visit, thank you   MORNING  insulin doses:  LANTUS, taking 28 units on waking up.  If after 3-4 days your morning sugars are still over 150 consistently go up to units every 3-4 days However if blood sugars are low before supper please notify me  NOVOLOG insulin: Take 10 units a few minutes before breakfast but on Monday/Wednesday/Friday when exercising take only 5 units Please try to have a protein at each meal including breakfast such as an egg, low-fat cheese or meat or almonds  LUNCH and dinner: If eating a sandwich or any significant carbohydrate at lunch or dinner take 5 units of Novolog right before eating Nurse educator will discuss carbohydrate counting with  you.     Counseling time on subjects discussed above is over 50% of today's 25 minute visit    Virgilio Broadhead 11/25/2015, 9:06 PM   Note: This note was prepared with Estate agent. Any transcriptional errors that result from this process are unintentional.

## 2015-11-25 NOTE — Patient Instructions (Addendum)
Check blood sugars on waking up 3  times a week Also check blood sugars about 2 hours after a meal and do this after different meals by rotation  Recommended blood sugar levels on waking up is 90-130 and about 2 hours after meal is 130-160  Please bring your blood sugar monitor to each visit, thank you   MORNING insulin doses:  LANTUS, taking 28 units on waking up.  If after 3-4 days your morning sugars are still over 150 consistently go up to units every 3-4 days However if blood sugars are low before supper please notify me  NOVOLOG insulin: Take 10 units a few minutes before breakfast but on Monday/Wednesday/Friday when exercising take only 5 units Please try to have a protein at each meal including breakfast such as an egg, low-fat cheese or meat or almonds  LUNCH and dinner: If eating a sandwich or any significant carbohydrate at lunch or dinner take 5 units of Novolog right before eating Nurse educator will discuss carbohydrate counting with you.

## 2015-11-30 ENCOUNTER — Telehealth: Payer: Self-pay | Admitting: Endocrinology

## 2015-11-30 ENCOUNTER — Other Ambulatory Visit: Payer: Self-pay | Admitting: *Deleted

## 2015-11-30 ENCOUNTER — Encounter: Payer: Medicare Other | Attending: Endocrinology | Admitting: Nutrition

## 2015-11-30 DIAGNOSIS — E1065 Type 1 diabetes mellitus with hyperglycemia: Secondary | ICD-10-CM | POA: Insufficient documentation

## 2015-11-30 DIAGNOSIS — Z713 Dietary counseling and surveillance: Secondary | ICD-10-CM | POA: Insufficient documentation

## 2015-11-30 MED ORDER — INSULIN DEGLUDEC 100 UNIT/ML ~~LOC~~ SOPN: 20.0000 [IU] | PEN_INJECTOR | Freq: Every day | SUBCUTANEOUS | Status: DC

## 2015-11-30 NOTE — Telephone Encounter (Signed)
Pt was rx a med for stomach pain, she would like to see if we can call in another rx to the [facility] in [place]

## 2015-11-30 NOTE — Telephone Encounter (Signed)
She was given Reglan for nausea not stomach pain.  She is supposed to be seeing gastroenterologist  Apparently since she is not taking Lantus would like to postpone her visit of 1/5 and have her try Tresiba 20 units daily first if covered and see me a week later

## 2015-11-30 NOTE — Telephone Encounter (Signed)
Noted, she has an appointment with GI on 01/09, tresiba was called in and she will call back for an appointment.

## 2015-11-30 NOTE — Telephone Encounter (Signed)
Please see below and advise.

## 2015-12-01 NOTE — Patient Instructions (Signed)
Always take NPH ac Supper Discuss moving the N to bedtime when you see Dr. Dwyane Dee on Wednesday Have protein with all meals.

## 2015-12-01 NOTE — Progress Notes (Signed)
Patient was in with her husband to review blood sugars. She reports that she stopped the Lantus 2 days ago due to itching.  Her husband admits that she has stopped her Allegra medication, and itching is a side effect of this action.  She is currently taking N and R, mixed 30 min. Before breakfast and supper.  Other problems identified: 1. She sometimes does not take her N in the evenings acS, she did this last night, because her blood sugar acS was 67.  FBS today was 187, and after a correction bolus did not come down 2 hours later with no breakfast I discussed the idea of basal bolus insulin, and the reason that N is taken in the evening--and because she did not take this, her R was used as the basal insulin that was needed.  She reported good understanding of this, and agreed to take a long acting again, after seeing Dr. Dwyane Dee on Wednesday. Information given of [place] and Toujeo. 2.  We also discussed the timings of the insulin.  She had no idea of this.  She was shown on a graph that the insulins she takes overlap and can cause prolonged low blood sugars and then not enough insulin in the early AM, because she takes her N acS in stead of at HS--this causing lows at 1AM.   3.  She exercises for 2 hours after breakfast, and reduces her R in the AM, but not her NPH--she is having lows acL 4  Meals contain no protein some mornings and other times like this AM, she had none.  I explained tht because her insulin lasts 6 hours, she needs to have protein to prevent low blood sugars before the next meal time. 5.  She has hardened areas in her injection sites in the abdomen, as well as the arm.  I discussed that she needs to stay completely away from these areas, and gave her new sites to use.  Meanwhile, she can massage those scare tissue areas each evening with lotion for 15 min., and maybe in 2 months try to reuse those areas. We discussed pump therapy, and she was shown some different pumps to try.  We  discussed the advantages and disadvantages of pump therapy, and she was given information on each model to take home and review.   She is seeing Dr. Dwyane Dee on Wednesday to review blood sugars.  Patient was very overwhelmed with all of this new information, but wrote everything down that was discussed.  She will call if questions.

## 2015-12-02 ENCOUNTER — Encounter: Payer: Self-pay | Admitting: Endocrinology

## 2015-12-02 ENCOUNTER — Other Ambulatory Visit: Payer: Self-pay | Admitting: *Deleted

## 2015-12-02 ENCOUNTER — Ambulatory Visit: Payer: Self-pay | Admitting: Endocrinology

## 2015-12-02 ENCOUNTER — Ambulatory Visit (INDEPENDENT_AMBULATORY_CARE_PROVIDER_SITE_OTHER): Payer: Medicare Other | Admitting: Endocrinology

## 2015-12-02 VITALS — BP 126/70 | HR 90 | Temp 97.7°F | Resp 14 | Ht 64.0 in | Wt 142.8 lb

## 2015-12-02 DIAGNOSIS — E1065 Type 1 diabetes mellitus with hyperglycemia: Secondary | ICD-10-CM | POA: Diagnosis not present

## 2015-12-02 MED ORDER — INSULIN ASPART 100 UNIT/ML ~~LOC~~ SOLN: 10.0000 [IU] | Freq: Three times a day (TID) | SUBCUTANEOUS | Status: DC

## 2015-12-02 NOTE — Progress Notes (Signed)
Patient ID: Sheryl Matthews, female   DOB: [birth year], 50 y.o.   MRN: [id number]          Reason for Appointment : Follow-up for Type 1 Diabetes  History of Present Illness          Diagnosis: Type 1 diabetes mellitus, date of diagnosis:  at age 30    Previous history:   She has been on insulin since the time of diagnosis. Also appears to be on the same program of NPH and Regular Insulin since diagnosis was made  INSULIN regimen is described as: 10R 32N about 30 min ac breakfast    Recent history:    Because of poor control with her NPH and regular regimen she was told to start Tresiba insulin along with Novolog but this was not covered Although she did start taking Lantus she started itching with this and did not continue.  She normally takes Allegra but she is not taking this because of pending allergy testing  Glucose patterns, management and problems identified:      FASTING blood sugars are usually high and have been ranging from 120-263 recently  After 2-3 days of taking the Lantus her morning sugar was starting to improve  More recently she has been taking mostly 16 units of NPH insulin which she started back on 11/29/15, once she took 20 units and this morning she took 32 units  She has not taken any NPH at bedtime  She still and does not understand actions of the insulins and has already seen the nurse educator and specific instructions were given  She wants to try the insulin pump  Her insurance had not covered continuous glucose monitoring previously   She has taken regular insulin at breakfast and has reduced it to 5 units when exercising  Recently afternoon blood sugars are consistently lower and usually gets some degree of hypoglycemia every day between 11 AM-5 PM  Readings at bedtime are relatively better except on couple of occasions and once had hypoglycemia  She is able to eat only small portions at meals and intake is variable    Glucose monitoring:  is  being done recently up to 9 times a day         Glucometer:  Freestyle .      Blood Glucose readings from meter download as above  Average readings overnight for the last 14 days range from 207-219 Average between 2-4 PM = 71 and overall average 148   Hypoglycemia:   recently almost daily in the afternoon as above Factors causing hyperglycemia: Reduced food intake, exercise Symptoms of hypoglycemia: Weakness, dizziness, difficulty thinking, feels sweaty and shaky only if blood sugar in the 30s.  May recognize symptoms low 60 at times but inconsistently.  Has had seizures during the night from hypoglycemia Treatment of hypoglycemia: juice, peanut butter, Glucagon     Diabetes labs:  Lab Results  Component Value Date   HGBA1C 7.9 11/25/2015   HGBA1C 8.4* 06/07/2015   Lab Results  Component Value Date   LDLCALC 86 07/21/2015   CREATININE 0.70 07/21/2015      Medication List       This list is accurate as of: 12/02/15  4:30 PM.  Always use your most recent med list.               ALPRAZolam 0.25 MG tablet  Commonly known as:  XANAX  Take 1 tablet (0.25 mg total) by mouth as needed for anxiety.  buPROPion 150 MG 24 hr tablet  Commonly known as:  WELLBUTRIN XL  Take 1 tablet (150 mg total) by mouth daily.     fexofenadine 180 MG tablet  Commonly known as:  ALLEGRA  Take 1 tablet (180 mg total) by mouth daily.     FLUoxetine 20 MG capsule  Commonly known as:  PROZAC  Take 3 capsules (60 mg total) by mouth daily.     fluticasone 50 MCG/ACT nasal spray  Commonly known as:  FLONASE  Place 1 spray into both nostrils daily.     gabapentin 300 MG capsule  Commonly known as:  NEURONTIN  TAKE 1 CAPSULE DAILY.     glucagon 1 MG injection  Inject 1 mg intramuscularly for severe hypoglycemia     glucose blood test strip  Commonly known as:  FREESTYLE LITE  Use as instructed to check blood sugar 6 times per day     HAIR VITAMINS PO  Take by mouth daily.     insulin  aspart 100 UNIT/ML FlexPen  Commonly known as:  NOVOLOG FLEXPEN  Inject 4-6 units three times a day with meals     Insulin Glargine 100 UNIT/ML Solostar Pen  Commonly known as:  LANTUS SOLOSTAR  Inject 20 Units into the skin 2 (two) times daily.     insulin NPH Human 100 UNIT/ML injection  Commonly known as:  HUMULIN N,NOVOLIN N  Inject 32 units every morning and 18 units every evening     Insulin Pen Needle 32G X 4 MM Misc  Commonly known as:  BD PEN NEEDLE NANO U/F  Use 4 per day to inject insulin     insulin regular 100 units/mL injection  Commonly known as:  NOVOLIN R,HUMULIN R  Inject 10 units every morning and 4 units every evening     metoCLOPramide 5 MG tablet  Commonly known as:  REGLAN  Take 1 tablet (5 mg total) by mouth 3 (three) times daily before meals.     naproxen 500 MG tablet  Commonly known as:  NAPROSYN  TAKE 1 TABLET DAILY     NEXIUM 40 MG capsule  Generic drug:  esomeprazole  TAKE 1 CAPSULE DAILY AT 12 NOON     PROBIOTIC DAILY PO  Take by mouth.     topiramate 25 MG capsule  Commonly known as:  TOPAMAX  Take 2 capsules (50 mg total) by mouth 2 (two) times daily.     VANICREAM EX  Apply topically as needed.        Allergies:  Allergies  Allergen Reactions  . Latex Rash  . Tape Rash    Past Medical History  Diagnosis Date  . Diabetes mellitus without complication ([place])     No past surgical history on file.  Family History  Problem Relation Age of Onset  . Depression Sister   . Alcohol abuse Brother   . Diabetes Brother     Social History:  reports that she has never smoked. She has never used smokeless tobacco. She reports that she does not drink alcohol or use illicit drugs.    Review of Systems   ROS      Lipids: Not on any statins  Lab Results  Component Value Date   CHOL 182 07/21/2015   HDL 83 07/21/2015   LDLCALC 86 07/21/2015   TRIG 64 07/21/2015   CHOLHDL 2.2 07/21/2015      She still has some nausea and  abdominal discomfort and is awaiting GI consultation.  Has not started Reglan that was recommended last time       She has a history of Numbness 1 and, tingling in her feet for about a year.    LABS:  No visits with results within 1 Week(s) from this visit. Latest known visit with results is:  Office Visit on 11/25/2015  Component Date Value Ref Range Status  . Hemoglobin A1C 11/25/2015 7.9   Final    Physical Examination:  BP 126/70 mmHg  Pulse 90  Temp(Src) 97.7 F (36.5 C)  Resp 14  Ht 5\' 4"  (1.626 m)  Wt 142 lb 12.8 oz (64.774 kg)  BMI 24.50 kg/m2  SpO2 95%    ASSESSMENT:  Diabetes type 1, long-standing and poorly controlled She is on a regimen of NPH and regular insulin with syringe and vial, taking at breakfast only Although she probably can do better with Lantus she wants to afford off on this until she can take Allegra again, she believes she has had some itching with this  For now will have her take NPH twice a day in reduced doses Even with taking 16 units of NPH she tends to have relatively low sugars in the afternoons fairly consistently high readings fasting She is still interested in doing continuous glucose monitoring, previously this was not covered by insurance Also wants to try the insulin pump  PLAN:   Detailed instructions on insulin as below She will stay on NPH and reduced doses but split the dose to twice a day Change Regular Insulin to Novolog Discussed timing and duration of action of Regular Insulin versus Novolog She will take only 8 units for breakfast unless blood sugars are high  Consider switching back to Lantus when she is able to take Allegra Information given on CGM May reduce frequency of glucose monitoring   Patient Instructions  MORNING doses:  NPH insulin:take   14 units in the morning regardless of blood sugar Daily      REGULAR INSULIN: Take 5 units in the morning when  exercising and 8 units when not exercising.   May take 2units more if blood sugars are over 180    SUPPERTIME  If eating a meal take 5 units of regular insulin or Novolog.  May also need to take this if eating lunch.  BEDTIME: Take 4 units of NPH right at bedtime, may have a snack also regardless of blood sugar   Counseling time on subjects discussed above is over 50% of today's 25 minute visit    Cache Decoursey 12/02/2015, 4:30 PM   Note: This note was prepared with Dragon voice recognition system technology. Any transcriptional errors that result from this process are unintentional.

## 2015-12-02 NOTE — Patient Instructions (Signed)
MORNING doses:  NPH insulin:take   14 units in the morning regardless of blood sugar Daily      REGULAR INSULIN: Take 5 units in the morning when  exercising and 8 units when not exercising.  May take 2units more if blood sugars are over 180    SUPPERTIME  If eating a meal take 5 units of regular insulin or Novolog.  May also need to take this if eating lunch.  BEDTIME: Take 4 units of NPH right at bedtime, may have a snack also regardless of blood sugar

## 2015-12-03 DIAGNOSIS — M898X6 Other specified disorders of bone, lower leg: Secondary | ICD-10-CM | POA: Diagnosis not present

## 2015-12-03 DIAGNOSIS — M25572 Pain in left ankle and joints of left foot: Secondary | ICD-10-CM | POA: Diagnosis not present

## 2015-12-06 DIAGNOSIS — R1084 Generalized abdominal pain: Secondary | ICD-10-CM | POA: Diagnosis not present

## 2015-12-06 DIAGNOSIS — R112 Nausea with vomiting, unspecified: Secondary | ICD-10-CM | POA: Diagnosis not present

## 2015-12-06 DIAGNOSIS — R1310 Dysphagia, unspecified: Secondary | ICD-10-CM | POA: Diagnosis not present

## 2015-12-06 DIAGNOSIS — R634 Abnormal weight loss: Secondary | ICD-10-CM | POA: Diagnosis not present

## 2015-12-07 ENCOUNTER — Encounter (HOSPITAL_COMMUNITY): Payer: Self-pay | Admitting: Psychiatry

## 2015-12-07 ENCOUNTER — Ambulatory Visit (INDEPENDENT_AMBULATORY_CARE_PROVIDER_SITE_OTHER): Payer: Medicare Other | Admitting: Psychiatry

## 2015-12-07 VITALS — BP 120/66 | HR 91 | Ht 64.0 in | Wt 142.0 lb

## 2015-12-07 DIAGNOSIS — G4700 Insomnia, unspecified: Secondary | ICD-10-CM

## 2015-12-07 DIAGNOSIS — F411 Generalized anxiety disorder: Secondary | ICD-10-CM

## 2015-12-07 DIAGNOSIS — F3130 Bipolar disorder, current episode depressed, mild or moderate severity, unspecified: Secondary | ICD-10-CM | POA: Diagnosis not present

## 2015-12-07 NOTE — Progress Notes (Signed)
Patient ID: Sheryl Matthews, female   DOB: [birth year], 50 y.o.   MRN: [id number]  [place] [id number] Progress Note  [place] [id number] 50 y.o.  12/07/2015 2:53 PM  Chief Complaint:  Bipolar disorder MRE depressed History of Present Illness:  Patient has been diagnosed with bipolar for many years. She has been sensitive to sulfa medications currently she is on Wellbutrin Prozac.  Bipolar; still feels down. Last visit we started Topamax for bipolar. She felt nausea on it and decreased appetite. She does not want to continue. She went back to gabapentin is taking 300 mg at night she does feel somewhat tired and at times low but she also believes it was related to holidays   In the past she has had manic episodes when she would do in discrete sex and sees excessive energy and decreased need for sleep in the beginning is difficult for her husband to understand what she is going through Insomnia: variable but better. Aggravating factors; stress and poor sleep Modifying factors; supportive husband and going to gym Severity of depression; 5 out of 10. 10 being no depression Context; stress poor sleep Duration; more than 10 years No history of psychotic symptoms currently. Denies drug use or ABUSE of alcohol as of now   Suicidal Ideation: No Plan Formed: No Patient has means to carry out plan: No  Homicidal Ideation: No Plan Formed: No Patient has means to carry out plan: No  Neurologic: Headache: No Seizure: No Paresthesias: No  Past Medical Family, Social History: Husband is finishing his deployment and will be retirement next week. She is glad that he is home, but also, a little anxious about it as well.  Outpatient Encounter Prescriptions as of 12/07/2015  Medication Sig  . ALPRAZolam (XANAX) 0.25 MG tablet Take 1 tablet (0.25 mg total) by mouth as needed for anxiety.  Marland Kitchen buPROPion (WELLBUTRIN XL) 150 MG 24 hr tablet Take 1 tablet (150 mg total) by mouth daily.  .  Emollient (VANICREAM EX) Apply topically as needed.  . fexofenadine (ALLEGRA) 180 MG tablet Take 1 tablet (180 mg total) by mouth daily.  Marland Kitchen FLUoxetine (PROZAC) 20 MG capsule Take 3 capsules (60 mg total) by mouth daily.  . fluticasone (FLONASE) 50 MCG/ACT nasal spray Place 1 spray into both nostrils daily.  Marland Kitchen gabapentin (NEURONTIN) 300 MG capsule TAKE 1 CAPSULE DAILY.  Marland Kitchen glucagon 1 MG injection Inject 1 mg intramuscularly for severe hypoglycemia  . glucose blood (FREESTYLE LITE) test strip Use as instructed to check blood sugar 6 times per day  . insulin aspart (NOVOLOG) 100 UNIT/ML injection Inject 10 Units into the skin 3 (three) times daily before meals.  . Insulin Glargine (LANTUS SOLOSTAR) 100 UNIT/ML Solostar Pen Inject 20 Units into the skin 2 (two) times daily.  . insulin NPH Human (HUMULIN N,NOVOLIN N) 100 UNIT/ML injection Inject 32 units every morning and 18 units every evening  . Insulin Pen Needle (BD PEN NEEDLE NANO U/F) 32G X 4 MM MISC Use 4 per day to inject insulin  . insulin regular (NOVOLIN R,HUMULIN R) 100 units/mL injection Inject 10 units every morning and 4 units every evening  . metoCLOPramide (REGLAN) 5 MG tablet Take 1 tablet (5 mg total) by mouth 3 (three) times daily before meals.  . Multiple Vitamins-Minerals (HAIR VITAMINS PO) Take by mouth daily.  . naproxen (NAPROSYN) 500 MG tablet TAKE 1 TABLET DAILY  . NEXIUM 40 MG capsule TAKE 1 CAPSULE DAILY AT 12 NOON  . Probiotic Product (  PROBIOTIC DAILY PO) Take by mouth.  . [DISCONTINUED] topiramate (TOPAMAX) 25 MG capsule Take 2 capsules (50 mg total) by mouth 2 (two) times daily. (Patient not taking: Reported on 12/07/2015)   No facility-administered encounter medications on file as of 12/07/2015.    Past Psychiatric History/Hospitalization(s): Anxiety: yes Bipolar Disorder: Yes Depression: yes Mania: No Psychosis: No Schizophrenia: No Personality Disorder: No Hospitalization for psychiatric illness: Yes History  of Electroconvulsive Shock Therapy: No Prior Suicide Attempts: Yes  6 months ago OD'd no hospitalization  Physical Exam: Constitutional:  BP 120/66 mmHg  Pulse 91  Ht 5\' 4"  (1.626 m)  Wt 142 lb (64.411 kg)  BMI 24.36 kg/m2  SpO2 97%  General Appearance: alert, oriented, no acute distress ROS: no nausea, vomiting. No chest pain.  Musculoskeletal: Strength & Muscle Tone: within normal limits Gait & Station: normal Patient leans: N/A  Psychiatric: Speech (describe rate, volume, coherence, spontaneity, and abnormalities if any): clear and goal directed, normal rate volume and rhythm  Thought Process (describe rate, content, abstract reasoning, and computation): normal  Associations: Coherent and Relevant  Thoughts: normal  Mental Status: Orientation: oriented to person, place and time/date Mood & Affect: dysphoric with constricted affect Attention Span & Concentration: good No psychotic symptoms. Alert and oriented with reasonable judjement.     Assessment: Axis I: Bipolar MRE depressed, GAD. insomnia  Plan:  Bipolar:  She was to continue gabapentin only as a mood stabilizer. Understands it can cause some tiredness she can increase the dose if needed to 2 tablets or capsules  Depression: Continue Wellbutrin and Prozac not to increase the dose as of now Generalized anxiety disorder;  Continue prozac and coping skills Insomnia: reviewed sleep hygiene. More than 50% of time spent in counseling and coordination of care including patient education and review/expectation from medications.  Call 911 and report to local emergency room for any urgent concerns of suicidal thoughts Follow-up's primary care physician regarding to her lab work. Patient is a non-nicotine user Follow up in 4  weeks.   Merian Capron, MD 12/07/2015

## 2015-12-09 DIAGNOSIS — J301 Allergic rhinitis due to pollen: Secondary | ICD-10-CM | POA: Diagnosis not present

## 2015-12-09 DIAGNOSIS — R05 Cough: Secondary | ICD-10-CM | POA: Diagnosis not present

## 2015-12-13 DIAGNOSIS — K219 Gastro-esophageal reflux disease without esophagitis: Secondary | ICD-10-CM | POA: Diagnosis not present

## 2015-12-13 DIAGNOSIS — R1310 Dysphagia, unspecified: Secondary | ICD-10-CM | POA: Diagnosis not present

## 2015-12-13 DIAGNOSIS — B9681 Helicobacter pylori [H. pylori] as the cause of diseases classified elsewhere: Secondary | ICD-10-CM | POA: Diagnosis not present

## 2015-12-13 DIAGNOSIS — K2970 Gastritis, unspecified, without bleeding: Secondary | ICD-10-CM | POA: Diagnosis not present

## 2015-12-13 DIAGNOSIS — A048 Other specified bacterial intestinal infections: Secondary | ICD-10-CM | POA: Diagnosis not present

## 2015-12-14 DIAGNOSIS — Z124 Encounter for screening for malignant neoplasm of cervix: Secondary | ICD-10-CM | POA: Diagnosis not present

## 2015-12-14 DIAGNOSIS — Z01419 Encounter for gynecological examination (general) (routine) without abnormal findings: Secondary | ICD-10-CM | POA: Diagnosis not present

## 2015-12-14 DIAGNOSIS — Z1151 Encounter for screening for human papillomavirus (HPV): Secondary | ICD-10-CM | POA: Diagnosis not present

## 2015-12-15 DIAGNOSIS — R112 Nausea with vomiting, unspecified: Secondary | ICD-10-CM | POA: Diagnosis not present

## 2015-12-16 ENCOUNTER — Ambulatory Visit: Payer: Self-pay | Admitting: Endocrinology

## 2015-12-17 ENCOUNTER — Ambulatory Visit (INDEPENDENT_AMBULATORY_CARE_PROVIDER_SITE_OTHER): Payer: Medicare Other

## 2015-12-17 ENCOUNTER — Encounter: Payer: Self-pay | Admitting: Podiatry

## 2015-12-17 ENCOUNTER — Ambulatory Visit (INDEPENDENT_AMBULATORY_CARE_PROVIDER_SITE_OTHER): Payer: Medicare Other | Admitting: Podiatry

## 2015-12-17 ENCOUNTER — Encounter: Payer: Self-pay | Admitting: Dietician

## 2015-12-17 VITALS — BP 108/59 | HR 100 | Resp 18

## 2015-12-17 DIAGNOSIS — M205X9 Other deformities of toe(s) (acquired), unspecified foot: Secondary | ICD-10-CM | POA: Diagnosis not present

## 2015-12-17 DIAGNOSIS — L84 Corns and callosities: Secondary | ICD-10-CM | POA: Diagnosis not present

## 2015-12-17 DIAGNOSIS — M2010 Hallux valgus (acquired), unspecified foot: Secondary | ICD-10-CM | POA: Diagnosis not present

## 2015-12-17 DIAGNOSIS — R52 Pain, unspecified: Secondary | ICD-10-CM

## 2015-12-17 NOTE — Progress Notes (Signed)
   Subjective:    Patient ID: Sheryl Matthews, female    DOB: [birth year], 50 y.o.   MRN: [id number]  HPI  50 year old female presents the office today with concerns of bilateral bunions which has been ongoing for several years and been progressive. She is previously scheduled for surgery several years ago but she did not go forward with the surgery. She states that she does start to have more pain over on the bunion sites was in his shoe gear pressure. No tingling or numbness. She said no previous treatment. She also states that both of her fifth digit toenails were removed previously multiple times as they were hitting her shoes and she states that he continues to grow back. No swelling or redness or any drainage from the area. No other complaints at this time.   Review of Systems  All other systems reviewed and are negative.      Objective:   Physical Exam General: AAO x3, NAD  Dermatological: Hyperkeratotic lesions overlying the fifth digit toenails. There is very little nail present. Upon [place] there is no underlying ulceration, drainage or other signs of infection. No other open lesions identified bilaterally.  Vascular: Dorsalis Pedis artery and Posterior Tibial artery pedal pulses are 2/4 bilateral with immedate capillary fill time. Pedal hair growth present. No varicosities and no lower extremity edema present bilateral. There is no pain with calf compression, swelling, warmth, erythema.   Neruologic: Grossly intact via light touch bilateral. Vibratory intact via tuning fork bilateral. Protective threshold with Semmes Wienstein monofilament intact to all pedal sites bilateral. Patellar and Achilles deep tendon reflexes 2+ bilateral. No Babinski or clonus noted bilateral.   Musculoskeletal: HAV is present bilaterally with prominence of the first metatarsal head medially and lateral deviation of the hallux. There is no hypermobility present. No pain or crepitation with first MTPJ range of  motion. Adductovarus of bilateral fifth digits. MMT 5/5, ROM WNL  Gait: Unassisted, Nonantalgic.         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 Mild to moderate HAV is present -Etiology of symptoms were discussed -Would hold off on surgery for now as her sugar has been elevated. For no continue conservative treatment including shoe changes, padding, offloading, and orthotics -Hypkerkerotic lesions to bilateral 5th digits debrided without complication. Offloading -Follow-up as needed. In the meantime, encouraged to call the office with any questions, concerns, change in symptoms.   Celesta Gentile, DPM

## 2015-12-18 LAB — HM PAP SMEAR: HM Pap smear: NEGATIVE

## 2015-12-20 DIAGNOSIS — M2010 Hallux valgus (acquired), unspecified foot: Secondary | ICD-10-CM | POA: Insufficient documentation

## 2015-12-20 DIAGNOSIS — M205X9 Other deformities of toe(s) (acquired), unspecified foot: Secondary | ICD-10-CM | POA: Insufficient documentation

## 2015-12-23 DIAGNOSIS — E109 Type 1 diabetes mellitus without complications: Secondary | ICD-10-CM | POA: Diagnosis not present

## 2015-12-23 DIAGNOSIS — H527 Unspecified disorder of refraction: Secondary | ICD-10-CM | POA: Diagnosis not present

## 2015-12-23 DIAGNOSIS — Z961 Presence of intraocular lens: Secondary | ICD-10-CM | POA: Diagnosis not present

## 2015-12-24 ENCOUNTER — Other Ambulatory Visit: Payer: Self-pay | Admitting: *Deleted

## 2015-12-24 ENCOUNTER — Encounter: Payer: Medicare Other | Admitting: Dietician

## 2015-12-24 ENCOUNTER — Telehealth: Payer: Self-pay | Admitting: Endocrinology

## 2015-12-24 ENCOUNTER — Encounter: Payer: Self-pay | Admitting: Dietician

## 2015-12-24 VITALS — Ht 62.0 in | Wt 142.0 lb

## 2015-12-24 DIAGNOSIS — E1065 Type 1 diabetes mellitus with hyperglycemia: Secondary | ICD-10-CM

## 2015-12-24 DIAGNOSIS — E108 Type 1 diabetes mellitus with unspecified complications: Principal | ICD-10-CM

## 2015-12-24 DIAGNOSIS — Z713 Dietary counseling and surveillance: Secondary | ICD-10-CM | POA: Diagnosis not present

## 2015-12-24 DIAGNOSIS — IMO0002 Reserved for concepts with insufficient information to code with codable children: Secondary | ICD-10-CM

## 2015-12-24 MED ORDER — INSULIN NPH (HUMAN) (ISOPHANE) 100 UNIT/ML ~~LOC~~ SUSP: SUBCUTANEOUS | Status: DC

## 2015-12-24 MED ORDER — INSULIN REGULAR HUMAN 100 UNIT/ML IJ SOLN: INTRAMUSCULAR | Status: DC

## 2015-12-24 MED ORDER — "INSULIN SYRINGE 31G X 5/16"" 0.5 ML MISC": Status: DC

## 2015-12-24 MED ORDER — ALCOHOL SWABS PADS: MEDICATED_PAD | Status: DC
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
## 2015-12-24 NOTE — Telephone Encounter (Signed)
Instructions given to patient, ketone strips called in.

## 2015-12-24 NOTE — Patient Instructions (Addendum)
Omega 3 sources- helps antidepressants work better  [employer], salmon, ground flax seeds, chia seeds, walnuts Small amounts of protein with each meal and snack (lean meat, beans, tofu, nuts, eggs, milk, yogurt) Have a small snack with protein before working out. Avoid skipping meals if possible. Practice Carbohydrate counting Put app on phone that has nutritional information for carbohydrates.  See list.

## 2015-12-24 NOTE — Telephone Encounter (Signed)
Need to call pharmacy directly about ketone strips.  Let patient know this is for blood sugars over 300 Also she needs to reduce her NPH to 10 units in the morning when exercising

## 2015-12-24 NOTE — Telephone Encounter (Signed)
Pt needs refills on the NPH and the reg novolin and the ultra fine syringes and [facility]abs [facility]  Can we order her some glucon strips for UA detection of sugar.

## 2015-12-24 NOTE — Telephone Encounter (Signed)
Please see below and advise. I can't find anything called Ketone strip in the medication lists.

## 2015-12-25 ENCOUNTER — Other Ambulatory Visit: Payer: Self-pay | Admitting: Physician Assistant

## 2015-12-28 ENCOUNTER — Encounter: Payer: Medicare Other | Admitting: Nutrition

## 2015-12-28 ENCOUNTER — Telehealth: Payer: Self-pay | Admitting: Endocrinology

## 2015-12-28 ENCOUNTER — Other Ambulatory Visit: Payer: Self-pay | Admitting: *Deleted

## 2015-12-28 DIAGNOSIS — E1065 Type 1 diabetes mellitus with hyperglycemia: Secondary | ICD-10-CM

## 2015-12-28 DIAGNOSIS — Z713 Dietary counseling and surveillance: Secondary | ICD-10-CM | POA: Diagnosis not present

## 2015-12-28 MED ORDER — "INSULIN SYRINGE 31G X 5/16"" 0.5 ML MISC": Status: DC

## 2015-12-28 NOTE — Telephone Encounter (Signed)
Patient came into the office to see Vaughan Basta and requests an Rx refill   Rx: Syringes   [facility]   Thank you

## 2015-12-28 NOTE — Telephone Encounter (Signed)
rx sent

## 2015-12-29 NOTE — Patient Instructions (Signed)
Increase Night time N insulin by 1-2u every 3 days until FBSs come down below 140. Use log book to record blood sugars and look for patterns of lows and high reading over several days.

## 2015-12-29 NOTE — Progress Notes (Signed)
Patient is here today, because she is not certain which insulin to adjust when patterns are high/low, and how much to do this.  We discussed which meals and what times each of her R and N insulins are covering, and when they work, and goals for blood sugar readings.    She reported good understanding of this.  Her meter download shows that FBSs  For last 2 weeks are 220-350, despite going to bed with blood sugars in the low to normal ranges.  She was correct in choosing which insulin works during the night to bring down her blood sugar by morning and how much to increase.  She reported that she has learned to carb count, and is calculating the meal time insulin correctly, but is not sure how to calculate the correction dose.  We reviewed this, and she was given  written directions for:  Present  Blood sugar minus 100, divided by 50.  She did 2 examples correctly, and had no further questions about this.   She was given a log book to record her readings, and she was shown how to look for patterns over several days.  She was told to always correct patterns of low blood sugars first, then move to the highest readings, and correct those next.  She reported good understanding of this, and agreed to call if questions.  We also discussed what to do when she eats high fat.  Written instructions were to take the R after the meal, and add 1-2u to the total bolus.    Patient admits to binge eating when under stress.  She admits to eating for several hours, and does not know what to do for this.  Discussed the need to count carbs for this and to take the insulin as needed for carbs eaten.  She agreed to do this.  Discussed other things to do when under stress and she sees what she is doing--ie leave the house, or go for a walk, or talk to a friend.   She had no final questions.  Is interested in a pump, but is waiting for Dr. Dwyane Dee to approve this.

## 2015-12-30 ENCOUNTER — Ambulatory Visit: Payer: Self-pay | Admitting: Endocrinology

## 2015-12-31 DIAGNOSIS — J301 Allergic rhinitis due to pollen: Secondary | ICD-10-CM | POA: Diagnosis not present

## 2016-01-03 ENCOUNTER — Ambulatory Visit (INDEPENDENT_AMBULATORY_CARE_PROVIDER_SITE_OTHER): Payer: Medicare Other | Admitting: Endocrinology

## 2016-01-03 ENCOUNTER — Other Ambulatory Visit: Payer: Self-pay | Admitting: Physician Assistant

## 2016-01-03 ENCOUNTER — Encounter: Payer: Self-pay | Admitting: Endocrinology

## 2016-01-03 VITALS — BP 110/68 | HR 92 | Temp 97.7°F | Resp 14 | Ht 64.0 in | Wt 143.6 lb

## 2016-01-03 DIAGNOSIS — E1065 Type 1 diabetes mellitus with hyperglycemia: Secondary | ICD-10-CM | POA: Diagnosis not present

## 2016-01-03 NOTE — Patient Instructions (Addendum)
Lantus 6 units twice daily  Exercise days take 4 Novolog only in am   Try 6-7 Novolog at breakfast and supper

## 2016-01-03 NOTE — Progress Notes (Signed)
Patient ID: Sheryl Matthews, female   DOB: 1966/09/17, 50 y.o.   MRN: [id number]          Reason for Appointment : Follow-up for Type 1 Diabetes  History of Present Illness          Diagnosis: Type 1 diabetes mellitus, date of diagnosis:  at age 28    Previous history:   She has been on insulin since the time of diagnosis. Also appears to be on the same program of NPH and Regular Insulin since diagnosis was made  INSULIN regimen is described as: 8 R 12 N about 30 min ac breakfast, 6 units at lunch and 8 units at supper, 4 N hs  Correction doses usually 1-2 units  Recent history:    Because of poor control with her NPH and regular regimen she was told to start Tresiba insulin along with Novolog but this was not covered Although she did start taking Lantus she started itching with this and did not continue.   She is back to taking NPH insulin but now doing this twice a day  Glucose patterns, management and problems identified:      FASTING blood sugars are usually high and are the highest of the day  She still has variable readings overnight and recently has a couple of good readings at 2 AM  She is monitoring her blood sugar almost continuously throughout the day because of fear of hypoglycemia  Blood sugars are progressively lower around midday and afternoon with the lowest readings around 4-5 PM  Blood sugars are only mildly increased in the evenings with variable increase in blood sugar after supper but on an average still not significantly high  She is taking her evening NPH at bedtime and only 4 units currently  NPH insulin: Her dose was reduced to 12 units after her visit with the dietitian, previously had been taking as much as 32 units in the morning  She has not gone back to taking NOVOLOG insulin because she thought it was making her hungry and also it did not work fast enough for high readings  She is eating better now and is taking insulin with all her meals including  lunch when she is eating She is getting hypoglycemia fairly frequently in the early afternoon up to occasionally as late as 8 PM; some of these readings are low even without exercise  Glucose monitoring:  is being done recently up to 11 times a day         Glucometer:  Freestyle .      Blood Glucose readings from meter download   Mean values apply above for all meters except median for One Touch  PRE-MEAL Fasting Lunch Dinner Bedtime overnight  Glucose range: 101-379 47-242 26-218 50-275 49-409  Mean/median: 250 130 122      Average readings overnight for the last 14 days = 158+/-85  Hypoglycemia:  Factors causing hyperglycemia: Reduced food intake, exercise Symptoms of hypoglycemia: Weakness, dizziness, difficulty thinking, feels sweaty and shaky only if blood sugar in the 30s.  May recognize symptoms low 60 at times but inconsistently.  Has had seizures during the night from hypoglycemia Treatment of hypoglycemia: juice, peanut butter, Glucagon     Diabetes labs:  Lab Results  Component Value Date   HGBA1C 7.9 11/25/2015   HGBA1C 8.4* 06/07/2015   Lab Results  Component Value Date   LDLCALC 86 07/21/2015   CREATININE 0.70 07/21/2015      Medication List  This list is accurate as of: 01/03/16  3:07 PM.  Always use your most recent med list.               Alcohol Swabs Pads  Use as instructed     ALPRAZolam 0.25 MG tablet  Commonly known as:  XANAX  Take 1 tablet (0.25 mg total) by mouth as needed for anxiety.     buPROPion 150 MG 24 hr tablet  Commonly known as:  WELLBUTRIN XL  Take 1 tablet (150 mg total) by mouth daily.     fexofenadine 180 MG tablet  Commonly known as:  ALLEGRA  Take 1 tablet (180 mg total) by mouth daily.     FLUoxetine 20 MG capsule  Commonly known as:  PROZAC  Take 3 capsules (60 mg total) by mouth daily.     fluticasone 50 MCG/ACT nasal spray  Commonly known as:  FLONASE  Place 1 spray into both nostrils daily.      gabapentin 300 MG capsule  Commonly known as:  NEURONTIN  TAKE 1 CAPSULE DAILY.     glucagon 1 MG injection  Inject 1 mg intramuscularly for severe hypoglycemia     glucose blood test strip  Commonly known as:  FREESTYLE LITE  Use as instructed to check blood sugar 6 times per day     HAIR VITAMINS PO  Take by mouth daily.     insulin aspart 100 UNIT/ML injection  Commonly known as:  NOVOLOG  Inject 10 Units into the skin 3 (three) times daily before meals.     Insulin Glargine 100 UNIT/ML Solostar Pen  Commonly known as:  LANTUS SOLOSTAR  Inject 20 Units into the skin 2 (two) times daily.     insulin NPH Human 100 UNIT/ML injection  Commonly known as:  HUMULIN N,NOVOLIN N  Inject 32 units every morning and 18 units every evening     Insulin Pen Needle 32G X 4 MM Misc  Commonly known as:  BD PEN NEEDLE NANO U/F  Use 4 per day to inject insulin     insulin regular 100 units/mL injection  Commonly known as:  NOVOLIN R,HUMULIN R  Inject 10 units every morning and 4 units every evening     INSULIN SYRINGE .5CC/31GX5/16" 31G X 5/16" 0.5 ML Misc  Use to inject insulin 4 times per day     lisinopril 2.5 MG tablet  Commonly known as:  PRINIVIL,ZESTRIL  TAKE 1 TABLET DAILY     metoCLOPramide 5 MG tablet  Commonly known as:  REGLAN  Take 1 tablet (5 mg total) by mouth 3 (three) times daily before meals.     naproxen 500 MG tablet  Commonly known as:  NAPROSYN  TAKE 1 TABLET DAILY     NEXIUM 40 MG capsule  Generic drug:  esomeprazole  TAKE 1 CAPSULE DAILY AT 12 NOON     PROBIOTIC DAILY PO  Take by mouth.     VANICREAM EX  Apply topically as needed.        Allergies:  Allergies  Allergen Reactions  . Latex Rash  . Tape Rash    Past Medical History  Diagnosis Date  . Diabetes mellitus without complication ([place])   . Depression     No past surgical history on file.  Family History  Problem Relation Age of Onset  . Depression Sister   . Alcohol abuse  Brother   . Diabetes Brother     Social History:  reports that she  has never smoked. She has never used smokeless tobacco. She reports that she does not drink alcohol or use illicit drugs.    Review of Systems   ROS      Lipids: Not on any statins  Lab Results  Component Value Date   CHOL 182 07/21/2015   HDL 83 07/21/2015   LDLCALC 86 07/21/2015   TRIG 64 07/21/2015   CHOLHDL 2.2 07/21/2015      She still has some nausea and abdominal discomfort and is awaiting GI consultation.   Has not started Reglan that was recommended last time       She has a history of Numbness 1 and, tingling in her feet for about a year.    LABS:  No visits with results within 1 Week(s) from this visit. Latest known visit with results is:  Office Visit on 11/25/2015  Component Date Value Ref Range Status  . Hemoglobin A1C 11/25/2015 7.9   Final    Physical Examination:  BP 110/68 mmHg  Pulse 92  Temp(Src) 97.7 F (36.5 C)  Resp 14  Ht 5\' 4"  (1.626 m)  Wt 143 lb 9.6 oz (65.137 kg)  BMI 24.64 kg/m2  SpO2 97%    ASSESSMENT:  Diabetes type 1, long-standing and poorly controlled She is on a regimen of NPH and regular insulin with syringe and vial She had gone off her Lantus because she was having reportedly itching with this but not clear if she was doing better Discussed with the patient that currently her NPH insulin in the morning is causing hypoglycemia in the afternoon Also is getting inadequate insulin to cover her fasting hyperglycemia and because of a couple of episodes of low sugars during the night would not like to increase her bedtime dose She is probably getting at least adequate amounts of regular insulin with her meals but this may be contributed into some hypoglycemia and the midday and early afternoon because of long duration of action  PLAN:   Start back on Lantus and stop NPH from tomorrow Given her a flowsheet to adjust the Lantus in the evening based on fasting  readings every 3 days Take 6 units of Lantus in the morning also Will try to see if her insurance needs a letter to cover the [facility] Novolog instead of Regular Insulin, may need less insulin at least at suppertime Discussed postprandial targets Reduce Novolog 50% on the morning she is planning to exercise Consider more education regarding carbohydrate counting and potentially use an insulin pump in the future   Patient Instructions  Lantus 6 units twice daily  Exercise days take 4 Novolog only in am   Try 6-7 Novolog at breakfast and supper     Counseling time on subjects discussed above is over 50% of today's 25 minute visit    Dionisio Aragones 01/03/2016, 3:07 PM   Note: This note was prepared with Dragon voice recognition system technology. Any transcriptional errors that result from this process are unintentional.

## 2016-01-05 ENCOUNTER — Telehealth: Payer: Self-pay | Admitting: Nutrition

## 2016-01-05 ENCOUNTER — Telehealth: Payer: Self-pay | Admitting: Endocrinology

## 2016-01-05 ENCOUNTER — Other Ambulatory Visit: Payer: Self-pay | Admitting: *Deleted

## 2016-01-05 MED ORDER — INSULIN ASPART 100 UNIT/ML ~~LOC~~ SOLN: 10.0000 [IU] | Freq: Three times a day (TID) | SUBCUTANEOUS | Status: DC

## 2016-01-05 MED ORDER — INSULIN GLARGINE 100 UNIT/ML SOLOSTAR PEN: 20.0000 [IU] | PEN_INJECTOR | Freq: Two times a day (BID) | SUBCUTANEOUS | Status: DC

## 2016-01-05 MED ORDER — INSULIN PEN NEEDLE 32G X 4 MM MISC: Status: DC

## 2016-01-05 NOTE — Telephone Encounter (Signed)
Told her as below.  She reverbalized the correct dose adjustment.

## 2016-01-05 NOTE — Telephone Encounter (Signed)
She should take another 2 units of Lantus now and increase morning Lantus to 8 units twice a day for now Also agree with reducing Novolog depending on carbs..  She can try to take it right after eating

## 2016-01-05 NOTE — Telephone Encounter (Signed)
Patient need a refill of Lantus and Novalog and [facility]

## 2016-01-05 NOTE — Telephone Encounter (Signed)
Phone call this AM.  Patient said that she starter her Lantus/Novolog yesterday.  Blood sugars:  AcL: 206, acS: 288 (she took a correction and then 7u for supper and ate only 30 carbs.  Blood sugar dropped to 42, and then 47 by 9PM.  FBS today was 358 (5AM).  She took 5u Novolog at that time.  Blood sugar at 9:30 was 183. She wanted to know what to do about her Lantus dose.  Discussed why she dropped low, and she reported good understanding of this, as well as the possible rebound from the low this AM.  She was told to take 1u for every 15 grams of carbs and to test at HS.  If High tomorrow, call the office.   She reported good understanding of this.

## 2016-01-05 NOTE — Telephone Encounter (Signed)
rxs sent

## 2016-01-06 DIAGNOSIS — J301 Allergic rhinitis due to pollen: Secondary | ICD-10-CM | POA: Diagnosis not present

## 2016-01-07 ENCOUNTER — Other Ambulatory Visit: Payer: Self-pay | Admitting: Physician Assistant

## 2016-01-14 DIAGNOSIS — H9392 Unspecified disorder of left ear: Secondary | ICD-10-CM | POA: Diagnosis not present

## 2016-01-14 DIAGNOSIS — Z8709 Personal history of other diseases of the respiratory system: Secondary | ICD-10-CM | POA: Diagnosis not present

## 2016-01-31 ENCOUNTER — Other Ambulatory Visit: Payer: Self-pay | Admitting: *Deleted

## 2016-01-31 ENCOUNTER — Telehealth: Payer: Self-pay | Admitting: Endocrinology

## 2016-01-31 MED ORDER — INSULIN GLARGINE 100 UNIT/ML SOLOSTAR PEN: 20.0000 [IU] | PEN_INJECTOR | Freq: Two times a day (BID) | SUBCUTANEOUS | Status: DC

## 2016-01-31 MED ORDER — GLUCOSE BLOOD VI STRP: ORAL_STRIP | Status: DC

## 2016-01-31 NOTE — Telephone Encounter (Signed)
Pt said that she needs refills on Lantus and her Test Strips sent into [facility].

## 2016-01-31 NOTE — Telephone Encounter (Signed)
[facility]

## 2016-02-05 ENCOUNTER — Other Ambulatory Visit (HOSPITAL_COMMUNITY): Payer: Self-pay | Admitting: Psychiatry

## 2016-02-08 ENCOUNTER — Ambulatory Visit (INDEPENDENT_AMBULATORY_CARE_PROVIDER_SITE_OTHER): Payer: Medicare Other | Admitting: Psychiatry

## 2016-02-08 ENCOUNTER — Encounter (HOSPITAL_COMMUNITY): Payer: Self-pay | Admitting: Psychiatry

## 2016-02-08 VITALS — BP 128/68 | HR 87 | Ht 64.0 in | Wt 137.2 lb

## 2016-02-08 DIAGNOSIS — F3130 Bipolar disorder, current episode depressed, mild or moderate severity, unspecified: Secondary | ICD-10-CM

## 2016-02-08 DIAGNOSIS — F411 Generalized anxiety disorder: Secondary | ICD-10-CM | POA: Diagnosis not present

## 2016-02-08 DIAGNOSIS — G4700 Insomnia, unspecified: Secondary | ICD-10-CM | POA: Diagnosis not present

## 2016-02-08 MED ORDER — BUPROPION HCL ER (XL) 150 MG PO TB24: 150.0000 mg | ORAL_TABLET | Freq: Every day | ORAL | Status: DC

## 2016-02-08 MED ORDER — FLUOXETINE HCL 20 MG PO CAPS: 60.0000 mg | ORAL_CAPSULE | Freq: Every day | ORAL | Status: DC

## 2016-02-08 MED ORDER — ALPRAZOLAM 0.25 MG PO TABS: 0.2500 mg | ORAL_TABLET | ORAL | Status: DC | PRN

## 2016-02-08 NOTE — Progress Notes (Signed)
Patient ID: Sheryl Matthews, female   DOB: 27-Jul-1966, 50 y.o.   MRN: [id number]  [facility] [id number] Progress Note  Shawney Kwiat [id number] 50 y.o.  02/08/2016 3:15 PM  Chief Complaint:  Bipolar disorder MRE depressed History of Present Illness:  Patient has been diagnosed with bipolar, returns for follow up.  she is on Wellbutrin Prozac.  Bipolar; last visit she was okay with the Topamax but apparently she called and wanted to change the gabapentin. Now she is taking Neurontin 300 mg that is keeping her balanced ,  In the past she has had manic episodes when she would do in discrete sex and sees excessive energy and decreased need for sleep in the beginning is difficult for her husband to understand what she is going through Insomnia: variable but recently worse since her sister and her husband has moved in. Her sister has possible bipolar according to her she is up at night and is very perky or moody Aggravating factors; stress and poor sleep Modifying factors; supportive husband and going to gym Severity of depression; 6 out of 10. 10 being no depression Context; stress poor sleep Duration; more than 10 years No history of psychotic symptoms currently. Denies drug use or ABUSE of alcohol as of now   Suicidal Ideation: No Plan Formed: No Patient has means to carry out plan: No  Homicidal Ideation: No Plan Formed: No Patient has means to carry out plan: No  Neurologic: Headache: No Seizure: No Paresthesias: No  Past Medical Family, Social History: Husband is finishing his deployment . She is glad that he is home  Outpatient Encounter Prescriptions as of 02/08/2016  Medication Sig  . Alcohol Swabs PADS Use as instructed  . ALPRAZolam (XANAX) 0.25 MG tablet Take 1 tablet (0.25 mg total) by mouth as needed for anxiety.  Marland Kitchen buPROPion (WELLBUTRIN XL) 150 MG 24 hr tablet Take 1 tablet (150 mg total) by mouth daily.  . Emollient (VANICREAM EX) Apply topically as needed.  .  fexofenadine (ALLEGRA) 180 MG tablet Take 1 tablet (180 mg total) by mouth daily.  Marland Kitchen FLUoxetine (PROZAC) 20 MG capsule Take 3 capsules (60 mg total) by mouth daily.  . fluticasone (FLONASE) 50 MCG/ACT nasal spray Place 1 spray into both nostrils daily.  Marland Kitchen gabapentin (NEURONTIN) 300 MG capsule TAKE 1 CAPSULE DAILY.  Marland Kitchen glucagon 1 MG injection Inject 1 mg intramuscularly for severe hypoglycemia  . glucose blood (FREESTYLE LITE) test strip Use as instructed to check blood sugar 6 times per day Dx code E10.65  . insulin aspart (NOVOLOG) 100 UNIT/ML injection Inject 10 Units into the skin 3 (three) times daily before meals.  . Insulin Glargine (LANTUS SOLOSTAR) 100 UNIT/ML Solostar Pen Inject 20 Units into the skin 2 (two) times daily.  . insulin NPH Human (HUMULIN N,NOVOLIN N) 100 UNIT/ML injection Inject 32 units every morning and 18 units every evening (Patient taking differently: Inject 14 units every morning and 4 units every evening)  . Insulin Pen Needle (BD PEN NEEDLE NANO U/F) 32G X 4 MM MISC Use 4 per day to inject insulin  . insulin regular (NOVOLIN R,HUMULIN R) 100 units/mL injection Inject 10 units every morning and 4 units every evening (Patient taking differently: Inject 5 units if working out, 5 units at lunchand 4 units every evening)  . Insulin Syringe-Needle U-100 (INSULIN SYRINGE .5CC/31GX5/16") 31G X 5/16" 0.5 ML MISC Use to inject insulin 4 times per day  . lisinopril (PRINIVIL,ZESTRIL) 2.5 MG tablet TAKE 1 TABLET DAILY  .  metoCLOPramide (REGLAN) 5 MG tablet Take 1 tablet (5 mg total) by mouth 3 (three) times daily before meals.  . Multiple Vitamins-Minerals (HAIR VITAMINS PO) Take by mouth daily.  . naproxen (NAPROSYN) 500 MG tablet TAKE 1 TABLET DAILY  . NEXIUM 40 MG capsule TAKE 1 CAPSULE DAILY AT 12 NOON  . Probiotic Product (PROBIOTIC DAILY PO) Take by mouth.  . [DISCONTINUED] ALPRAZolam (XANAX) 0.25 MG tablet Take 1 tablet (0.25 mg total) by mouth as needed for anxiety.  .  [DISCONTINUED] buPROPion (WELLBUTRIN XL) 150 MG 24 hr tablet Take 1 tablet (150 mg total) by mouth daily.  . [DISCONTINUED] FLUoxetine (PROZAC) 20 MG capsule Take 3 capsules (60 mg total) by mouth daily.   No facility-administered encounter medications on file as of 02/08/2016.    Past Psychiatric History/Hospitalization(s): Anxiety: yes Bipolar Disorder: Yes Depression: yes Mania: No Psychosis: No Schizophrenia: No Personality Disorder: No Hospitalization for psychiatric illness: Yes History of Electroconvulsive Shock Therapy: No Prior Suicide Attempts: Yes  6 months ago OD'd no hospitalization  Physical Exam: Constitutional:  BP 128/68 mmHg  Pulse 87  Ht 5\' 4"  (1.626 m)  Wt 137 lb 3.2 oz (62.234 kg)  BMI 23.54 kg/m2  SpO2 97%  General Appearance: alert, oriented, no acute distress ROS: no nausea, vomiting. No chest pain. No rash  Musculoskeletal: Strength & Muscle Tone: within normal limits Gait & Station: normal Patient leans: N/A  Psychiatric: Speech (describe rate, volume, coherence, spontaneity, and abnormalities if any): clear and goal directed, normal rate volume and rhythm  Thought Process (describe rate, content, abstract reasoning, and computation): normal  Associations: Coherent and Relevant  Thoughts: normal  Mental Status: Orientation: oriented to person, place and time/date Mood & Affect: dysphoric with constricted affect Attention Span & Concentration: good No psychotic symptoms. Alert and oriented with reasonable judjement.     Assessment: Axis I: Bipolar MRE depressed, GAD. insomnia  Plan:  Bipolar:  Continue gabapentin 300mg  qd. Gets from primary care Depression: Continue Wellbutrin and Prozac not to increase the dose as of now. 90 day supply sent Generalized anxiety disorder;  Continue prozac and coping skills,.xanax prn. Printed out this time small dose 0.25mg  qd prn. Insomnia: reviewed sleep hygiene. Use coping skills and or xanax  prn. She  Talked about her sister who may need psych help . More than 50% of time spent in counseling and coordination of care including patient education and review/expectation from medications.  Call 911 and report to local emergency room for any urgent concerns of suicidal thoughts Follow-up's primary care physician regarding to her lab work. Patient is a non-nicotine user Follow up in 8  weeks.  Time spent: 25 minutes Merian Capron, MD 02/08/2016

## 2016-02-09 ENCOUNTER — Other Ambulatory Visit: Payer: Self-pay

## 2016-02-14 ENCOUNTER — Ambulatory Visit: Payer: Self-pay | Admitting: Endocrinology

## 2016-02-18 ENCOUNTER — Telehealth: Payer: Self-pay | Admitting: Endocrinology

## 2016-02-18 ENCOUNTER — Other Ambulatory Visit: Payer: Self-pay | Admitting: *Deleted

## 2016-02-18 MED ORDER — INSULIN PEN NEEDLE 32G X 4 MM MISC: Status: DC

## 2016-02-18 NOTE — Telephone Encounter (Signed)
rx sent to [facility] for a 90 day supply

## 2016-02-18 NOTE — Telephone Encounter (Signed)
She uses 5 injections a day.  Also needs to reschedule her canceled appointment

## 2016-02-18 NOTE — Telephone Encounter (Signed)
Patient states she will need a Rx called in for her pen needles. Please send a box of 90 day supply to local pharmacy Sheryl Matthews states she uses 50 a week   [facility]   Thank you

## 2016-02-18 NOTE — Telephone Encounter (Signed)
Please see below, she only does 4 injections per day with a pen. Please advise on how many to call in.

## 2016-02-21 ENCOUNTER — Other Ambulatory Visit: Payer: Self-pay | Admitting: Physician Assistant

## 2016-02-21 ENCOUNTER — Telehealth: Payer: Self-pay | Admitting: Endocrinology

## 2016-02-21 DIAGNOSIS — Z1231 Encounter for screening mammogram for malignant neoplasm of breast: Secondary | ICD-10-CM

## 2016-02-21 NOTE — Telephone Encounter (Signed)
Please call pt back about pen needles

## 2016-02-21 NOTE — Telephone Encounter (Signed)
I spoke with her on Friday, she only uses 5 per day, rx was sent in to her local drug store, she is aware.

## 2016-02-25 ENCOUNTER — Other Ambulatory Visit: Payer: Self-pay | Admitting: *Deleted

## 2016-02-25 ENCOUNTER — Telehealth: Payer: Self-pay | Admitting: Endocrinology

## 2016-02-25 MED ORDER — INSULIN PEN NEEDLE 32G X 4 MM MISC: Status: DC

## 2016-02-25 NOTE — Telephone Encounter (Signed)
She takes more insulin than prescribed if her sugars are off, Please see below and advise. Pharmacist said she could buy them.

## 2016-02-25 NOTE — Telephone Encounter (Signed)
Can change the prescription accordingly

## 2016-02-25 NOTE — Telephone Encounter (Signed)
Noted, rx sent.

## 2016-02-25 NOTE — Telephone Encounter (Signed)
Pt is using more than 5 needles a day if she has BS levels that are off so she is asking for an additional [facility]

## 2016-02-29 NOTE — Telephone Encounter (Signed)
Pt apologizes she found the pen needles while she was going thru her closet.

## 2016-03-01 ENCOUNTER — Ambulatory Visit: Payer: Self-pay

## 2016-03-09 ENCOUNTER — Other Ambulatory Visit: Payer: Self-pay

## 2016-03-16 ENCOUNTER — Ambulatory Visit: Payer: Self-pay | Admitting: Endocrinology

## 2016-03-25 ENCOUNTER — Other Ambulatory Visit: Payer: Self-pay | Admitting: Physician Assistant

## 2016-03-31 ENCOUNTER — Other Ambulatory Visit: Payer: Self-pay | Admitting: Physician Assistant

## 2016-04-04 ENCOUNTER — Other Ambulatory Visit: Payer: Self-pay

## 2016-04-06 ENCOUNTER — Ambulatory Visit (HOSPITAL_COMMUNITY): Payer: Medicare Other | Admitting: Psychiatry

## 2016-04-11 ENCOUNTER — Telehealth: Payer: Self-pay | Admitting: Endocrinology

## 2016-04-11 NOTE — Telephone Encounter (Signed)
[facility] 04/10/16  5:49 pm to cancel her appointment.  cancelled

## 2016-04-13 ENCOUNTER — Ambulatory Visit: Payer: Self-pay | Admitting: Endocrinology

## 2016-04-19 ENCOUNTER — Encounter: Payer: Self-pay | Admitting: Physician Assistant

## 2016-04-19 ENCOUNTER — Ambulatory Visit: Payer: Self-pay | Admitting: Physician Assistant

## 2016-04-19 ENCOUNTER — Ambulatory Visit (INDEPENDENT_AMBULATORY_CARE_PROVIDER_SITE_OTHER): Payer: Medicare Other | Admitting: Physician Assistant

## 2016-04-19 ENCOUNTER — Telehealth: Payer: Self-pay | Admitting: Nutrition

## 2016-04-19 VITALS — BP 118/61 | HR 86 | Ht 64.0 in | Wt 136.0 lb

## 2016-04-19 DIAGNOSIS — M79641 Pain in right hand: Secondary | ICD-10-CM

## 2016-04-19 DIAGNOSIS — M7989 Other specified soft tissue disorders: Secondary | ICD-10-CM | POA: Diagnosis not present

## 2016-04-19 DIAGNOSIS — E1340 Other specified diabetes mellitus with diabetic neuropathy, unspecified: Secondary | ICD-10-CM | POA: Diagnosis not present

## 2016-04-19 DIAGNOSIS — M79642 Pain in left hand: Secondary | ICD-10-CM

## 2016-04-19 NOTE — Telephone Encounter (Signed)
Patient stated that she had a form for the dexacom and lost it she need the phone number, she ask you to call her.

## 2016-04-19 NOTE — Patient Instructions (Signed)
Increase gabapentin to 300 mg twice a day.

## 2016-04-21 ENCOUNTER — Encounter: Payer: Self-pay | Admitting: Physician Assistant

## 2016-04-21 NOTE — Progress Notes (Signed)
   Subjective:    Patient ID: Sheryl Matthews, female    DOB: Feb 20, 1966, 50 y.o.   MRN: [id number]  HPI Pt presents to the clinic for referral. She is having bilateral hand numbness, tingling, swelling for over a year. She has type II diabetes and on gabapentin once a day. She tried lyrica and did not like how made her feel and she did not respond to it. Symptoms are all throughout the day. She does not notice being worse at night. She has been seen by rheumatology before(dr. Dossie Der) would like to go to another rheumatologist.    Review of Systems  All other systems reviewed and are negative.      Objective:   Physical Exam  Constitutional: She is oriented to person, place, and time. She appears well-developed and well-nourished.  HENT:  Head: Normocephalic and atraumatic.  Cardiovascular: Normal rate, regular rhythm and normal heart sounds.   Pulmonary/Chest: Effort normal and breath sounds normal.  Musculoskeletal:  No tenderness to palpation over hands.  No swelling noted today.  Hand grip 5/5.  Negative tinels and phalens.   Neurological: She is alert and oriented to person, place, and time.  Psychiatric: She has a normal mood and affect. Her behavior is normal.          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 Dossie Der before. Increased gabapentin to 300mg  bid. Will make referral for 2nd opinion. Pt has tried lyrica and was not able to tolerate. Certainly to consider some carpel tunnel causing symptoms. After referral consider nerve conduction studies.

## 2016-04-25 NOTE — Telephone Encounter (Signed)
I spoke with her and gave her the Dexcom 800 number, and well as the drug rep.'s number, Annye English.

## 2016-05-08 ENCOUNTER — Other Ambulatory Visit (HOSPITAL_COMMUNITY): Payer: Self-pay | Admitting: Psychiatry

## 2016-05-11 NOTE — Telephone Encounter (Signed)
Receive medication request from [facility]  for Wellbutrin and Prozac.  Per Dr. De Nurse, medication request is denied. Pt will need to schedule appt. Lvm for pt to return call to office.

## 2016-05-12 ENCOUNTER — Other Ambulatory Visit: Payer: Self-pay

## 2016-05-16 DIAGNOSIS — M720 Palmar fascial fibromatosis [Dupuytren]: Secondary | ICD-10-CM | POA: Diagnosis not present

## 2016-05-16 DIAGNOSIS — E119 Type 2 diabetes mellitus without complications: Secondary | ICD-10-CM | POA: Diagnosis not present

## 2016-05-17 ENCOUNTER — Ambulatory Visit: Payer: Self-pay | Admitting: Endocrinology

## 2016-05-18 ENCOUNTER — Other Ambulatory Visit: Payer: Self-pay

## 2016-05-23 ENCOUNTER — Ambulatory Visit: Payer: Self-pay | Admitting: Endocrinology

## 2016-06-05 ENCOUNTER — Other Ambulatory Visit: Payer: Self-pay

## 2016-06-06 ENCOUNTER — Other Ambulatory Visit: Payer: Self-pay | Admitting: Endocrinology

## 2016-06-08 ENCOUNTER — Ambulatory Visit: Payer: Self-pay | Admitting: Endocrinology

## 2016-06-14 ENCOUNTER — Encounter: Payer: Self-pay | Admitting: Physician Assistant

## 2016-06-14 ENCOUNTER — Ambulatory Visit (INDEPENDENT_AMBULATORY_CARE_PROVIDER_SITE_OTHER): Payer: Medicare Other | Admitting: Physician Assistant

## 2016-06-14 VITALS — BP 112/53 | HR 90 | Ht 64.0 in | Wt 131.0 lb

## 2016-06-14 DIAGNOSIS — Z87898 Personal history of other specified conditions: Secondary | ICD-10-CM | POA: Diagnosis not present

## 2016-06-14 DIAGNOSIS — N644 Mastodynia: Secondary | ICD-10-CM | POA: Diagnosis not present

## 2016-06-14 NOTE — Addendum Note (Signed)
Addended by: Donella Stade on: 06/14/2016 05:03 PM   Modules accepted: Orders, Level of Service

## 2016-06-14 NOTE — Progress Notes (Addendum)
   Subjective:    Patient ID: Sheryl Matthews, female    DOB: Oct 10, 1966, 50 y.o.   MRN: [id number]  HPI Pt is a 50 yo female that has a PMH of fibrocystic breast disease that presents with bilateral breast pain. She states the pain started approximately 7 months ago and has progressively gotten worse and more constant. She states that the pain is greater in the left than right breast. She states the pain is generalized and made worse with wearing a tight, wired bra. She states nothing helps the pain. She was last seen by her GYN in Dec 2016 but did not have the pain at that time. She has not gone in for imaging yet because is concerned about the pain of having a mammogram. She denies nipple discharge, retracted nipples, rash, warmth or erythema of the breasts.    Review of Systems Negative unless otherwise noted in the HPI    Objective:   Physical Exam  Constitutional: She appears well-developed and well-nourished.  Cardiovascular: Normal rate, regular rhythm and normal heart sounds.   Pulmonary/Chest: Effort normal and breath sounds normal.  Genitourinary: There is breast tenderness. No breast discharge.  Bilateral breasts: No erythema, rash, or ulcerations noted. Generalized tenderness to the bilateral breast upon palpation. No masses felt on palpation.        Assessment & Plan:  Bilateral breast pain/hx of fibrocystic breast-  MM digital screening ordered at the [facility]. Continue wearing a soft bra and using NSAIDs as needed for symptomatic treatment. Decrease caffiene intake.

## 2016-06-14 NOTE — Patient Instructions (Signed)
Fibrocystic Breast Changes °Fibrocystic breast changes occur when breast ducts become blocked, causing painful, fluid-filled lumps (cysts) to form in the breast. This is a common condition that is noncancerous (benign). It occurs when women go through hormonal changes during their menstrual cycle. Fibrocystic breast changes can affect one or both breasts. °CAUSES  °The exact cause of fibrocystic breast changes is not known, but it may be related to the female hormones estrogen and progesterone. Family traits that get passed from parent to child (genetics) may also be a factor in some cases. °SIGNS AND SYMPTOMS  °· Tenderness, mild discomfort, or pain.   °· Swelling.   °· Rope-like feeling when touching the breast.   °· Lumpy breast, one or both sides.   °· Changes in breast size, especially before (larger) and after (smaller) the menstrual period.   °· Green or dark brown nipple discharge (not blood).   °Symptoms are usually worse before menstrual periods start and get better toward the end of the menstrual period.  °DIAGNOSIS  °To make a diagnosis, your health care provider will ask you questions and perform a physical exam of your breasts. The health care provider may recommend other tests that can examine inside your breasts, such as: °· A breast X-ray (mammogram).   °· Ultrasonography.  °· An MRI.   °If something more than fibrocystic breast changes is suspected, your health care provider may take a breast tissue sample (breast biopsy) to examine. °TREATMENT  °Often, treatment is not needed. Your health care provider may recommend over-the-counter pain relievers to help lessen pain or discomfort caused by the fibrocystic breast changes. You may also be asked to change your diet to limit or stop eating foods or drinking beverages that contain caffeine. Foods and beverages that contain caffeine include chocolate, soda, coffee, and tea. Reducing sugar and fat in your diet may also help. Your health care provider  may also recommend: °· Fine needle aspiration to remove fluid from a cyst that is causing pain.   °· Surgery to remove a large, persistent, and tender cyst. °HOME CARE INSTRUCTIONS  °· Examine your breasts after every menstrual period. If you do not have menstrual periods, check your breasts the first day of every month. Feel for changes, such as more tenderness, a new growth, a change in breast size, or a change in a lump that has always been there.   °· Only take over-the-counter or prescription medicine as directed by your health care provider.   °· Wear a well-fitted support or sports bra, especially when exercising.   °· Decrease or avoid caffeine, fat, and sugar in your diet as directed by your health care provider.   °SEEK MEDICAL CARE IF:  °· You have fluid leaking (discharge) from your nipples, especially bloody discharge.   °· You have new lumps or bumps in the breast.   °· Your breast or breasts become enlarged, red, and painful.   °· You have areas of your breast that pucker in.   °· Your nipples appear flat or indented.   °  °This information is not intended to replace advice given to you by your health care provider. Make sure you discuss any questions you have with your health care provider. °  °Document Released: 08/30/2006 Document Revised: 08/04/2015 Document Reviewed: 05/04/2013 °Elsevier Interactive Patient Education ©2016 [employer] ° ° °

## 2016-06-16 ENCOUNTER — Other Ambulatory Visit (INDEPENDENT_AMBULATORY_CARE_PROVIDER_SITE_OTHER): Payer: Medicare Other

## 2016-06-16 DIAGNOSIS — E1065 Type 1 diabetes mellitus with hyperglycemia: Secondary | ICD-10-CM

## 2016-06-16 LAB — BASIC METABOLIC PANEL
BUN: 20 mg/dL (ref 6–23)
CALCIUM: 9 mg/dL (ref 8.4–10.5)
CO2: 29 mEq/L (ref 19–32)
CREATININE: 0.75 mg/dL (ref 0.40–1.20)
Chloride: 101 mEq/L (ref 96–112)
GFR: 86.4 mL/min (ref >60.00)
GLUCOSE: 223 mg/dL — AB (ref 70–99)
Potassium: 3.8 mEq/L (ref 3.5–5.1)
SODIUM: 135 meq/L (ref 135–145)

## 2016-06-16 LAB — HEMOGLOBIN A1C: HEMOGLOBIN A1C: 8.1 % — AB (ref 4.6–6.5)

## 2016-06-19 ENCOUNTER — Ambulatory Visit: Payer: Self-pay | Admitting: Endocrinology

## 2016-06-26 ENCOUNTER — Other Ambulatory Visit: Payer: Self-pay | Admitting: Physician Assistant

## 2016-07-05 ENCOUNTER — Other Ambulatory Visit: Payer: Self-pay | Admitting: Physician Assistant

## 2016-07-05 DIAGNOSIS — N644 Mastodynia: Secondary | ICD-10-CM

## 2016-07-05 DIAGNOSIS — Z87898 Personal history of other specified conditions: Secondary | ICD-10-CM

## 2016-07-06 ENCOUNTER — Ambulatory Visit (HOSPITAL_COMMUNITY): Payer: Medicare Other | Admitting: Psychiatry

## 2016-07-10 ENCOUNTER — Other Ambulatory Visit: Payer: Self-pay

## 2016-07-11 ENCOUNTER — Other Ambulatory Visit: Payer: Self-pay | Admitting: Physician Assistant

## 2016-07-11 ENCOUNTER — Ambulatory Visit (INDEPENDENT_AMBULATORY_CARE_PROVIDER_SITE_OTHER): Payer: Medicare Other | Admitting: Endocrinology

## 2016-07-11 ENCOUNTER — Other Ambulatory Visit: Payer: Self-pay | Admitting: Endocrinology

## 2016-07-11 VITALS — BP 108/60 | HR 84 | Ht 64.0 in | Wt 136.0 lb

## 2016-07-11 DIAGNOSIS — E1065 Type 1 diabetes mellitus with hyperglycemia: Secondary | ICD-10-CM

## 2016-07-11 NOTE — Progress Notes (Signed)
Patient ID: Sheryl Matthews, female   DOB: [birth year], 50 y.o.   MRN: [id number]          Reason for Appointment : Follow-up for Type 1 Diabetes  History of Present Illness          Diagnosis: Type 1 diabetes mellitus, date of diagnosis:  at age 39    Previous history:   She has been on insulin since the time of diagnosis. Also appears to be on the same program of NPH and Regular Insulin since diagnosis was made   Recent history:   INSULIN regimen is described as:  Lantus 8 U bid, Novolog 6-7 ac. 1:50 correction.   Because of poor control with her NPH and regular regimen she was started on Lantus insulin on her last visit about 6 months ago However she did not follow-up  Glucose patterns, management and problems identified:      FASTING blood sugars are generally better than before with only some high readings  She has only one low reading fasting this morning possibly from taking her mealtime and correction insulin late last night  She is taking a fixed dose of NovoLog at meal time with a correction dose of 1:50 and target 100  Although she was advised by the nurse educator to try using carbohydrate counting she is not comfortable doing this now  Her blood sugars are quite labile throughout the day  She thinks that a lot of her high readings may be related to not taking her mealtime insulin on time such as last evening  Blood sugars are usually not being checked after meals especially after supper  Blood sugars are averaging about 180 before meals hair usually high and are the highest of the day  She still has tendency to significant hypoglycemia with glucose readings as low as 33  Some of the HYPOGLYCEMIA appears to be preceded by high readings indicating overcorrection  She does not have any problem taking the Novolog now, previously was afraid that it would cause increased hunger and weight gain  She also takes the NovoLog when her blood sugars are normal  pre-meals  Because of personal issues and stress she is saying that she is not very compliant with her day-to-day care  She does not exercise, she thinks that previously this was helping glucose control HYPOGLYCEMIA: She was having more episodes about a week or 2 ago randomly midday and afternoon and sometimes in the evenings but rarely after supper  Glucose monitoring:  is being done recently up to 11 times a day         Glucometer:  Freestyle .      Blood Glucose readings from meter download   Mean values apply above for all meters except median for One Touch  PRE-MEAL Fasting Midday  Dinner Bedtime Overall  Glucose range: 55-300  33-413  50-369  54-268  33-413  Mean/median: 186     179    Hypoglycemia:  Factors causing hyperglycemia: Reduced food intake, exercise Symptoms of hypoglycemia: Weakness, dizziness, difficulty thinking, feels sweaty and shaky only if blood sugar in the 30s.  May recognize symptoms low 60 at times but inconsistently.  Has had seizures during the night from hypoglycemia Treatment of hypoglycemia: juice, peanut butter, Glucagon     Diabetes labs:  Lab Results  Component Value Date   HGBA1C 8.1 (H) 06/16/2016   HGBA1C 7.9 11/25/2015   HGBA1C 8.4 (H) 06/07/2015   Lab Results  Component Value Date  Gann 86 07/21/2015   CREATININE 0.75 06/16/2016      Medication List       Accurate as of 07/11/16  5:09 PM. Always use your most recent med list.          Alcohol Swabs Pads Use as instructed   ALPRAZolam 0.25 MG tablet Commonly known as:  XANAX Take 1 tablet (0.25 mg total) by mouth as needed for anxiety.   buPROPion 150 MG 24 hr tablet Commonly known as:  WELLBUTRIN XL Take 1 tablet (150 mg total) by mouth daily.   fexofenadine 180 MG tablet Commonly known as:  ALLEGRA Take 1 tablet (180 mg total) by mouth daily.   FLUoxetine 20 MG capsule Commonly known as:  PROZAC Take 3 capsules (60 mg total) by mouth daily.   fluticasone 50  MCG/ACT nasal spray Commonly known as:  FLONASE Place 1 spray into both nostrils daily.   gabapentin 300 MG capsule Commonly known as:  NEURONTIN TAKE 1 CAPSULE DAILY.   glucagon 1 MG injection Inject 1 mg intramuscularly for severe hypoglycemia   glucose blood test strip Commonly known as:  FREESTYLE LITE Use as instructed to check blood sugar 6 times per day Dx code E10.65   HAIR VITAMINS PO Take by mouth daily.   insulin aspart 100 UNIT/ML injection Commonly known as:  NOVOLOG Inject 10 Units into the skin 3 (three) times daily before meals.   Insulin Pen Needle 32G X 4 MM Misc Commonly known as:  BD PEN NEEDLE NANO U/F Use 7 per day to inject insulin   INSULIN SYRINGE .5CC/31GX5/16" 31G X 5/16" 0.5 ML Misc Use to inject insulin 4 times per day   KETOSTIX strip Generic drug:  acetone (urine) test USE TO CHECK URINE KETONES IF BLOOD SUGAR IS OVER 300 AS DIRECTED BY PHYSICIAN   LANTUS SOLOSTAR 100 UNIT/ML Solostar Pen Generic drug:  Insulin Glargine INJECT 20 UNITS UNDER THE SKIN TWICE A DAY   naproxen 500 MG tablet Commonly known as:  NAPROSYN TAKE 1 TABLET DAILY   NEXIUM 40 MG capsule Generic drug:  esomeprazole TAKE 1 CAPSULE DAILY AT 12 NOON   PROBIOTIC DAILY PO Take by mouth.   VANICREAM EX Apply topically as needed.       Allergies:  Allergies  Allergen Reactions  . Latex Rash  . Tape Rash  . Lyrica [Pregabalin]     Did not have benefit.     Past Medical History:  Diagnosis Date  . Depression   . Diabetes mellitus without complication ([place])     No past surgical history on file.  Family History  Problem Relation Age of Onset  . Depression Sister   . Alcohol abuse Brother   . Diabetes Brother     Social History:  reports that she has never smoked. She has never used smokeless tobacco. She reports that she does not drink alcohol or use drugs.    Review of Systems   ROS      Lipids: Not on any statins  Lab Results  Component  Value Date   CHOL 182 07/21/2015   HDL 83 07/21/2015   LDLCALC 86 07/21/2015   TRIG 64 07/21/2015   CHOLHDL 2.2 07/21/2015      She still has some nausea and abdominal discomfort and is awaiting GI consultation.   Has not started Reglan that was recommended last time       She has a history of Numbness 1 and, tingling in her feet for  about a year.    LABS:  No visits with results within 1 Week(s) from this visit.  Latest known visit with results is:  Lab on 06/16/2016  Component Date Value Ref Range Status  . Hgb A1c MFr Bld 06/16/2016 8.1* 4.6 - 6.5 % Final  . Sodium 06/16/2016 135  135 - 145 mEq/L Final  . Potassium 06/16/2016 3.8  3.5 - 5.1 mEq/L Final  . Chloride 06/16/2016 101  96 - 112 mEq/L Final  . CO2 06/16/2016 29  19 - 32 mEq/L Final  . Glucose, Bld 06/16/2016 223* 70 - 99 mg/dL Final  . BUN 06/16/2016 20  6 - 23 mg/dL Final  . Creatinine, Ser 06/16/2016 0.75  0.40 - 1.20 mg/dL Final  . Calcium 06/16/2016 9.0  8.4 - 10.5 mg/dL Final  . GFR 06/16/2016 86.40  >60.00 mL/min Final    Physical Examination:  BP 108/60   Pulse 84   Ht 5\' 4"  (1.626 m)   Wt 136 lb (61.7 kg)   SpO2 98%   BMI 23.34 kg/m     ASSESSMENT:  Diabetes type 1, long-standing and poorly controlled See history of present illness for detailed discussion of current diabetes management, blood sugar patterns and problems identified  Overall she is doing better with Lantus and NovoLog compared to NPH and regular previously Blood sugars are high overall but with significant variability at all times Not clear why she gets hyperglycemic at times, occasionally this is from rebound after hypoglycemia She thinks that she is not able to be compliant with her NovoLog at mealtimes consistently for various reasons  However appears to have excessive overcorrection of high readings at times with current 1:15 ratio She is still not comfortable starting carbohydrate counting   PLAN:   Since fasting  blood sugars are recently not consistently high will not increase her evening Lantus Again blood sugars before suppertime are recently fairly good overall She will need to try 1:60 correction instead of 1:55 readings Avoid extra insulin at bedtime Given information on [facility], this may provide more consistent control that Lantus cannot and will need prior authorization, she will call when she is finishing her supply of Lantus She is apparently going to get the [facility] and this may benefit She is still reluctant to start an insulin pump Recommended she discuss day-to-day management with nurse educator again   Patient Instructions  For high sugars correct 1 unit per 60mg  over 100  More consistent Novolog when eating  2 Gabapentin at bedtime   Counseling time on subjects discussed above is over 50% of today's 25 minute visit    Sheryl Matthews 07/11/2016, 5:09 PM   Note: This note was prepared with Dragon voice recognition system technology. Any transcriptional errors that result from this process are unintentional.

## 2016-07-11 NOTE — Patient Instructions (Addendum)
For high sugars correct 1 unit per 60mg  over 100  More consistent Novolog when eating  2 Gabapentin at bedtime

## 2016-07-12 ENCOUNTER — Ambulatory Visit
Admission: RE | Admit: 2016-07-12 | Discharge: 2016-07-12 | Disposition: A | Discharge status: Home / Self Care | Payer: TRICARE For Life (TFL) | Source: Ambulatory Visit | Attending: Physician Assistant | Admitting: Physician Assistant

## 2016-07-12 ENCOUNTER — Other Ambulatory Visit: Payer: Self-pay

## 2016-07-12 ENCOUNTER — Encounter: Payer: Self-pay | Admitting: Physician Assistant

## 2016-07-12 DIAGNOSIS — R922 Inconclusive mammogram: Secondary | ICD-10-CM | POA: Diagnosis not present

## 2016-07-12 DIAGNOSIS — N63 Unspecified lump in breast: Secondary | ICD-10-CM | POA: Diagnosis not present

## 2016-07-12 DIAGNOSIS — N6320 Unspecified lump in the left breast, unspecified quadrant: Secondary | ICD-10-CM | POA: Insufficient documentation

## 2016-07-12 DIAGNOSIS — N6489 Other specified disorders of breast: Secondary | ICD-10-CM | POA: Diagnosis not present

## 2016-07-29 ENCOUNTER — Other Ambulatory Visit: Payer: Self-pay | Admitting: Endocrinology

## 2016-08-04 ENCOUNTER — Other Ambulatory Visit: Payer: Self-pay | Admitting: Physician Assistant

## 2016-08-15 DIAGNOSIS — Z23 Encounter for immunization: Secondary | ICD-10-CM | POA: Diagnosis not present

## 2016-09-06 ENCOUNTER — Telehealth: Payer: Self-pay | Admitting: Physician Assistant

## 2016-09-06 DIAGNOSIS — G629 Polyneuropathy, unspecified: Secondary | ICD-10-CM

## 2016-09-06 DIAGNOSIS — M79641 Pain in right hand: Secondary | ICD-10-CM

## 2016-09-06 DIAGNOSIS — M79642 Pain in left hand: Secondary | ICD-10-CM

## 2016-09-06 NOTE — Telephone Encounter (Signed)
Sheryl Matthews, Pt called. She wants a referral for an MRI due to dense breast tissue & also a referral back to rheumatology.  Thanks!

## 2016-09-07 NOTE — Telephone Encounter (Signed)
LMOM for pt to return call. 

## 2016-09-07 NOTE — Telephone Encounter (Signed)
Referrals placed per pt request.

## 2016-10-03 ENCOUNTER — Ambulatory Visit (HOSPITAL_COMMUNITY): Payer: Self-pay | Admitting: Psychiatry

## 2016-10-06 ENCOUNTER — Other Ambulatory Visit (INDEPENDENT_AMBULATORY_CARE_PROVIDER_SITE_OTHER): Payer: Medicare Other

## 2016-10-06 DIAGNOSIS — E1065 Type 1 diabetes mellitus with hyperglycemia: Secondary | ICD-10-CM | POA: Diagnosis not present

## 2016-10-06 LAB — COMPREHENSIVE METABOLIC PANEL
ALK PHOS: 69 U/L (ref 39–117)
ALT: 15 U/L (ref 0–35)
AST: 17 U/L (ref 0–37)
Albumin: 3.9 g/dL (ref 3.5–5.2)
BILIRUBIN TOTAL: 0.4 mg/dL (ref 0.2–1.2)
BUN: 13 mg/dL (ref 6–23)
CALCIUM: 9 mg/dL (ref 8.4–10.5)
CO2: 31 meq/L (ref 19–32)
CREATININE: 0.66 mg/dL (ref 0.40–1.20)
Chloride: 103 mEq/L (ref 96–112)
GFR: 100.01 mL/min (ref >60.00)
GLUCOSE: 91 mg/dL (ref 70–99)
Potassium: 4 mEq/L (ref 3.5–5.1)
Sodium: 138 mEq/L (ref 135–145)
TOTAL PROTEIN: 6.8 g/dL (ref 6.0–8.3)

## 2016-10-06 LAB — LIPID PANEL
CHOL/HDL RATIO: 2
Cholesterol: 191 mg/dL (ref 0–200)
HDL: 86.8 mg/dL (ref >39.00)
LDL Cholesterol: 93 mg/dL (ref 0–99)
NONHDL: 103.76
TRIGLYCERIDES: 52 mg/dL (ref 0.0–149.0)
VLDL: 10.4 mg/dL (ref 0.0–40.0)

## 2016-10-06 LAB — HEMOGLOBIN A1C: Hgb A1c MFr Bld: 8.2 % — ABNORMAL HIGH (ref 4.6–6.5)

## 2016-10-06 LAB — MICROALBUMIN / CREATININE URINE RATIO
CREATININE, U: 149.9 mg/dL
Microalb Creat Ratio: 0.5 mg/g (ref 0.0–30.0)
Microalb, Ur: <0.7 mg/dL (ref 0.0–1.9)

## 2016-10-11 ENCOUNTER — Ambulatory Visit (INDEPENDENT_AMBULATORY_CARE_PROVIDER_SITE_OTHER): Payer: Medicare Other | Admitting: Psychiatry

## 2016-10-11 ENCOUNTER — Encounter (HOSPITAL_COMMUNITY): Payer: Self-pay | Admitting: Psychiatry

## 2016-10-11 VITALS — BP 94/78 | HR 88 | Ht 62.0 in | Wt 137.0 lb

## 2016-10-11 DIAGNOSIS — Z79899 Other long term (current) drug therapy: Secondary | ICD-10-CM

## 2016-10-11 DIAGNOSIS — F3130 Bipolar disorder, current episode depressed, mild or moderate severity, unspecified: Secondary | ICD-10-CM | POA: Diagnosis not present

## 2016-10-11 DIAGNOSIS — F5102 Adjustment insomnia: Secondary | ICD-10-CM

## 2016-10-11 DIAGNOSIS — F411 Generalized anxiety disorder: Secondary | ICD-10-CM | POA: Diagnosis not present

## 2016-10-11 MED ORDER — LAMOTRIGINE 25 MG PO TABS: 25.0000 mg | ORAL_TABLET | Freq: Every day | ORAL | 0 refills | Status: DC

## 2016-10-11 MED ORDER — FLUOXETINE HCL 20 MG PO CAPS: 60.0000 mg | ORAL_CAPSULE | Freq: Every day | ORAL | 0 refills | Status: DC

## 2016-10-11 NOTE — Progress Notes (Signed)
Patient ID: Sheryl Matthews, female   DOB: 08/17/1966, 50 y.o.   MRN: [id number]  [place] [id number] Progress Note  Sheryl Matthews [id number] 49 y.o.  10/11/2016 11:06 AM  Chief Complaint:  Bipolar disorder MRE depressed. GAD History of Present Illness:  Patient has been diagnosed with bipolar, returns for follow up.  she is on Wellbutrin Prozac.  Bipolar; patient has not been seen since March said that she had refills and she did reasonable but overall she is expressing more depression down days withdrawn feeling she is disconnect with her husband who has PTSD and is involved in his own activities. Patient is feeling decreased energy and also feeling of hopelessness at times with no suicidal thoughts. Couple weeks ago she said that she was having some high energy and racing thoughts and she went shopping but overall she is concerned about her depression as of now  Insomnia: variable Aggravating factors; stress and poor sleep. Husband keeps him busy but is distant Modifying factors; going to gym Severity of depression; 5 out of 10. 10 being no depression. worsened Context; stress poor sleep Duration; more than 10 years No history of psychotic symptoms currently. Denies drug use or ABUSE of alcohol as of now   Suicidal Ideation: No Plan Formed: No Patient has means to carry out plan: No  Homicidal Ideation: No Plan Formed: No Patient has means to carry out plan: No  Neurologic: Headache: No Seizure: No Paresthesias: No  Past Medical Family, Social History: Husband is finishing his deployment . She is glad that he is home  Outpatient Encounter Prescriptions as of 10/11/2016  Medication Sig  . Alcohol Swabs PADS Use as instructed  . ALPRAZolam (XANAX) 0.25 MG tablet Take 1 tablet (0.25 mg total) by mouth as needed for anxiety.  Marland Kitchen buPROPion (WELLBUTRIN XL) 150 MG 24 hr tablet Take 1 tablet (150 mg total) by mouth daily.  . Emollient (VANICREAM EX) Apply topically as  needed.  . fexofenadine (ALLEGRA) 180 MG tablet Take 1 tablet (180 mg total) by mouth daily.  Marland Kitchen FLUoxetine (PROZAC) 20 MG capsule Take 3 capsules (60 mg total) by mouth daily.  . fluticasone (FLONASE) 50 MCG/ACT nasal spray USE 1 SPRAY IN EACH NOSTRIL DAILY  . FREESTYLE LITE test strip USE AS INSTRUCTED TO CHECK BLOOD SUGAR SIX TIMES PER DAY  . gabapentin (NEURONTIN) 300 MG capsule TAKE 1 CAPSULE DAILY.  Marland Kitchen glucagon 1 MG injection Inject 1 mg intramuscularly for severe hypoglycemia  . insulin aspart (NOVOLOG) 100 UNIT/ML injection Inject 10 Units into the skin 3 (three) times daily before meals.  . Insulin Pen Needle (BD PEN NEEDLE NANO U/F) 32G X 4 MM MISC Use 7 per day to inject insulin  . Insulin Syringe-Needle U-100 (INSULIN SYRINGE .5CC/31GX5/16") 31G X 5/16" 0.5 ML MISC Use to inject insulin 4 times per day  . KETOSTIX strip USE TO CHECK URINE KETONES IF BLOOD SUGAR IS OVER 300 AS DIRECTED BY PHYSICIAN  . lamoTRIgine (LAMICTAL) 25 MG tablet Take 1 tablet (25 mg total) by mouth daily. Take one tablet daily for a week and then start taking 2 tablets.  Marland Kitchen LANTUS SOLOSTAR 100 UNIT/ML Solostar Pen INJECT 20 UNITS UNDER THE SKIN TWICE A DAY  . Multiple Vitamins-Minerals (HAIR VITAMINS PO) Take by mouth daily.  . naproxen (NAPROSYN) 500 MG tablet TAKE 1 TABLET DAILY  . NEXIUM 40 MG capsule TAKE 1 CAPSULE DAILY AT 12 NOON  . Probiotic Product (PROBIOTIC DAILY PO) Take by mouth.  . [DISCONTINUED]  FLUoxetine (PROZAC) 20 MG capsule Take 3 capsules (60 mg total) by mouth daily.   No facility-administered encounter medications on file as of 10/11/2016.     Past Psychiatric History/Hospitalization(s): Anxiety: yes Bipolar Disorder: Yes Depression: yes Mania: No Psychosis: No Schizophrenia: No Personality Disorder: No Hospitalization for psychiatric illness: Yes History of Electroconvulsive Shock Therapy: No   Physical Exam: Constitutional:  BP 94/78   Pulse 88   Ht 5\' 2"  (1.575 m)   Wt  137 lb (62.1 kg)   BMI 25.06 kg/m   General Appearance: alert, oriented, no acute distress ROS: no nausea, vomiting. No chest pain. No rash  Musculoskeletal: Strength & Muscle Tone: within normal limits Gait & Station: normal Patient leans: N/A  Psychiatric: Speech (describe rate, volume, coherence, spontaneity, and abnormalities if any): clear and goal directed, normal rate volume and rhythm  Thought Process (describe rate, content, abstract reasoning, and computation): normal  Associations: Coherent and Relevant  Thoughts: normal  Mental Status: Orientation: oriented to person, place and time/date Mood & Affect: dysphoric  Attention Span & Concentration: good No psychotic symptoms. Alert and oriented with reasonable judjement.     Assessment: Axis I: Bipolar MRE depressed, GAD. insomnia  Plan:  Bipolar: depression worsen.  Continue gabapentin 300mg  qd. Will add lamictal 25mg  increase to 50mg  in one week. Rash and effects reviewed  Depression: Continue Wellbutrin and Prozac not to increase the dose as of now. 90 day supply sent of prozac. She does experience winter depression. After lamictal on board will review if wellbutrin can be increased  Generalized anxiety disorder;  Continue prozac and coping skills,.xanax prn. Has xanax takes prn Insomnia: reviewed sleep hygiene. Use coping skills and or xanax prn. Recommend therapy for relationship concerns and depression More than 50% of time spent in counseling and coordination of care including patient education and review/expectation from medications.  Call 911 and report to local emergency room for any urgent concerns of suicidal thoughts Follow-up's primary care physician regarding to her lab work. Patient is a non-nicotine user Follow up in 3-4  weeks.  Time spent: 25 minutes Sheryl Capron, MD 10/11/2016        Patient ID: Sheryl Matthews, female   DOB: 1966-04-02, 50 y.o.   MRN: [id number]  [place] Progress Note  Sheryl Matthews [id number] 49 y.o.  10/11/2016 11:06 AM  Chief Complaint:  Bipolar disorder MRE depressed History of Present Illness:  Patient has been diagnosed with bipolar, returns for follow up.  she is on Wellbutrin Prozac.  Bipolar; last visit she was okay with the Topamax but apparently she called and wanted to change the gabapentin. Now she is taking Neurontin 300 mg that is keeping her balanced ,  In the past she has had manic episodes when she would do in discrete sex and sees excessive energy and decreased need for sleep in the beginning is difficult for her husband to understand what she is going through Insomnia: variable but recently worse since her sister and her husband has moved in. Her sister has possible bipolar according to her she is up at night and is very perky or moody Aggravating factors; stress and poor sleep Modifying factors; supportive husband and going to gym Severity of depression; 6 out of 10. 10 being no depression Context; stress poor sleep Duration; more than 10 years No history of psychotic symptoms currently. Denies drug use or ABUSE of alcohol as of now   Suicidal Ideation: No Plan Formed: No Patient has means  to carry out plan: No  Homicidal Ideation: No Plan Formed: No Patient has means to carry out plan: No  Neurologic: Headache: No Seizure: No Paresthesias: No  Past Medical Family, Social History: Husband is finishing his deployment . She is glad that he is home  Outpatient Encounter Prescriptions as of 10/11/2016  Medication Sig  . Alcohol Swabs PADS Use as instructed  . ALPRAZolam (XANAX) 0.25 MG tablet Take 1 tablet (0.25 mg total) by mouth as needed for anxiety.  Marland Kitchen buPROPion (WELLBUTRIN XL) 150 MG 24 hr tablet Take 1 tablet (150 mg total) by mouth daily.  . Emollient (VANICREAM EX) Apply topically as needed.  . fexofenadine (ALLEGRA) 180 MG tablet Take 1 tablet (180 mg total) by mouth daily.  Marland Kitchen  FLUoxetine (PROZAC) 20 MG capsule Take 3 capsules (60 mg total) by mouth daily.  . fluticasone (FLONASE) 50 MCG/ACT nasal spray USE 1 SPRAY IN EACH NOSTRIL DAILY  . FREESTYLE LITE test strip USE AS INSTRUCTED TO CHECK BLOOD SUGAR SIX TIMES PER DAY  . gabapentin (NEURONTIN) 300 MG capsule TAKE 1 CAPSULE DAILY.  Marland Kitchen glucagon 1 MG injection Inject 1 mg intramuscularly for severe hypoglycemia  . insulin aspart (NOVOLOG) 100 UNIT/ML injection Inject 10 Units into the skin 3 (three) times daily before meals.  . Insulin Pen Needle (BD PEN NEEDLE NANO U/F) 32G X 4 MM MISC Use 7 per day to inject insulin  . Insulin Syringe-Needle U-100 (INSULIN SYRINGE .5CC/31GX5/16") 31G X 5/16" 0.5 ML MISC Use to inject insulin 4 times per day  . KETOSTIX strip USE TO CHECK URINE KETONES IF BLOOD SUGAR IS OVER 300 AS DIRECTED BY PHYSICIAN  . lamoTRIgine (LAMICTAL) 25 MG tablet Take 1 tablet (25 mg total) by mouth daily. Take one tablet daily for a week and then start taking 2 tablets.  Marland Kitchen LANTUS SOLOSTAR 100 UNIT/ML Solostar Pen INJECT 20 UNITS UNDER THE SKIN TWICE A DAY  . Multiple Vitamins-Minerals (HAIR VITAMINS PO) Take by mouth daily.  . naproxen (NAPROSYN) 500 MG tablet TAKE 1 TABLET DAILY  . NEXIUM 40 MG capsule TAKE 1 CAPSULE DAILY AT 12 NOON  . Probiotic Product (PROBIOTIC DAILY PO) Take by mouth.  . [DISCONTINUED] FLUoxetine (PROZAC) 20 MG capsule Take 3 capsules (60 mg total) by mouth daily.   No facility-administered encounter medications on file as of 10/11/2016.     Past Psychiatric History/Hospitalization(s): Anxiety: yes Bipolar Disorder: Yes Depression: yes Mania: No Psychosis: No Schizophrenia: No Personality Disorder: No Hospitalization for psychiatric illness: Yes History of Electroconvulsive Shock Therapy: No Prior Suicide Attempts: Yes  6 months ago OD'd no hospitalization  Physical Exam: Constitutional:  BP 94/78   Pulse 88   Ht 5\' 2"  (1.575 m)   Wt 137 lb (62.1 kg)   BMI 25.06  kg/m   General Appearance: alert, oriented, no acute distress ROS: no nausea, vomiting. No chest pain. No rash  Musculoskeletal: Strength & Muscle Tone: within normal limits Gait & Station: normal Patient leans: N/A  Psychiatric: Speech (describe rate, volume, coherence, spontaneity, and abnormalities if any): clear and goal directed, normal rate volume and rhythm  Thought Process (describe rate, content, abstract reasoning, and computation): normal  Associations: Coherent and Relevant  Thoughts: normal  Mental Status: Orientation: oriented to person, place and time/date Mood & Affect: dysphoric with constricted affect Attention Span & Concentration: good No psychotic symptoms. Alert and oriented with reasonable judjement.     Assessment: Axis I: Bipolar MRE depressed, GAD. insomnia  Plan:  Bipolar:  Continue gabapentin 300mg  qd. Gets from primary care Depression: Continue Wellbutrin and Prozac not to increase the dose as of now. 90 day supply sent Generalized anxiety disorder;  Continue prozac and coping skills,.xanax prn. Printed out this time small dose 0.25mg  qd prn. Insomnia: reviewed sleep hygiene. Use coping skills and or xanax prn. She  Talked about her sister who may need psych help . More than 50% of time spent in counseling and coordination of care including patient education and review/expectation from medications.  Call 911 and report to local emergency room for any urgent concerns of suicidal thoughts Follow-up's primary care physician regarding to her lab work. Patient is a non-nicotine user Follow up in 8  weeks.  Time spent: 25 minutes Sheryl Capron, MD 10/11/2016

## 2016-10-12 ENCOUNTER — Encounter: Payer: Self-pay | Admitting: Endocrinology

## 2016-10-12 ENCOUNTER — Ambulatory Visit (INDEPENDENT_AMBULATORY_CARE_PROVIDER_SITE_OTHER): Payer: Medicare Other | Admitting: Endocrinology

## 2016-10-12 ENCOUNTER — Other Ambulatory Visit: Payer: Self-pay | Admitting: Physician Assistant

## 2016-10-12 VITALS — BP 116/68 | HR 86 | Ht 64.0 in | Wt 143.0 lb

## 2016-10-12 DIAGNOSIS — E1065 Type 1 diabetes mellitus with hyperglycemia: Secondary | ICD-10-CM | POA: Diagnosis not present

## 2016-10-12 NOTE — Progress Notes (Signed)
Patient ID: Sheryl Matthews, female   DOB: [birth year], 50 y.o.   MRN: [id number]          Reason for Appointment : Follow-up for Type 1 Diabetes  History of Present Illness          Diagnosis: Type 1 diabetes mellitus, date of diagnosis:  at age 59    Previous history:   She has been on insulin since the time of diagnosis. Also appears to be on the same program of NPH and Regular Insulin since diagnosis was made   Recent history:   INSULIN regimen is described as:  Lantus 8 U bid, Novolog 6-7 ac. 1:50 correction.   Her A1c is still relatively high at 8.2  Glucose patterns, management and problems identified:      FASTING blood sugars are generally higher than before and more consistently high and has only sporadically normal to low readings in the last 2 weeks  Blood sugars are relatively better midday but periodically high at suppertime also  More recently her sugars are not is high the evening for the last few days even though she has not been exercising  She things that usually when she exercises her blood sugars will be relatively lower for the afternoon also and she is generally not taking any insulin for lunch at that time  More recently her appetite has been significantly decreased  Also because of her depression she is not watching her diet and eating whatever she can find including sweets like cakes  Also has not been motivated to exercise for about 2 weeks  Although she has a DexCom she does not know how to use it and has not scheduled a training session with nurse educator  Postprandial readings are difficult to assess, not clear which readings are post prandial but they are not consistently high after supper; occasionally may be lowered when she does not eat as much  She does not understand how to count carbohydrates or use a carbohydrate factor for covering her meals  At breakfast is getting less protein and relatively high carbohydrate with oatmeal and  fruit HYPOGLYCEMIA: She was having more episodes about a week or 2 ago randomly midday and afternoon and sometimes in the evenings but rarely after supper  Glucose monitoring:  is being done recently up to 6 times a day         Glucometer:  Freestyle .      Blood Glucose readings from meter download   Mean values apply above for all meters except median for One Touch  PRE-MEAL Fasting Midday  Dinner Bedtime Overall  Glucose range: 59-325  47-244   50-423  56-236    Mean/median: 215 143  193  117  179+/-87     Hypoglycemia:  Factors causing hyperglycemia: Reduced food intake, exercise Symptoms of hypoglycemia: Weakness, dizziness, difficulty thinking, feels sweaty and shaky only if blood sugar in the 30s.  May recognize symptoms low 60 at times but inconsistently.  Has had seizures during the night from hypoglycemia Treatment of hypoglycemia: juice, peanut butter, Glucagon      Diabetes labs:  Lab Results  Component Value Date   HGBA1C 8.2 (H) 10/06/2016   HGBA1C 8.1 (H) 06/16/2016   HGBA1C 7.9 11/25/2015   Lab Results  Component Value Date   MICROALBUR <0.7 10/06/2016   [place] 93 10/06/2016   CREATININE 0.66 10/06/2016      Medication List       Accurate as of 10/12/16  3:51  PM. Always use your most recent med list.          Alcohol Swabs Pads Use as instructed   ALPRAZolam 0.25 MG tablet Commonly known as:  XANAX Take 1 tablet (0.25 mg total) by mouth as needed for anxiety.   buPROPion 150 MG 24 hr tablet Commonly known as:  WELLBUTRIN XL Take 1 tablet (150 mg total) by mouth daily.   fexofenadine 180 MG tablet Commonly known as:  ALLEGRA Take 1 tablet (180 mg total) by mouth daily.   FLUoxetine 20 MG capsule Commonly known as:  PROZAC Take 3 capsules (60 mg total) by mouth daily.   fluticasone 50 MCG/ACT nasal spray Commonly known as:  FLONASE USE 1 SPRAY IN EACH NOSTRIL DAILY   FREESTYLE LITE test strip Generic drug:  glucose blood USE AS  INSTRUCTED TO CHECK BLOOD SUGAR SIX TIMES PER DAY   gabapentin 300 MG capsule Commonly known as:  NEURONTIN TAKE 1 CAPSULE DAILY.   glucagon 1 MG injection Inject 1 mg intramuscularly for severe hypoglycemia   HAIR VITAMINS PO Take by mouth daily.   insulin aspart 100 UNIT/ML injection Commonly known as:  NOVOLOG Inject 10 Units into the skin 3 (three) times daily before meals.   Insulin Pen Needle 32G X 4 MM Misc Commonly known as:  BD PEN NEEDLE NANO U/F Use 7 per day to inject insulin   INSULIN SYRINGE .5CC/31GX5/16" 31G X 5/16" 0.5 ML Misc Use to inject insulin 4 times per day   KETOSTIX strip Generic drug:  acetone (urine) test USE TO CHECK URINE KETONES IF BLOOD SUGAR IS OVER 300 AS DIRECTED BY PHYSICIAN   lamoTRIgine 25 MG tablet Commonly known as:  LAMICTAL Take 1 tablet (25 mg total) by mouth daily. Take one tablet daily for a week and then start taking 2 tablets.   LANTUS SOLOSTAR 100 UNIT/ML Solostar Pen Generic drug:  Insulin Glargine INJECT 20 UNITS UNDER THE SKIN TWICE A DAY   naproxen 500 MG tablet Commonly known as:  NAPROSYN TAKE 1 TABLET DAILY   NEXIUM 40 MG capsule Generic drug:  esomeprazole TAKE 1 CAPSULE DAILY AT 12 NOON   PROBIOTIC DAILY PO Take by mouth.   VANICREAM EX Apply topically as needed.       Allergies:  Allergies  Allergen Reactions  . Latex Rash  . Tape Rash  . Lyrica [Pregabalin]     Did not have benefit.     Past Medical History:  Diagnosis Date  . Depression   . Diabetes mellitus without complication ([place])     No past surgical history on file.  Family History  Problem Relation Age of Onset  . Depression Sister   . Alcohol abuse Brother   . Diabetes Brother     Social History:  reports that she has never smoked. She has never used smokeless tobacco. She reports that she does not drink alcohol or use drugs.    Review of Systems   ROS      Lipids: Not on any statins  Lab Results  Component Value  Date   CHOL 191 10/06/2016   HDL 86.80 10/06/2016   LDLCALC 93 10/06/2016   TRIG 52.0 10/06/2016   CHOLHDL 2 10/06/2016      She Has had nausea and abdominal discomfort, not seeing locally recently  Depression: She is now starting Lamictal       She has a history of Numbness and, tingling in her feet for about a year.  LABS:  Lab on 10/06/2016  Component Date Value Ref Range Status  . Hgb A1c MFr Bld 10/06/2016 8.2* 4.6 - 6.5 % Final  . Sodium 10/06/2016 138  135 - 145 mEq/L Final  . Potassium 10/06/2016 4.0  3.5 - 5.1 mEq/L Final  . Chloride 10/06/2016 103  96 - 112 mEq/L Final  . CO2 10/06/2016 31  19 - 32 mEq/L Final  . Glucose, Bld 10/06/2016 91  70 - 99 mg/dL Final  . BUN 10/06/2016 13  6 - 23 mg/dL Final  . Creatinine, Ser 10/06/2016 0.66  0.40 - 1.20 mg/dL Final  . Total Bilirubin 10/06/2016 0.4  0.2 - 1.2 mg/dL Final  . Alkaline Phosphatase 10/06/2016 69  39 - 117 U/L Final  . AST 10/06/2016 17  0 - 37 U/L Final  . ALT 10/06/2016 15  0 - 35 U/L Final  . Total Protein 10/06/2016 6.8  6.0 - 8.3 g/dL Final  . Albumin 10/06/2016 3.9  3.5 - 5.2 g/dL Final  . Calcium 10/06/2016 9.0  8.4 - 10.5 mg/dL Final  . GFR 10/06/2016 100.01  >60.00 mL/min Final  . Cholesterol 10/06/2016 191  0 - 200 mg/dL Final  . Triglycerides 10/06/2016 52.0  0.0 - 149.0 mg/dL Final  . HDL 10/06/2016 86.80  >39.00 mg/dL Final  . VLDL 10/06/2016 10.4  0.0 - 40.0 mg/dL Final  . LDL Cholesterol 10/06/2016 93  0 - 99 mg/dL Final  . Total CHOL/HDL Ratio 10/06/2016 2   Final  . NonHDL 10/06/2016 103.76   Final  . Microalb, Ur 10/06/2016 <0.7  0.0 - 1.9 mg/dL Final  . Creatinine,U 10/06/2016 149.9  mg/dL Final  . Microalb Creat Ratio 10/06/2016 0.5  0.0 - 30.0 mg/g Final    Physical Examination:  BP 116/68   Pulse 86   Ht 5\' 4"  (1.626 m)   Wt 143 lb (64.9 kg)   SpO2 97%   BMI 24.55 kg/m      ASSESSMENT:  Diabetes type 1, long-standing and poorly controlled See history of present  illness for detailed discussion of current diabetes management, blood sugar patterns and problems identified  A1c is still high at 8.2 Difficulties with her control include use of Lantus which is not keeping her sugars consistently controlled as well as inconsistent diet, depression leading to poor eating habits, lack of consistent exercise and variability in blood sugars with periodic hypoglycemia  She is still not  able to do carbohydrate counting She does need to start her DexCom monitor with the help of the nurse educator Her insurance plan does not have [place] on the formulary and [place] was denied last year   PLAN:   Since fasting blood sugars are recently high most of the time she will increase her evening Lantus to 10 units Discussed adjusting this every 3 days by a unit to get morning sugars between 100 and 150 She will discuss using carbohydrate counting for covering her meals Hopefully she will do better with her diet and starting her new antidepressant, emphasized need for avoiding high-fat meals which are difficult to cover Still continue to use about 1:60 as correction factor Restart exercise been able to Recommended she discuss day-to-day management with nurse educator again especially mealtime insulin coverage  Also consider using a FreeStyle leave rate if she cannot afford DexCom, currently has only the G4 available 1 May need to replace her old FreeStyle meter   Patient Instructions  Lantus 10 units at bedtime and adjust every 3 days  to keep am sugar in 100-150 range         Counseling time on subjects discussed above is over 50% of today's 25 minute visit    Abbegayle Denault 10/12/2016, 3:51 PM   Note: This note was prepared with Dragon voice recognition system technology. Any transcriptional errors that result from this process are unintentional.

## 2016-10-12 NOTE — Patient Instructions (Addendum)
Lantus 10 units at bedtime and adjust every 3 days to keep am sugar in 100-150 range

## 2016-10-13 DIAGNOSIS — M25521 Pain in right elbow: Secondary | ICD-10-CM | POA: Diagnosis not present

## 2016-10-18 DIAGNOSIS — K625 Hemorrhage of anus and rectum: Secondary | ICD-10-CM | POA: Diagnosis not present

## 2016-10-18 LAB — HM COLONOSCOPY

## 2016-10-26 ENCOUNTER — Encounter: Payer: Self-pay | Admitting: Physician Assistant

## 2016-10-30 ENCOUNTER — Encounter: Payer: Self-pay | Admitting: Physician Assistant

## 2016-10-30 ENCOUNTER — Telehealth: Payer: Self-pay | Admitting: *Deleted

## 2016-10-30 ENCOUNTER — Ambulatory Visit (INDEPENDENT_AMBULATORY_CARE_PROVIDER_SITE_OTHER): Payer: Medicare Other | Admitting: Physician Assistant

## 2016-10-30 VITALS — BP 103/73 | HR 99 | Wt 143.0 lb

## 2016-10-30 DIAGNOSIS — N760 Acute vaginitis: Secondary | ICD-10-CM

## 2016-10-30 DIAGNOSIS — B373 Candidiasis of vulva and vagina: Secondary | ICD-10-CM

## 2016-10-30 DIAGNOSIS — B9689 Other specified bacterial agents as the cause of diseases classified elsewhere: Secondary | ICD-10-CM

## 2016-10-30 DIAGNOSIS — R300 Dysuria: Secondary | ICD-10-CM | POA: Diagnosis not present

## 2016-10-30 DIAGNOSIS — B3731 Acute candidiasis of vulva and vagina: Secondary | ICD-10-CM

## 2016-10-30 LAB — WET PREP FOR TRICH, YEAST, CLUE: Trich, Wet Prep: NONE SEEN

## 2016-10-30 LAB — POCT URINALYSIS DIPSTICK
Bilirubin, UA: NEGATIVE
Glucose, UA: NEGATIVE
Nitrite, UA: NEGATIVE
PH UA: 5.5
PROTEIN UA: NEGATIVE
RBC UA: NEGATIVE
SPEC GRAV UA: 1.025
UROBILINOGEN UA: 0.2

## 2016-10-30 MED ORDER — FLUCONAZOLE 150 MG PO TABS: ORAL_TABLET | ORAL | 0 refills | Status: DC

## 2016-10-30 MED ORDER — TRIAMCINOLONE ACETONIDE 0.1 % EX CREA: 1.0000 "application " | TOPICAL_CREAM | Freq: Two times a day (BID) | CUTANEOUS | 0 refills | Status: DC

## 2016-10-30 MED ORDER — METRONIDAZOLE 500 MG PO TABS: 500.0000 mg | ORAL_TABLET | Freq: Two times a day (BID) | ORAL | 0 refills | Status: DC

## 2016-10-30 NOTE — Progress Notes (Addendum)
   Subjective:    Patient ID: Yeilani Wolle, female    DOB: 05-16-1966, 50 y.o.   MRN: [id number]  HPI Patient is a 50 yo female coming to the clinic today complaining of vaginal itching and burning that started yesterday. Patient reports vaginal discharge which she describes as white and looks like cottage cheese. Patient started using vagasil cream yesterday to help with itching which has given patient some relief. Patient is also experiencing dysuria since yesterday. She also reports burning to by her anus also. Patient denies abdominal pain and diarrhea. Patient denies hematuria and urinary frequency. Pt denies any fever or chills.    Review of Systems See HPI    Objective: Blood pressure 103/73, pulse 99, weight 143 lb (64.9 kg).   Physical Exam  Constitutional: She appears well-developed and well-nourished. No distress.  Abdominal: Soft. There is no tenderness.  Psychiatric: She has a normal mood and affect. Her behavior is normal.   Microscopic wet-mount exam shows clue cells and budding yeast.   Urinalysis    Component Value Date/Time   BILIRUBINUR negative 10/30/2016 1521   PROTEINUR negative 10/30/2016 1521   UROBILINOGEN 0.2 10/30/2016 1521   NITRITE negative 10/30/2016 1521   LEUKOCYTESUR small (1+) (A) 10/30/2016 1521       Assessment & Plan:  Meesha was seen today for vaginal discharge and urinary tract infection.  Diagnoses and all orders for this visit:  1. Vaginal yeast infection -     WET PREP FOR TRICH, YEAST, CLUE- Positive for budding yeast and clue cells. -  Start fluconazole (DIFLUCAN) 150 MG tablet; Take one tablet for yeast infection. If symptoms persist in the next 48 hours then take the second tablet.  -Follow-up as needed or if symptoms worsen.   2. Bacterial Vaginosis  -     WET PREP FOR TRICH, YEAST, CLUE- positive for clue cells and budding yeast  - Metronidazole 500 mg two times a day for seven days.  -Follow-up as needed or if symptoms  worsen.  3. Dysuria -     Urinalysis Dipstick- Positive for leukocytes -     WET PREP FOR TRICH, YEAST, CLUE- Positive for budding yeast and clue cells. -    Urine Culture due to dysuria and positive leukocytes.  -Will call patient with results of urine culture

## 2016-10-30 NOTE — Telephone Encounter (Signed)
rx sent

## 2016-11-01 ENCOUNTER — Other Ambulatory Visit: Payer: Self-pay | Admitting: *Deleted

## 2016-11-01 DIAGNOSIS — B373 Candidiasis of vulva and vagina: Secondary | ICD-10-CM

## 2016-11-01 DIAGNOSIS — B3731 Acute candidiasis of vulva and vagina: Secondary | ICD-10-CM

## 2016-11-01 LAB — URINE CULTURE: ORGANISM ID, BACTERIA: NO GROWTH

## 2016-11-01 MED ORDER — FLUCONAZOLE 150 MG PO TABS: ORAL_TABLET | ORAL | 0 refills | Status: DC

## 2016-11-08 ENCOUNTER — Other Ambulatory Visit: Payer: Self-pay

## 2016-11-08 ENCOUNTER — Telehealth: Payer: Self-pay | Admitting: Endocrinology

## 2016-11-08 ENCOUNTER — Encounter: Payer: Medicare Other | Attending: Endocrinology | Admitting: Nutrition

## 2016-11-08 DIAGNOSIS — Z713 Dietary counseling and surveillance: Secondary | ICD-10-CM | POA: Diagnosis not present

## 2016-11-08 DIAGNOSIS — E1065 Type 1 diabetes mellitus with hyperglycemia: Secondary | ICD-10-CM | POA: Diagnosis not present

## 2016-11-08 MED ORDER — INSULIN ASPART 100 UNIT/ML ~~LOC~~ SOLN: 10.0000 [IU] | Freq: Three times a day (TID) | SUBCUTANEOUS | 1 refills | Status: DC

## 2016-11-08 NOTE — Telephone Encounter (Signed)
Pt needs refill on novolog [facility]

## 2016-11-08 NOTE — Telephone Encounter (Signed)
Ordered 11/08/16

## 2016-11-08 NOTE — Progress Notes (Signed)
Patient and her husband are here to learn about sensor insertion and readings.  We discussed the difference between blood sugar and sensor glucose levels.  They reported good understanding of this.   They were shown how to insert the sensor and they inserted it in her left upper outer arm, without any difficulty.  We attached the transmitter and set up the sensor to alarm when blood sugars go over 250, and less than 85.  She was shown how to calibrate the sensor and how to put in events like carbs, insulin and exercise.  They reported good understanding of all of the above and had no final questions.

## 2016-11-08 NOTE — Patient Instructions (Signed)
Insert a new sensor every 7 days. Calibrate the sensor every 12 hours, more times are better. Call if questions.

## 2016-11-09 ENCOUNTER — Ambulatory Visit (HOSPITAL_COMMUNITY): Payer: Self-pay | Admitting: Psychiatry

## 2016-11-14 ENCOUNTER — Other Ambulatory Visit: Payer: Self-pay

## 2016-11-14 ENCOUNTER — Telehealth: Payer: Self-pay | Admitting: Endocrinology

## 2016-11-14 NOTE — Telephone Encounter (Signed)
Pt called in highly upset that her Novolog was sent in for Vials and not Pens, please resubmit the order for her Flex Pens to [facility].

## 2016-11-15 ENCOUNTER — Other Ambulatory Visit: Payer: Self-pay | Admitting: *Deleted

## 2016-11-17 ENCOUNTER — Other Ambulatory Visit: Payer: Self-pay

## 2016-11-17 DIAGNOSIS — E1065 Type 1 diabetes mellitus with hyperglycemia: Secondary | ICD-10-CM

## 2016-11-17 MED ORDER — INSULIN ASPART 100 UNIT/ML FLEXPEN: PEN_INJECTOR | SUBCUTANEOUS | 1 refills | Status: DC

## 2016-11-17 NOTE — Telephone Encounter (Signed)
You can change to the FlexPen, same dose

## 2016-11-17 NOTE — Telephone Encounter (Signed)
New rx Novolog Flex pen [facility]

## 2016-11-17 NOTE — Telephone Encounter (Signed)
Patient is taking Novolog 100 units and received her medication in a vial. She states she needs the flexpen and currently she in her medication only the vial is available. I contacted [facility] and had them hold on further orders if she needs to receive either vials or the flexpen. Please advise

## 2016-11-24 ENCOUNTER — Telehealth: Payer: Self-pay | Admitting: Physician Assistant

## 2016-11-24 MED ORDER — AMMONIUM LACTATE 12 % EX LOTN: 1.0000 "application " | TOPICAL_LOTION | CUTANEOUS | 0 refills | Status: DC | PRN

## 2016-11-24 NOTE — Telephone Encounter (Signed)
Pt called clinic today requesting Rx for lac-hydrin ten. Pt states she has very bad eczema and this is the only thing she has found that works. PCP sent in Rx for kenalog cream but it did not help. Pt would like Rx sent to local and mail order. Will route to a Provider in office today for review.

## 2016-11-24 NOTE — Telephone Encounter (Signed)
Okay I sent in [facility]. She has a few other local pharmacies on file, can we confirm which one she'd like to to use and reorder it to local pharmacy as well? Thank you.

## 2016-11-24 NOTE — Telephone Encounter (Signed)
Rx sent to local [facility] per Pt request.

## 2016-11-30 DIAGNOSIS — M542 Cervicalgia: Secondary | ICD-10-CM | POA: Diagnosis not present

## 2016-11-30 DIAGNOSIS — Z79891 Long term (current) use of opiate analgesic: Secondary | ICD-10-CM | POA: Diagnosis not present

## 2016-11-30 DIAGNOSIS — M5030 Other cervical disc degeneration, unspecified cervical region: Secondary | ICD-10-CM | POA: Diagnosis not present

## 2016-11-30 DIAGNOSIS — M791 Myalgia: Secondary | ICD-10-CM | POA: Diagnosis not present

## 2016-12-01 ENCOUNTER — Telehealth: Payer: Self-pay | Admitting: Endocrinology

## 2016-12-01 ENCOUNTER — Other Ambulatory Visit: Payer: Self-pay | Admitting: *Deleted

## 2016-12-01 DIAGNOSIS — E1065 Type 1 diabetes mellitus with hyperglycemia: Secondary | ICD-10-CM

## 2016-12-01 MED ORDER — INSULIN ASPART 100 UNIT/ML FLEXPEN: PEN_INJECTOR | SUBCUTANEOUS | 1 refills | Status: DC

## 2016-12-01 NOTE — Telephone Encounter (Signed)
Called patient, LM adving that the requested Refill has been sent

## 2016-12-01 NOTE — Telephone Encounter (Signed)
Pt called and said that she only received one box of her Novolog Flexpen and she needs 4 boxes.  She would like this resubmitted to [facility] as she is already running out of insulin.

## 2016-12-05 DIAGNOSIS — M0579 Rheumatoid arthritis with rheumatoid factor of multiple sites without organ or systems involvement: Secondary | ICD-10-CM | POA: Diagnosis not present

## 2016-12-05 DIAGNOSIS — M797 Fibromyalgia: Secondary | ICD-10-CM | POA: Diagnosis not present

## 2016-12-06 ENCOUNTER — Other Ambulatory Visit: Payer: Self-pay | Admitting: Physician Assistant

## 2016-12-06 DIAGNOSIS — N63 Unspecified lump in unspecified breast: Secondary | ICD-10-CM

## 2016-12-11 ENCOUNTER — Ambulatory Visit (INDEPENDENT_AMBULATORY_CARE_PROVIDER_SITE_OTHER): Payer: TRICARE For Life (TFL) | Admitting: Osteopathic Medicine

## 2016-12-11 VITALS — BP 128/87 | HR 67 | Wt 145.0 lb

## 2016-12-11 DIAGNOSIS — M4802 Spinal stenosis, cervical region: Secondary | ICD-10-CM | POA: Diagnosis not present

## 2016-12-11 DIAGNOSIS — M47812 Spondylosis without myelopathy or radiculopathy, cervical region: Secondary | ICD-10-CM | POA: Diagnosis not present

## 2016-12-11 DIAGNOSIS — M791 Myalgia: Secondary | ICD-10-CM | POA: Diagnosis not present

## 2016-12-11 DIAGNOSIS — Z0289 Encounter for other administrative examinations: Secondary | ICD-10-CM

## 2016-12-11 DIAGNOSIS — M542 Cervicalgia: Secondary | ICD-10-CM | POA: Diagnosis not present

## 2016-12-11 DIAGNOSIS — M5030 Other cervical disc degeneration, unspecified cervical region: Secondary | ICD-10-CM | POA: Diagnosis not present

## 2016-12-12 ENCOUNTER — Ambulatory Visit: Payer: Self-pay | Admitting: Endocrinology

## 2016-12-12 DIAGNOSIS — Z3202 Encounter for pregnancy test, result negative: Secondary | ICD-10-CM | POA: Diagnosis not present

## 2016-12-12 DIAGNOSIS — N904 Leukoplakia of vulva: Secondary | ICD-10-CM | POA: Diagnosis not present

## 2016-12-12 DIAGNOSIS — N909 Noninflammatory disorder of vulva and perineum, unspecified: Secondary | ICD-10-CM | POA: Diagnosis not present

## 2016-12-12 DIAGNOSIS — R1030 Lower abdominal pain, unspecified: Secondary | ICD-10-CM | POA: Diagnosis not present

## 2016-12-12 DIAGNOSIS — N952 Postmenopausal atrophic vaginitis: Secondary | ICD-10-CM | POA: Diagnosis not present

## 2016-12-12 DIAGNOSIS — D229 Melanocytic nevi, unspecified: Secondary | ICD-10-CM | POA: Diagnosis not present

## 2016-12-12 NOTE — Progress Notes (Signed)
Patient arrived then decided she didn't want to be seen, not sure how to bill this, assistance appreciated, thanks!

## 2016-12-15 ENCOUNTER — Ambulatory Visit (HOSPITAL_COMMUNITY): Payer: Self-pay | Admitting: Psychiatry

## 2016-12-17 NOTE — Progress Notes (Signed)
Dr. Sheppard Coil,  That still counts as a "no show" because the appointment slot was blocked for the patient. I would recommended sending the patient a letter to advice that second no show or abrupt cancellation could resort in a charge of [phone] per our policy.   Thanks  Rodman Key

## 2016-12-18 ENCOUNTER — Ambulatory Visit (INDEPENDENT_AMBULATORY_CARE_PROVIDER_SITE_OTHER): Payer: Medicare Other | Admitting: Psychiatry

## 2016-12-18 ENCOUNTER — Telehealth (HOSPITAL_COMMUNITY): Payer: Self-pay | Admitting: Psychiatry

## 2016-12-18 ENCOUNTER — Encounter (HOSPITAL_COMMUNITY): Payer: Self-pay | Admitting: Psychiatry

## 2016-12-18 VITALS — BP 118/80 | HR 80 | Wt 137.0 lb

## 2016-12-18 DIAGNOSIS — F3130 Bipolar disorder, current episode depressed, mild or moderate severity, unspecified: Secondary | ICD-10-CM

## 2016-12-18 DIAGNOSIS — Z794 Long term (current) use of insulin: Secondary | ICD-10-CM | POA: Diagnosis not present

## 2016-12-18 DIAGNOSIS — Z79899 Other long term (current) drug therapy: Secondary | ICD-10-CM | POA: Diagnosis not present

## 2016-12-18 DIAGNOSIS — F5102 Adjustment insomnia: Secondary | ICD-10-CM | POA: Diagnosis not present

## 2016-12-18 DIAGNOSIS — F411 Generalized anxiety disorder: Secondary | ICD-10-CM

## 2016-12-18 MED ORDER — BUPROPION HCL ER (XL) 150 MG PO TB24: 150.0000 mg | ORAL_TABLET | Freq: Every day | ORAL | 0 refills | Status: DC

## 2016-12-18 MED ORDER — FLUOXETINE HCL 20 MG PO CAPS: 60.0000 mg | ORAL_CAPSULE | Freq: Every day | ORAL | 0 refills | Status: DC

## 2016-12-18 MED ORDER — ALPRAZOLAM 0.25 MG PO TABS: 0.2500 mg | ORAL_TABLET | ORAL | 0 refills | Status: DC | PRN

## 2016-12-18 NOTE — Progress Notes (Signed)
[facility] [phone] Progress Note  Sheryl Matthews [id number] 51 y.o.  12/18/2016 4:22 PM  Chief Complaint:  Bipolar disorder MRE depressed History of Present Illness:  Patient has been diagnosed with bipolar, returns for follow up.  she is on Wellbutrin Prozac.  Bipolar disorder; last visit we added lamictal,  but she felt that she has used it before and it did not work so she stopped it. States that she is okay with the gabapentin mood fluctuates but not overall manic or hypomanic Worried about her dog who is sick depressionL fluctuates. On wellbutrin and prozac.  No side effects  Insomnia: variable depending upon stress.  Aggravating factors; stress and poor sleep Modifying factors; supportive husband and going to gym Severity of depression; 6 out of 10. 10 being no depression Context; stress poor sleep Duration; more than 10 years No history of psychotic symptoms currently. Denies drug use or ABUSE of alcohol as of now     Past Medical Family, Social History: Husband is finishing his deployment . She is glad that he is home  Outpatient Encounter Prescriptions as of 12/18/2016  Medication Sig  . Alcohol Swabs PADS Use as instructed  . ALPRAZolam (XANAX) 0.25 MG tablet Take 1 tablet (0.25 mg total) by mouth as needed for anxiety.  Marland Kitchen ammonium lactate (LAC-HYDRIN TWELVE) 12 % lotion Apply 1 application topically as needed for dry skin.  Marland Kitchen buPROPion (WELLBUTRIN XL) 150 MG 24 hr tablet Take 1 tablet (150 mg total) by mouth daily.  . Emollient (VANICREAM EX) Apply topically as needed.  . fluconazole (DIFLUCAN) 150 MG tablet Take one tablet for yeast infection. If symptoms persist in the next 48 hours then take the second tablet.  Marland Kitchen FLUoxetine (PROZAC) 20 MG capsule Take 3 capsules (60 mg total) by mouth daily.  Marland Kitchen FREESTYLE LITE test strip USE AS INSTRUCTED TO CHECK BLOOD SUGAR SIX TIMES PER DAY  . gabapentin (NEURONTIN) 300 MG capsule TAKE 1 CAPSULE DAILY.  Marland Kitchen  glucagon 1 MG injection Inject 1 mg intramuscularly for severe hypoglycemia  . insulin aspart (NOVOLOG FLEXPEN) 100 UNIT/ML FlexPen Inject 4-6 units three times a day with meals  . Insulin Pen Needle (BD PEN NEEDLE NANO U/F) 32G X 4 MM MISC Use 7 per day to inject insulin  . Insulin Syringe-Needle U-100 (INSULIN SYRINGE .5CC/31GX5/16") 31G X 5/16" 0.5 ML MISC Use to inject insulin 4 times per day  . KETOSTIX strip USE TO CHECK URINE KETONES IF BLOOD SUGAR IS OVER 300 AS DIRECTED BY PHYSICIAN  . LANTUS SOLOSTAR 100 UNIT/ML Solostar Pen INJECT 20 UNITS UNDER THE SKIN TWICE A DAY  . Multiple Vitamins-Minerals (HAIR VITAMINS PO) Take by mouth daily.  . naproxen (NAPROSYN) 500 MG tablet TAKE 1 TABLET DAILY  . NEXIUM 40 MG capsule TAKE 1 CAPSULE DAILY AT 12 NOON  . Probiotic Product (PROBIOTIC DAILY PO) Take by mouth.  . [DISCONTINUED] ALPRAZolam (XANAX) 0.25 MG tablet Take 1 tablet (0.25 mg total) by mouth as needed for anxiety.  . [DISCONTINUED] buPROPion (WELLBUTRIN XL) 150 MG 24 hr tablet Take 1 tablet (150 mg total) by mouth daily.  . [DISCONTINUED] fexofenadine (ALLEGRA) 180 MG tablet Take 1 tablet (180 mg total) by mouth daily.  . [DISCONTINUED] FLUoxetine (PROZAC) 20 MG capsule Take 3 capsules (60 mg total) by mouth daily.  . [DISCONTINUED] fluticasone (FLONASE) 50 MCG/ACT nasal spray USE 1 SPRAY IN EACH NOSTRIL DAILY  . [DISCONTINUED] lamoTRIgine (LAMICTAL) 25 MG tablet Take 1 tablet (  25 mg total) by mouth daily. Take one tablet daily for a week and then start taking 2 tablets.  . [DISCONTINUED] metroNIDAZOLE (FLAGYL) 500 MG tablet Take 1 tablet (500 mg total) by mouth 2 (two) times daily.   No facility-administered encounter medications on file as of 12/18/2016.      Physical Exam: Constitutional:  BP 118/80   Pulse 80   Wt 137 lb (62.1 kg)   BMI 23.52 kg/m   General Appearance: alert, oriented, no acute distress ROS: no rash. Depression fluctutates. No chest  pain    Psychiatric: Speech (describe rate, volume, coherence, spontaneity, and abnormalities if any): clear and goal directed, normal rate volume and rhythm  Thought Process (describe rate, content, abstract reasoning, and computation): normal  Associations: Coherent and Relevant  Thoughts: normal  Mental Status: Orientation: oriented to person, place and time/date Mood & Affect: less dysphoric, affect remains constricted Attention Span & Concentration: good No psychotic symptoms. Alert and oriented with reasonable judjement.     Assessment: Axis I: Bipolar MRE depressed, GAD. insomnia  Plan:  Bipolar:  Continue gabapentin. Gets from [facility]. Can call for refills   Depression: not worsened. Continue prozac and wellbutrin. 90 days supply sent Generalized anxiety disorder;  Varies. Continue prozac and prn xanax printed. 0.25mg .  Insomnia: reviewed sleep hygiene. Use coping skills and or xanax prn. Provided supportive therapy and reviewed side effects and questions answered.  Merian Capron, MD 12/18/2016

## 2016-12-18 NOTE — Telephone Encounter (Signed)
Pt wants to have gene sight testing. De Nurse told her to meet with you  Please advise.

## 2016-12-21 NOTE — Telephone Encounter (Signed)
Return telephone to patient. Pt will stop by office next week to complete Gene Sight Testing.

## 2016-12-26 ENCOUNTER — Encounter: Payer: Self-pay | Admitting: Physician Assistant

## 2016-12-26 DIAGNOSIS — M797 Fibromyalgia: Secondary | ICD-10-CM | POA: Insufficient documentation

## 2016-12-28 DIAGNOSIS — M797 Fibromyalgia: Secondary | ICD-10-CM | POA: Diagnosis not present

## 2016-12-28 DIAGNOSIS — M5412 Radiculopathy, cervical region: Secondary | ICD-10-CM | POA: Diagnosis not present

## 2016-12-28 DIAGNOSIS — G5603 Carpal tunnel syndrome, bilateral upper limbs: Secondary | ICD-10-CM | POA: Diagnosis not present

## 2016-12-28 DIAGNOSIS — M150 Primary generalized (osteo)arthritis: Secondary | ICD-10-CM | POA: Diagnosis not present

## 2017-01-02 ENCOUNTER — Other Ambulatory Visit: Payer: Self-pay

## 2017-01-08 ENCOUNTER — Ambulatory Visit: Payer: Self-pay | Admitting: Endocrinology

## 2017-01-15 DIAGNOSIS — M25521 Pain in right elbow: Secondary | ICD-10-CM | POA: Diagnosis not present

## 2017-01-16 DIAGNOSIS — M791 Myalgia: Secondary | ICD-10-CM | POA: Diagnosis not present

## 2017-01-17 DIAGNOSIS — Z91040 Latex allergy status: Secondary | ICD-10-CM | POA: Diagnosis not present

## 2017-01-17 DIAGNOSIS — F319 Bipolar disorder, unspecified: Secondary | ICD-10-CM | POA: Diagnosis not present

## 2017-01-17 DIAGNOSIS — R0789 Other chest pain: Secondary | ICD-10-CM | POA: Diagnosis not present

## 2017-01-17 DIAGNOSIS — Z91048 Other nonmedicinal substance allergy status: Secondary | ICD-10-CM | POA: Diagnosis not present

## 2017-01-17 DIAGNOSIS — Z794 Long term (current) use of insulin: Secondary | ICD-10-CM | POA: Diagnosis not present

## 2017-01-17 DIAGNOSIS — R51 Headache: Secondary | ICD-10-CM | POA: Diagnosis not present

## 2017-01-17 DIAGNOSIS — R079 Chest pain, unspecified: Secondary | ICD-10-CM | POA: Diagnosis not present

## 2017-01-17 DIAGNOSIS — E119 Type 2 diabetes mellitus without complications: Secondary | ICD-10-CM | POA: Diagnosis not present

## 2017-01-17 DIAGNOSIS — Z79899 Other long term (current) drug therapy: Secondary | ICD-10-CM | POA: Diagnosis not present

## 2017-01-17 DIAGNOSIS — Z791 Long term (current) use of non-steroidal anti-inflammatories (NSAID): Secondary | ICD-10-CM | POA: Diagnosis not present

## 2017-01-17 DIAGNOSIS — M791 Myalgia: Secondary | ICD-10-CM | POA: Diagnosis not present

## 2017-01-18 DIAGNOSIS — M25521 Pain in right elbow: Secondary | ICD-10-CM | POA: Diagnosis not present

## 2017-01-19 ENCOUNTER — Ambulatory Visit
Admission: RE | Admit: 2017-01-19 | Discharge: 2017-01-19 | Disposition: A | Discharge status: Home / Self Care | Payer: TRICARE For Life (TFL) | Source: Ambulatory Visit | Attending: Physician Assistant | Admitting: Physician Assistant

## 2017-01-19 DIAGNOSIS — N644 Mastodynia: Secondary | ICD-10-CM | POA: Diagnosis not present

## 2017-01-19 DIAGNOSIS — N63 Unspecified lump in unspecified breast: Secondary | ICD-10-CM

## 2017-01-19 DIAGNOSIS — G4730 Sleep apnea, unspecified: Secondary | ICD-10-CM | POA: Diagnosis not present

## 2017-01-19 DIAGNOSIS — M7711 Lateral epicondylitis, right elbow: Secondary | ICD-10-CM | POA: Diagnosis not present

## 2017-01-19 DIAGNOSIS — R922 Inconclusive mammogram: Secondary | ICD-10-CM | POA: Diagnosis not present

## 2017-01-19 DIAGNOSIS — H9392 Unspecified disorder of left ear: Secondary | ICD-10-CM | POA: Diagnosis not present

## 2017-01-19 DIAGNOSIS — R1310 Dysphagia, unspecified: Secondary | ICD-10-CM | POA: Diagnosis not present

## 2017-01-19 HISTORY — DX: Mastodynia: N64.4

## 2017-01-22 DIAGNOSIS — Z961 Presence of intraocular lens: Secondary | ICD-10-CM | POA: Diagnosis not present

## 2017-01-22 DIAGNOSIS — H538 Other visual disturbances: Secondary | ICD-10-CM | POA: Diagnosis not present

## 2017-01-22 DIAGNOSIS — E119 Type 2 diabetes mellitus without complications: Secondary | ICD-10-CM | POA: Diagnosis not present

## 2017-01-22 DIAGNOSIS — H04129 Dry eye syndrome of unspecified lacrimal gland: Secondary | ICD-10-CM | POA: Diagnosis not present

## 2017-01-29 ENCOUNTER — Encounter: Payer: Self-pay | Admitting: Physician Assistant

## 2017-01-29 ENCOUNTER — Ambulatory Visit (INDEPENDENT_AMBULATORY_CARE_PROVIDER_SITE_OTHER): Payer: Medicare Other | Admitting: Physician Assistant

## 2017-01-29 VITALS — BP 111/70 | HR 83 | Ht 64.0 in | Wt 139.0 lb

## 2017-01-29 DIAGNOSIS — R0789 Other chest pain: Secondary | ICD-10-CM | POA: Diagnosis not present

## 2017-01-29 DIAGNOSIS — G4710 Hypersomnia, unspecified: Secondary | ICD-10-CM | POA: Diagnosis not present

## 2017-01-29 DIAGNOSIS — R0602 Shortness of breath: Secondary | ICD-10-CM

## 2017-01-29 DIAGNOSIS — R9439 Abnormal result of other cardiovascular function study: Secondary | ICD-10-CM

## 2017-01-29 DIAGNOSIS — R531 Weakness: Secondary | ICD-10-CM

## 2017-01-29 DIAGNOSIS — R5383 Other fatigue: Secondary | ICD-10-CM

## 2017-01-29 DIAGNOSIS — R0683 Snoring: Secondary | ICD-10-CM | POA: Diagnosis not present

## 2017-01-29 NOTE — Patient Instructions (Signed)
Stress test spironmetry

## 2017-01-30 ENCOUNTER — Encounter: Payer: Self-pay | Admitting: Physician Assistant

## 2017-01-30 DIAGNOSIS — E559 Vitamin D deficiency, unspecified: Secondary | ICD-10-CM | POA: Insufficient documentation

## 2017-01-30 DIAGNOSIS — R790 Abnormal level of blood mineral: Secondary | ICD-10-CM | POA: Insufficient documentation

## 2017-01-30 LAB — COMPREHENSIVE METABOLIC PANEL
ALK PHOS: 68 U/L (ref 33–130)
ALT: 12 U/L (ref 6–29)
AST: 13 U/L (ref 10–35)
Albumin: 4 g/dL (ref 3.6–5.1)
BUN: 23 mg/dL (ref 7–25)
CO2: 30 mmol/L (ref 20–31)
Calcium: 9.4 mg/dL (ref 8.6–10.4)
Chloride: 100 mmol/L (ref 98–110)
Creat: 0.77 mg/dL (ref 0.50–1.05)
GLUCOSE: 192 mg/dL — AB (ref 65–99)
POTASSIUM: 4.3 mmol/L (ref 3.5–5.3)
Sodium: 136 mmol/L (ref 135–146)
Total Bilirubin: 0.5 mg/dL (ref 0.2–1.2)
Total Protein: 6.8 g/dL (ref 6.1–8.1)

## 2017-01-30 LAB — TSH: TSH: 2.01 mIU/L

## 2017-01-30 LAB — CBC
HCT: 40.3 % (ref 35.0–45.0)
Hemoglobin: 12.7 g/dL (ref 11.7–15.5)
MCH: 27.6 pg (ref 27.0–33.0)
MCHC: 31.5 g/dL — ABNORMAL LOW (ref 32.0–36.0)
MCV: 87.6 fL (ref 80.0–100.0)
MPV: 11.8 fL (ref 7.5–12.5)
PLATELETS: 294 10*3/uL (ref 140–400)
RBC: 4.6 MIL/uL (ref 3.80–5.10)
RDW: 15.8 % — AB (ref 11.0–15.0)
WBC: 6.5 10*3/uL (ref 3.8–10.8)

## 2017-01-30 LAB — FERRITIN: Ferritin: 9 ng/mL — ABNORMAL LOW (ref 10–232)

## 2017-01-30 LAB — VITAMIN D 25 HYDROXY (VIT D DEFICIENCY, FRACTURES): VIT D 25 HYDROXY: 25 ng/mL — AB (ref 30–100)

## 2017-01-30 LAB — SEDIMENTATION RATE: Sed Rate: 5 mm/hr (ref 0–30)

## 2017-01-30 LAB — ANA: Anti Nuclear Antibody(ANA): NEGATIVE

## 2017-01-30 LAB — VITAMIN B12: VITAMIN B 12: 977 pg/mL (ref 200–1100)

## 2017-01-30 LAB — C-REACTIVE PROTEIN: CRP: 2.1 mg/L (ref <8.0)

## 2017-01-30 NOTE — Progress Notes (Signed)
Call pt: your ANA is autoimmune marker and negative. Your rate of inflammation is good. None of your blood work suggest lupus and/or other autoimmune diseases.  Thyroid normal range.  B12 great.  Vitamin d low and iron stores low.  Start D3 2000 units daily and ferrous sulfate 325mg  with breakfast. Recheck in 2 months.

## 2017-01-31 ENCOUNTER — Other Ambulatory Visit: Payer: Self-pay

## 2017-01-31 DIAGNOSIS — M25572 Pain in left ankle and joints of left foot: Secondary | ICD-10-CM | POA: Diagnosis not present

## 2017-02-02 DIAGNOSIS — R531 Weakness: Secondary | ICD-10-CM | POA: Insufficient documentation

## 2017-02-02 DIAGNOSIS — R0602 Shortness of breath: Secondary | ICD-10-CM | POA: Insufficient documentation

## 2017-02-02 DIAGNOSIS — A048 Other specified bacterial intestinal infections: Secondary | ICD-10-CM | POA: Diagnosis not present

## 2017-02-02 DIAGNOSIS — R5383 Other fatigue: Secondary | ICD-10-CM | POA: Insufficient documentation

## 2017-02-02 DIAGNOSIS — R1013 Epigastric pain: Secondary | ICD-10-CM | POA: Diagnosis not present

## 2017-02-02 DIAGNOSIS — R1310 Dysphagia, unspecified: Secondary | ICD-10-CM | POA: Diagnosis not present

## 2017-02-02 DIAGNOSIS — K5909 Other constipation: Secondary | ICD-10-CM | POA: Diagnosis not present

## 2017-02-02 DIAGNOSIS — R0789 Other chest pain: Secondary | ICD-10-CM | POA: Insufficient documentation

## 2017-02-02 NOTE — Progress Notes (Signed)
   Subjective:    Patient ID: Sheryl Matthews, female    DOB: [birth year], 51 y.o.   MRN: [id number]  HPI Pt presents to the clinic to discuss further testings for autiimmune diseases. Pt is concerned about lupus. She states maternal aunt has lupus and they think her identical twin has it as well. She has been to [facility] and worked up but was negative. She just feels tired, SOB, weak, "like something sitting on her chest". She just feels like something has to be wrong.    Review of Systems See HPI.     Objective:   Physical Exam  Constitutional: She appears well-developed and well-nourished.  HENT:  Head: Normocephalic and atraumatic.  Neck: Normal range of motion. Neck supple.  Cardiovascular: Normal rate, regular rhythm and normal heart sounds.   Pulmonary/Chest: Effort normal and breath sounds normal.  Psychiatric: She has a normal mood and affect. Her behavior is normal.          Assessment & Plan:  Marland KitchenMarland KitchenDiagnoses and all orders for this visit:  SOB (shortness of breath) -     CBC -     Comprehensive metabolic panel -     C-reactive protein -     Ferritin -     Sedimentation rate -     TSH -     Vitamin B12 -     Vit D  25 hydroxy (rtn osteoporosis monitoring) -     Antinuclear Antib (ANA)  No energy -     CBC -     Comprehensive metabolic panel -     C-reactive protein -     Ferritin -     Sedimentation rate -     TSH -     Vitamin B12 -     Vit D  25 hydroxy (rtn osteoporosis monitoring) -     Antinuclear Antib (ANA)  Chest tightness -     CBC -     Comprehensive metabolic panel -     C-reactive protein -     Ferritin -     Sedimentation rate -     TSH -     Vitamin B12 -     Vit D  25 hydroxy (rtn osteoporosis monitoring) -     Antinuclear Antib (ANA)  Weakness -     CBC -     Comprehensive metabolic panel -     C-reactive protein -     Ferritin -     Sedimentation rate -     TSH -     Vitamin B12 -     Vit D  25 hydroxy (rtn  osteoporosis monitoring) -     Antinuclear Antib (ANA)   If testing is negative needs to follow up with rheumatology.  For SOb come in for spirometry to further characterize.  For chest tightness will get stress test.

## 2017-02-05 DIAGNOSIS — G5601 Carpal tunnel syndrome, right upper limb: Secondary | ICD-10-CM | POA: Diagnosis not present

## 2017-02-05 DIAGNOSIS — M47812 Spondylosis without myelopathy or radiculopathy, cervical region: Secondary | ICD-10-CM | POA: Diagnosis not present

## 2017-02-07 DIAGNOSIS — M5030 Other cervical disc degeneration, unspecified cervical region: Secondary | ICD-10-CM | POA: Diagnosis not present

## 2017-02-07 DIAGNOSIS — M791 Myalgia: Secondary | ICD-10-CM | POA: Diagnosis not present

## 2017-02-07 DIAGNOSIS — M542 Cervicalgia: Secondary | ICD-10-CM | POA: Diagnosis not present

## 2017-02-08 ENCOUNTER — Ambulatory Visit (INDEPENDENT_AMBULATORY_CARE_PROVIDER_SITE_OTHER): Payer: Medicare Other

## 2017-02-08 DIAGNOSIS — R0602 Shortness of breath: Secondary | ICD-10-CM

## 2017-02-08 DIAGNOSIS — R531 Weakness: Secondary | ICD-10-CM | POA: Diagnosis not present

## 2017-02-08 DIAGNOSIS — R0789 Other chest pain: Secondary | ICD-10-CM

## 2017-02-08 LAB — EXERCISE TOLERANCE TEST
CHL CUP RESTING HR STRESS: 82 {beats}/min
CHL CUP STRESS STAGE 1 GRADE: 0 %
CHL CUP STRESS STAGE 1 HR: 83 {beats}/min
CHL CUP STRESS STAGE 1 SBP: 129 mmHg
CHL CUP STRESS STAGE 1 SPEED: 0 mph
CHL CUP STRESS STAGE 2 GRADE: 0 %
CHL CUP STRESS STAGE 2 HR: 89 {beats}/min
CHL CUP STRESS STAGE 2 SPEED: 1 mph
CHL CUP STRESS STAGE 4 GRADE: 10 %
CHL CUP STRESS STAGE 5 GRADE: 12 %
CHL CUP STRESS STAGE 5 SBP: 132 mmHg
CHL CUP STRESS STAGE 5 SPEED: 2.5 mph
CHL CUP STRESS STAGE 6 GRADE: 14 %
CHL CUP STRESS STAGE 6 HR: 114 {beats}/min
CHL CUP STRESS STAGE 6 SPEED: 3.4 mph
CHL CUP STRESS STAGE 7 DBP: 59 mmHg
CHL CUP STRESS STAGE 7 HR: 107 {beats}/min
CHL CUP STRESS STAGE 7 SBP: 143 mmHg
CHL RATE OF PERCEIVED EXERTION: 14
CSEPED: 6 min
CSEPEDS: 6 s
CSEPEW: 7.1 METS
CSEPHR: 69 %
CSEPPMHR: 67 %
MPHR: 169 {beats}/min
Peak HR: 114 {beats}/min
Stage 1 DBP: 72 mmHg
Stage 3 Grade: 0 %
Stage 3 HR: 90 {beats}/min
Stage 3 Speed: 1 mph
Stage 4 DBP: 60 mmHg
Stage 4 HR: 107 {beats}/min
Stage 4 SBP: 122 mmHg
Stage 4 Speed: 1.7 mph
Stage 5 DBP: 63 mmHg
Stage 5 HR: 115 {beats}/min
Stage 7 Grade: 0 %
Stage 7 Speed: 0 mph
Stage 8 DBP: 80 mmHg
Stage 8 Grade: 0 %
Stage 8 HR: 83 {beats}/min
Stage 8 SBP: 117 mmHg
Stage 8 Speed: 0 mph

## 2017-02-09 NOTE — Addendum Note (Signed)
Addended by: Donella Stade on: 02/09/2017 04:17 PM   Modules accepted: Orders

## 2017-02-09 NOTE — Progress Notes (Signed)
Call pt: stress test was inconclusive and recommended to follow up with lexiscan myoview. I ordered this and should be called in near future.

## 2017-02-12 DIAGNOSIS — R0683 Snoring: Secondary | ICD-10-CM | POA: Diagnosis not present

## 2017-02-12 DIAGNOSIS — R5383 Other fatigue: Secondary | ICD-10-CM | POA: Diagnosis not present

## 2017-02-12 DIAGNOSIS — G4733 Obstructive sleep apnea (adult) (pediatric): Secondary | ICD-10-CM | POA: Diagnosis not present

## 2017-02-13 ENCOUNTER — Telehealth: Payer: Self-pay | Admitting: Endocrinology

## 2017-02-13 ENCOUNTER — Other Ambulatory Visit: Payer: Self-pay

## 2017-02-13 DIAGNOSIS — E1065 Type 1 diabetes mellitus with hyperglycemia: Secondary | ICD-10-CM

## 2017-02-13 MED ORDER — INSULIN ASPART 100 UNIT/ML FLEXPEN: PEN_INJECTOR | SUBCUTANEOUS | 1 refills | Status: DC

## 2017-02-13 NOTE — Telephone Encounter (Signed)
This has been ordered 

## 2017-02-13 NOTE — Telephone Encounter (Signed)
Pt called in and requested that she get her Novolog Flex Pen refilled, she needs 4 boxes sent in to [facility].

## 2017-02-15 ENCOUNTER — Ambulatory Visit (HOSPITAL_COMMUNITY): Payer: Medicare Other | Attending: Cardiovascular Disease

## 2017-02-15 VITALS — Ht 62.0 in | Wt 139.0 lb

## 2017-02-15 DIAGNOSIS — R079 Chest pain, unspecified: Secondary | ICD-10-CM | POA: Diagnosis not present

## 2017-02-15 DIAGNOSIS — R9439 Abnormal result of other cardiovascular function study: Secondary | ICD-10-CM

## 2017-02-15 DIAGNOSIS — R0789 Other chest pain: Secondary | ICD-10-CM | POA: Diagnosis not present

## 2017-02-15 DIAGNOSIS — R5383 Other fatigue: Secondary | ICD-10-CM

## 2017-02-15 DIAGNOSIS — R0602 Shortness of breath: Secondary | ICD-10-CM | POA: Diagnosis not present

## 2017-02-15 DIAGNOSIS — R531 Weakness: Secondary | ICD-10-CM | POA: Diagnosis not present

## 2017-02-15 LAB — MYOCARDIAL PERFUSION IMAGING
CHL CUP NUCLEAR SRS: 0
CHL CUP RESTING HR STRESS: 75 {beats}/min
CSEPPHR: 104 {beats}/min
LVDIAVOL: 71 mL (ref 46–106)
LVSYSVOL: 28 mL
NUC STRESS TID: 0.96
RATE: 0.26
SDS: 1
SSS: 1

## 2017-02-15 MED ORDER — REGADENOSON 0.4 MG/5ML IV SOLN
0.4000 mg | Freq: Once | INTRAVENOUS | Status: AC
  Administered 2017-02-15: 0.4 mg via INTRAVENOUS

## 2017-02-15 MED ORDER — TECHNETIUM TC 99M TETROFOSMIN IV KIT
10.9000 | PACK | Freq: Once | INTRAVENOUS | Status: AC | PRN
  Administered 2017-02-15: 10.9 via INTRAVENOUS
  Filled 2017-02-15: qty 11

## 2017-02-15 MED ORDER — TECHNETIUM TC 99M TETROFOSMIN IV KIT
31.6000 | PACK | Freq: Once | INTRAVENOUS | Status: AC | PRN
  Administered 2017-02-15: 31.6 via INTRAVENOUS
  Filled 2017-02-15: qty 32

## 2017-02-16 ENCOUNTER — Ambulatory Visit: Payer: Self-pay | Admitting: Endocrinology

## 2017-02-16 DIAGNOSIS — M25572 Pain in left ankle and joints of left foot: Secondary | ICD-10-CM | POA: Diagnosis not present

## 2017-02-16 NOTE — Progress Notes (Signed)
Call pt: nuclear stress test was normal. No ischemia or infarction. EF good.

## 2017-02-26 DIAGNOSIS — M150 Primary generalized (osteo)arthritis: Secondary | ICD-10-CM | POA: Diagnosis not present

## 2017-02-26 DIAGNOSIS — M797 Fibromyalgia: Secondary | ICD-10-CM | POA: Diagnosis not present

## 2017-03-01 DIAGNOSIS — K760 Fatty (change of) liver, not elsewhere classified: Secondary | ICD-10-CM | POA: Diagnosis not present

## 2017-03-01 DIAGNOSIS — R1013 Epigastric pain: Secondary | ICD-10-CM | POA: Diagnosis not present

## 2017-03-06 ENCOUNTER — Ambulatory Visit: Payer: Self-pay | Admitting: Endocrinology

## 2017-03-08 ENCOUNTER — Ambulatory Visit (HOSPITAL_COMMUNITY): Payer: Self-pay | Admitting: Psychiatry

## 2017-03-22 ENCOUNTER — Encounter (HOSPITAL_COMMUNITY): Payer: Self-pay | Admitting: Psychiatry

## 2017-03-22 ENCOUNTER — Ambulatory Visit (INDEPENDENT_AMBULATORY_CARE_PROVIDER_SITE_OTHER): Payer: Medicare Other | Admitting: Psychiatry

## 2017-03-22 VITALS — BP 122/70 | HR 86 | Resp 16 | Ht 64.0 in | Wt 137.0 lb

## 2017-03-22 DIAGNOSIS — F5102 Adjustment insomnia: Secondary | ICD-10-CM

## 2017-03-22 DIAGNOSIS — Z79899 Other long term (current) drug therapy: Secondary | ICD-10-CM

## 2017-03-22 DIAGNOSIS — F411 Generalized anxiety disorder: Secondary | ICD-10-CM | POA: Diagnosis not present

## 2017-03-22 DIAGNOSIS — F3130 Bipolar disorder, current episode depressed, mild or moderate severity, unspecified: Secondary | ICD-10-CM

## 2017-03-22 DIAGNOSIS — Z794 Long term (current) use of insulin: Secondary | ICD-10-CM | POA: Diagnosis not present

## 2017-03-22 MED ORDER — BUPROPION HCL ER (XL) 150 MG PO TB24: 150.0000 mg | ORAL_TABLET | Freq: Every day | ORAL | 0 refills | Status: DC

## 2017-03-22 MED ORDER — FLUOXETINE HCL 20 MG PO CAPS: 60.0000 mg | ORAL_CAPSULE | Freq: Every day | ORAL | 0 refills | Status: DC

## 2017-03-22 NOTE — Progress Notes (Signed)
[facility] [phone] Progress Note  Sheryl Matthews [id number] 51 y.o.  03/22/2017 4:06 PM  Chief Complaint:  Bipolar disorder MRE depressed History of Present Illness:  Patient has been diagnosed with bipolar, returns for follow up.  she is on Wellbutrin Prozac.  She is doing reasonable she went to visit her daughter that is good. Husband is supportive. Mood is not fluctuating tolerating Wellbutrin and Prozac does not endorse significant anxiety.  Duration of more than 10 years. Negative factors is poor sleep and stress at times modifying factors :supportive husband  Insomnia: variable depending upon stress.   Denies drug use or ABUSE of alcohol as of now     Past Medical Family, Social History: Husband is finishing his deployment . She is glad that he is home  Outpatient Encounter Prescriptions as of 03/22/2017  Medication Sig  . Alcohol Swabs PADS Use as instructed  . ammonium lactate (LAC-HYDRIN TWELVE) 12 % lotion Apply 1 application topically as needed for dry skin.  Marland Kitchen buPROPion (WELLBUTRIN XL) 150 MG 24 hr tablet Take 1 tablet (150 mg total) by mouth daily.  . Emollient (VANICREAM EX) Apply topically as needed.  Marland Kitchen FLUoxetine (PROZAC) 20 MG capsule Take 3 capsules (60 mg total) by mouth daily.  Marland Kitchen FREESTYLE LITE test strip USE AS INSTRUCTED TO CHECK BLOOD SUGAR SIX TIMES PER DAY  . gabapentin (NEURONTIN) 300 MG capsule TAKE 1 CAPSULE DAILY.  Marland Kitchen glucagon 1 MG injection Inject 1 mg intramuscularly for severe hypoglycemia  . insulin aspart (NOVOLOG FLEXPEN) 100 UNIT/ML FlexPen Inject 4-6 units three times a day with meals  . Insulin Pen Needle (BD PEN NEEDLE NANO U/F) 32G X 4 MM MISC Use 7 per day to inject insulin  . Insulin Syringe-Needle U-100 (INSULIN SYRINGE .5CC/31GX5/16") 31G X 5/16" 0.5 ML MISC Use to inject insulin 4 times per day  . KETOSTIX strip USE TO CHECK URINE KETONES IF BLOOD SUGAR IS OVER 300 AS DIRECTED BY PHYSICIAN  . LANTUS SOLOSTAR 100  UNIT/ML Solostar Pen INJECT 20 UNITS UNDER THE SKIN TWICE A DAY  . Multiple Vitamins-Minerals (HAIR VITAMINS PO) Take by mouth daily.  . naproxen (NAPROSYN) 500 MG tablet TAKE 1 TABLET DAILY  . NEXIUM 40 MG capsule TAKE 1 CAPSULE DAILY AT 12 NOON  . Probiotic Product (PROBIOTIC DAILY PO) Take by mouth.  . [DISCONTINUED] ALPRAZolam (XANAX) 0.25 MG tablet Take 1 tablet (0.25 mg total) by mouth as needed for anxiety.  . [DISCONTINUED] buPROPion (WELLBUTRIN XL) 150 MG 24 hr tablet Take 1 tablet (150 mg total) by mouth daily.  . [DISCONTINUED] FLUoxetine (PROZAC) 20 MG capsule Take 3 capsules (60 mg total) by mouth daily.   No facility-administered encounter medications on file as of 03/22/2017.      Physical Exam: Constitutional:  BP 122/70 (BP Location: Right Arm, Patient Position: Sitting, Cuff Size: Normal)   Pulse 86   Resp 16   Ht 5\' 4"  (1.626 m)   Wt 137 lb (62.1 kg)   SpO2 98%   BMI 23.52 kg/m   General Appearance: alert, oriented, no acute distress ROS: no rash. Depression fluctutates. No chest pain    Psychiatric: Speech (describe rate, volume, coherence, spontaneity, and abnormalities if any): clear and goal directed, normal rate volume and rhythm  Thought Process (describe rate, content, abstract reasoning, and computation): normal  Associations: Coherent and Relevant  Thoughts: normal  Mental Status: Orientation: oriented to person, place and time/date Mood & Affect:euthymic, reactive Attention Span &  Concentration: good No psychotic symptoms. Alert and oriented with reasonable judjement.     Assessment: Axis I: Bipolar MRE depressed, GAD. insomnia  Plan:  Bipolar: stable. Is on gabapentin which is helping as mood stabilizer as well   Depression: fair. Not worse. Continue wellbutrin and prozac  Generalized anxiety disorder;  Improved. Continue prozac 60mg  Dc xanax  Insomnia: baseline  Reviewed prescriptions for respiratory therapy follow-up in 3-4  months. She is not taking Xanax anymore. Merian Capron, MD 03/22/2017

## 2017-03-26 ENCOUNTER — Ambulatory Visit (INDEPENDENT_AMBULATORY_CARE_PROVIDER_SITE_OTHER): Payer: Medicare Other | Admitting: Endocrinology

## 2017-03-26 ENCOUNTER — Encounter: Payer: Self-pay | Admitting: Endocrinology

## 2017-03-26 VITALS — BP 118/68 | HR 84 | Ht 64.0 in | Wt 139.2 lb

## 2017-03-26 DIAGNOSIS — E1065 Type 1 diabetes mellitus with hyperglycemia: Secondary | ICD-10-CM

## 2017-03-26 NOTE — Patient Instructions (Signed)
Take lantus in am same time daily

## 2017-03-26 NOTE — Progress Notes (Signed)
Patient ID: Sheryl Matthews, female   DOB: 1966-01-13, 51 y.o.   MRN: [id number]          Reason for Appointment : Follow-up for Type 1 Diabetes  History of Present Illness          Diagnosis: Type 1 diabetes mellitus, date of diagnosis:  at age 71    Previous history:   She has been on insulin since the time of diagnosis. She was on the same program of NPH and Regular Insulin since diagnosis was made when she was first seen here   Recent history:   INSULIN regimen is described as:  Lantus 8 U bid, Novolog 6-7 ac. 1:50 correction.  Her A1c is still relatively high at 7.9, previously 8.2  Although she started doing her continuous glucose monitoring the DexCom in December she is only now coming back for follow-up  Glucose patterns, management and problems identified:    Her blood sugars overall are showing extreme variability, see interpretation of CGM below  She says that most of her high readings were related to her not staying at home and with family and he was not watching her diet well  However more recently she has been physically more active especially in the afternoon which is causing tendency to low blood sugars  Because of tendency to low sugars at times or fear of hypoglycemia she usually is over treating this and also cutting back on her insulin doses  Not clear if she is skipping her Novolog when blood sugars are low normal but she may be delaying her Lantus in the morning when sugars are low normal    FASTING blood sugars are generally high and blood sugars are high overnight also as discussed below, however more recently she may have lower readings especially if she has been active the day before  Postprandial readings are difficult to assess as she has variable meal times and blood sugars show no consistent pattern  Most consistent pattern appears to be tendency to lower her blood sugars around early afternoon  She may cut back on her insulin at lunchtime before  she is going out to be active but she is not able to prevent hypoglycemia consistently  Also for treatment of hypoglycemia she is eating peanut butter along with drinking milk   Glucose monitoring:      Glucometer:  contour  .      Blood Glucose readings from meter download   Mean values apply above for all meters except median for One Touch  PRE-MEAL Fasting Lunch Dinner After 6 PM  Overall  Glucose range:  1 88-479       Mean/median:    231  270     CONTINUOUS GLUCOSE MONITORING RECORD INTERPRETATION    Dates of Recording: 4/17-4/30/80  Sensor  summary:  Average blood glucose for the period of recording is 213 with standard deviation 97.  They are within the target only 35% of the time Blood sugars are over 250 about 38% of the time, below 70 about 6% of the time and below 54 about 3% of the time  Blood sugars are showing patterns of both nighttime and daytime highs with her best blood sugar day being on 4/19 with 72% her readings within target range but going excessively high after 7 PM until midnight  Hypoglycemia as discussed ago      Glycemic patterns:  Hyperglycemic episodes  Are occurring mostly through the night and morning hours and some around 6  PM as well as after 10 PM Hypoglycemic episodes occurred mostly around 1-2 PM She tends to have excessive rebound after low sugars      Overnight periods:  Blood sugars are on an average running around 220-250 through the night with marked variability with blood sugars occasionally over 350 but also near 100 at the extreme.  Lowest blood sugars are around 4 AM and highest around midnight-2 AM She did have tendency to hypoglycemia on the night of April 23 and this occurred twice Also tend to be rising generally between 7 AM-9 AM showing Dawn phenomenon      Preprandial periods:  Blood sugars are consistently high around breakfast time which is variable Blood sugars around lunchtime which is about noon are averaging below 150 and  again higher at suppertime averaging around 250      Postprandial periods:  Difficult to assess as meal times are not marked There is some increase in blood sugar between 8-9 AM and also late in the evening after 9 PM Blood sugars are generally much lower fairly consistently around 1-2 PM with average about 110 at the lowest level      Hypoglycemia: This is relatively mild with tendency to low sugars between 1-2 PM and rarely around 9 PM         Hypoglycemia:  Factors causing hyperglycemia: Reduced food intake, exercise Symptoms of hypoglycemia: Weakness, dizziness, difficulty thinking, feels sweaty and shaky only if blood sugar in the 30s.  May recognize symptoms low 60 at times but inconsistently.  Has had seizures during the night from hypoglycemia Treatment of hypoglycemia: juice, peanut butter, Glucagon      Diabetes labs:  Lab Results  Component Value Date   HGBA1C 7.9 03/27/2017   HGBA1C 8.2 (H) 10/06/2016   HGBA1C 8.1 (H) 06/16/2016   Lab Results  Component Value Date   MICROALBUR <0.7 10/06/2016   [place] 93 10/06/2016   CREATININE 0.77 01/29/2017    Allergies as of 03/26/2017      Reactions   Latex Rash   Tape Rash   Lyrica [pregabalin]    Did not have benefit.  Falling asleep. Feeling depressed.      Medication List       Accurate as of 03/26/17 11:59 PM. Always use your most recent med list.          Alcohol Swabs Pads Use as instructed   ammonium lactate 12 % lotion Commonly known as:  LAC-HYDRIN TWELVE Apply 1 application topically as needed for dry skin.   buPROPion 150 MG 24 hr tablet Commonly known as:  WELLBUTRIN XL Take 1 tablet (150 mg total) by mouth daily.   FLUoxetine 20 MG capsule Commonly known as:  PROZAC Take 3 capsules (60 mg total) by mouth daily.   FREESTYLE LITE test strip Generic drug:  glucose blood USE AS INSTRUCTED TO CHECK BLOOD SUGAR SIX TIMES PER DAY   gabapentin 300 MG capsule Commonly known as:   NEURONTIN TAKE 1 CAPSULE DAILY.   glucagon 1 MG injection Inject 1 mg intramuscularly for severe hypoglycemia   HAIR VITAMINS PO Take by mouth daily.   insulin aspart 100 UNIT/ML FlexPen Commonly known as:  NOVOLOG FLEXPEN Inject 4-6 units three times a day with meals   Insulin Pen Needle 32G X 4 MM Misc Commonly known as:  BD PEN NEEDLE NANO U/F Use 7 per day to inject insulin   INSULIN SYRINGE .5CC/31GX5/16" 31G X 5/16" 0.5 ML Misc Use to inject insulin  4 times per day   KETOSTIX strip Generic drug:  acetone (urine) test USE TO CHECK URINE KETONES IF BLOOD SUGAR IS OVER 300 AS DIRECTED BY PHYSICIAN   LANTUS SOLOSTAR 100 UNIT/ML Solostar Pen Generic drug:  Insulin Glargine INJECT 20 UNITS UNDER THE SKIN TWICE A DAY   naproxen 500 MG tablet Commonly known as:  NAPROSYN TAKE 1 TABLET DAILY   NEXIUM 40 MG capsule Generic drug:  esomeprazole TAKE 1 CAPSULE DAILY AT 12 NOON   pantoprazole 40 MG tablet Commonly known as:  PROTONIX Take 40 mg by mouth daily.   PROBIOTIC DAILY PO Take by mouth.   VANICREAM EX Apply topically as needed.       Allergies:  Allergies  Allergen Reactions  . Latex Rash  . Tape Rash  . Lyrica [Pregabalin]     Did not have benefit.  Falling asleep. Feeling depressed.    Past Medical History:  Diagnosis Date  . Depression   . Diabetes mellitus without complication ([place])   . Pain of left breast     No past surgical history on file.  Family History  Problem Relation Age of Onset  . Depression Sister   . Alcohol abuse Brother   . Diabetes Brother     Social History:  reports that she has never smoked. She has never used smokeless tobacco. She reports that she does not drink alcohol or use drugs.    Review of Systems   ROS      Lipids: Not on any statins  Lab Results  Component Value Date   CHOL 191 10/06/2016   HDL 86.80 10/06/2016   LDLCALC 93 10/06/2016   TRIG 52.0 10/06/2016   CHOLHDL 2 10/06/2016      She  Has had nausea and abdominal discomfort, not seeing locally recently  Depression: She is now taking Wellbutrin and Prozac       She has a history of Numbness and, tingling in her feet for about a year.       Physical Examination:  BP 118/68   Pulse 84   Ht 5\' 4"  (1.626 m)   Wt 139 lb 3.2 oz (63.1 kg)   SpO2 98%   BMI 23.89 kg/m     ASSESSMENT:  Diabetes type 1, long-standing and poorly controlled See history of present illness for detailed discussion of current diabetes management, blood sugar patterns and problems identified  A1c is still high at 7.9 Her blood sugars are overall poorly controlled recently and this is partly related to inconsistent diet, stress and cutting back on insulin doses excessively for fear of hypoglycemia This is despite her starting the DexCom monitor. She has tendency to hypoglycemia in the early afternoon but overnight blood sugars are variable and may also have tendency to delayed hyperglycemia the next morning Postprandial readings are variable Some of the hyperglycemia is also limited to rebound from hypoglycemia as well as some unbalanced meals and stress  PLAN:   No change in overall insulin dosage as yet  She needs to start taking her Lantus consistently at the same time especially in the morning even when her blood sugars are low normal  She will need to reduce her NovoLog dose by 50-75 percent when planning to be very physically active especially after lunch.  She can reduce her evening Lantus to 6 units if she has been very active during the day  Make sure she will have protein with breakfast daily  Again, reminded her to make sure  she takes her NovoLog with or before the meal regardless of blood sugar even if it is low normal  She does need to use mostly simple sugars or carbohydrates when she has on a downward trend in her blood sugars are low normal sugars with physical activity and avoid high-fat foods like peanut  butter  Consider reducing carbohydrate coverage calculation and correction factor at lunchtime also if she is planning to be more active  Since her blood sugar patterns appear to be somewhat different in the last few days with some lifestyle changes will need to reassess how her blood sugar patterns are in another couple of weeks, she will see the nurse educator for this  Follow-up in 6 weeks   Patient Instructions  Take lantus in am same time daily    Counseling time on subjects discussed above is over 50% of today's 25 minute visit    [place] 03/27/2017, 9:04 AM   Note: This note was prepared with Dragon voice recognition system technology. Any transcriptional errors that result from this process are unintentional.

## 2017-03-27 LAB — POCT GLYCOSYLATED HEMOGLOBIN (HGB A1C): HEMOGLOBIN A1C: 7.9

## 2017-04-03 DIAGNOSIS — G4733 Obstructive sleep apnea (adult) (pediatric): Secondary | ICD-10-CM | POA: Diagnosis not present

## 2017-04-05 ENCOUNTER — Encounter: Payer: Self-pay | Admitting: Physician Assistant

## 2017-04-05 DIAGNOSIS — R1310 Dysphagia, unspecified: Secondary | ICD-10-CM | POA: Diagnosis not present

## 2017-04-05 DIAGNOSIS — K2950 Unspecified chronic gastritis without bleeding: Secondary | ICD-10-CM | POA: Diagnosis not present

## 2017-04-05 DIAGNOSIS — K3189 Other diseases of stomach and duodenum: Secondary | ICD-10-CM | POA: Diagnosis not present

## 2017-04-05 DIAGNOSIS — K2970 Gastritis, unspecified, without bleeding: Secondary | ICD-10-CM | POA: Diagnosis not present

## 2017-04-05 DIAGNOSIS — K219 Gastro-esophageal reflux disease without esophagitis: Secondary | ICD-10-CM | POA: Diagnosis not present

## 2017-04-17 ENCOUNTER — Encounter: Payer: Self-pay | Admitting: Nutrition

## 2017-04-18 ENCOUNTER — Encounter: Payer: Self-pay | Admitting: Nutrition

## 2017-04-30 DIAGNOSIS — G5603 Carpal tunnel syndrome, bilateral upper limbs: Secondary | ICD-10-CM | POA: Diagnosis not present

## 2017-04-30 DIAGNOSIS — M47812 Spondylosis without myelopathy or radiculopathy, cervical region: Secondary | ICD-10-CM | POA: Diagnosis not present

## 2017-05-07 ENCOUNTER — Other Ambulatory Visit: Payer: Self-pay

## 2017-05-07 ENCOUNTER — Telehealth: Payer: Self-pay | Admitting: Endocrinology

## 2017-05-07 DIAGNOSIS — E1065 Type 1 diabetes mellitus with hyperglycemia: Secondary | ICD-10-CM

## 2017-05-07 MED ORDER — INSULIN ASPART 100 UNIT/ML FLEXPEN: PEN_INJECTOR | SUBCUTANEOUS | 1 refills | Status: DC

## 2017-05-07 MED ORDER — GLUCAGON (RDNA) 1 MG IJ KIT: PACK | INTRAMUSCULAR | 3 refills | Status: DC

## 2017-05-07 NOTE — Telephone Encounter (Signed)
Called patient and let her know that I have sent her refills to [facility] for her.

## 2017-05-07 NOTE — Telephone Encounter (Signed)
**  Remind patient they can make refill requests via MyChart**  Medication refill request (Name & Dosage):  insulin aspart (NOVOLOG FLEXPEN) 100 UNIT/ML FlexPen  4 Glucose Emergency Kit   Preferred pharmacy (Name & Address):  [place], [place] [phone] (Phone) [phone] (Fax)    Other comments (if applicable):   Call patient with any questions and to notify when the rx has been placed. Okay to leave a detailed message on phone.

## 2017-05-10 ENCOUNTER — Ambulatory Visit: Payer: Self-pay | Admitting: Endocrinology

## 2017-05-24 DIAGNOSIS — R1310 Dysphagia, unspecified: Secondary | ICD-10-CM | POA: Diagnosis not present

## 2017-05-24 DIAGNOSIS — K219 Gastro-esophageal reflux disease without esophagitis: Secondary | ICD-10-CM | POA: Diagnosis not present

## 2017-05-29 DIAGNOSIS — M25521 Pain in right elbow: Secondary | ICD-10-CM | POA: Diagnosis not present

## 2017-06-05 ENCOUNTER — Telehealth: Payer: Self-pay | Admitting: *Deleted

## 2017-06-05 DIAGNOSIS — M79642 Pain in left hand: Secondary | ICD-10-CM

## 2017-06-05 DIAGNOSIS — M79641 Pain in right hand: Secondary | ICD-10-CM

## 2017-06-05 NOTE — Telephone Encounter (Signed)
Patient called and left a message on vm that she would like a referral to the rheumatologist  Shelby Mattocks.,MD. Luvenia Starch is working from home.) patient was scheduled with rheumatologist at [facility] rheumatologist 10/26/2016 . Will place referral

## 2017-06-11 ENCOUNTER — Other Ambulatory Visit (INDEPENDENT_AMBULATORY_CARE_PROVIDER_SITE_OTHER): Payer: Medicare Other

## 2017-06-11 DIAGNOSIS — E1065 Type 1 diabetes mellitus with hyperglycemia: Secondary | ICD-10-CM

## 2017-06-11 LAB — GLUCOSE, RANDOM: Glucose, Bld: 75 mg/dL (ref 70–99)

## 2017-06-12 LAB — FRUCTOSAMINE: FRUCTOSAMINE: 340 umol/L — AB (ref 0–285)

## 2017-06-14 ENCOUNTER — Telehealth: Payer: Self-pay | Admitting: Endocrinology

## 2017-06-14 ENCOUNTER — Ambulatory Visit: Payer: Self-pay | Admitting: Endocrinology

## 2017-06-14 NOTE — Telephone Encounter (Signed)
Fructosamine diabetes test high at 340, normal up to 285.  She will need to come for A1c labs before her next visit next month

## 2017-06-14 NOTE — Telephone Encounter (Signed)
Patient called to get lab results. Call patient to advise.

## 2017-06-14 NOTE — Telephone Encounter (Signed)
I contacted the patient and advised of message. She voiced understanding but declined making an appointment for the lab at this time. She stated she will walk in.

## 2017-06-14 NOTE — Telephone Encounter (Signed)
Please advise if there is anything I need to tell her.

## 2017-06-15 ENCOUNTER — Ambulatory Visit: Payer: Self-pay | Admitting: Endocrinology

## 2017-06-15 DIAGNOSIS — E113293 Type 2 diabetes mellitus with mild nonproliferative diabetic retinopathy without macular edema, bilateral: Secondary | ICD-10-CM | POA: Diagnosis not present

## 2017-06-15 DIAGNOSIS — H5702 Anisocoria: Secondary | ICD-10-CM | POA: Diagnosis not present

## 2017-06-15 DIAGNOSIS — H40053 Ocular hypertension, bilateral: Secondary | ICD-10-CM | POA: Diagnosis not present

## 2017-06-15 DIAGNOSIS — H04123 Dry eye syndrome of bilateral lacrimal glands: Secondary | ICD-10-CM | POA: Diagnosis not present

## 2017-06-15 NOTE — Telephone Encounter (Signed)
error 

## 2017-06-16 DIAGNOSIS — H53149 Visual discomfort, unspecified: Secondary | ICD-10-CM | POA: Diagnosis not present

## 2017-06-16 DIAGNOSIS — R51 Headache: Secondary | ICD-10-CM | POA: Diagnosis not present

## 2017-06-16 DIAGNOSIS — M329 Systemic lupus erythematosus, unspecified: Secondary | ICD-10-CM | POA: Diagnosis not present

## 2017-06-16 DIAGNOSIS — Z91048 Other nonmedicinal substance allergy status: Secondary | ICD-10-CM | POA: Diagnosis not present

## 2017-06-16 DIAGNOSIS — R110 Nausea: Secondary | ICD-10-CM | POA: Diagnosis not present

## 2017-06-16 DIAGNOSIS — Z79899 Other long term (current) drug therapy: Secondary | ICD-10-CM | POA: Diagnosis not present

## 2017-06-16 DIAGNOSIS — F319 Bipolar disorder, unspecified: Secondary | ICD-10-CM | POA: Diagnosis not present

## 2017-06-16 DIAGNOSIS — Z791 Long term (current) use of non-steroidal anti-inflammatories (NSAID): Secondary | ICD-10-CM | POA: Diagnosis not present

## 2017-06-16 DIAGNOSIS — R42 Dizziness and giddiness: Secondary | ICD-10-CM | POA: Diagnosis not present

## 2017-06-16 DIAGNOSIS — E119 Type 2 diabetes mellitus without complications: Secondary | ICD-10-CM | POA: Diagnosis not present

## 2017-06-16 DIAGNOSIS — Z91040 Latex allergy status: Secondary | ICD-10-CM | POA: Diagnosis not present

## 2017-06-16 DIAGNOSIS — Z794 Long term (current) use of insulin: Secondary | ICD-10-CM | POA: Diagnosis not present

## 2017-06-25 DIAGNOSIS — R768 Other specified abnormal immunological findings in serum: Secondary | ICD-10-CM | POA: Diagnosis not present

## 2017-06-25 DIAGNOSIS — M47812 Spondylosis without myelopathy or radiculopathy, cervical region: Secondary | ICD-10-CM | POA: Diagnosis not present

## 2017-06-25 DIAGNOSIS — G5603 Carpal tunnel syndrome, bilateral upper limbs: Secondary | ICD-10-CM | POA: Diagnosis not present

## 2017-06-25 DIAGNOSIS — M797 Fibromyalgia: Secondary | ICD-10-CM | POA: Diagnosis not present

## 2017-06-25 DIAGNOSIS — D8989 Other specified disorders involving the immune mechanism, not elsewhere classified: Secondary | ICD-10-CM | POA: Diagnosis not present

## 2017-06-28 ENCOUNTER — Ambulatory Visit (HOSPITAL_COMMUNITY): Payer: Medicare Other | Admitting: Psychiatry

## 2017-07-17 ENCOUNTER — Other Ambulatory Visit: Payer: Self-pay

## 2017-07-20 ENCOUNTER — Ambulatory Visit: Payer: Self-pay | Admitting: Endocrinology

## 2017-07-24 DIAGNOSIS — E10618 Type 1 diabetes mellitus with other diabetic arthropathy: Secondary | ICD-10-CM | POA: Diagnosis not present

## 2017-07-24 DIAGNOSIS — K219 Gastro-esophageal reflux disease without esophagitis: Secondary | ICD-10-CM | POA: Diagnosis not present

## 2017-07-24 DIAGNOSIS — Z79899 Other long term (current) drug therapy: Secondary | ICD-10-CM | POA: Diagnosis not present

## 2017-07-24 DIAGNOSIS — M4712 Other spondylosis with myelopathy, cervical region: Secondary | ICD-10-CM | POA: Diagnosis not present

## 2017-07-24 DIAGNOSIS — M329 Systemic lupus erythematosus, unspecified: Secondary | ICD-10-CM | POA: Diagnosis not present

## 2017-07-24 DIAGNOSIS — G5601 Carpal tunnel syndrome, right upper limb: Secondary | ICD-10-CM | POA: Diagnosis not present

## 2017-07-24 DIAGNOSIS — M47812 Spondylosis without myelopathy or radiculopathy, cervical region: Secondary | ICD-10-CM | POA: Diagnosis not present

## 2017-07-24 DIAGNOSIS — Z7951 Long term (current) use of inhaled steroids: Secondary | ICD-10-CM | POA: Diagnosis not present

## 2017-07-24 DIAGNOSIS — G4733 Obstructive sleep apnea (adult) (pediatric): Secondary | ICD-10-CM | POA: Diagnosis not present

## 2017-07-24 DIAGNOSIS — Z91040 Latex allergy status: Secondary | ICD-10-CM | POA: Diagnosis not present

## 2017-07-24 DIAGNOSIS — Z794 Long term (current) use of insulin: Secondary | ICD-10-CM | POA: Diagnosis not present

## 2017-07-24 DIAGNOSIS — M4722 Other spondylosis with radiculopathy, cervical region: Secondary | ICD-10-CM | POA: Diagnosis not present

## 2017-07-24 DIAGNOSIS — F319 Bipolar disorder, unspecified: Secondary | ICD-10-CM | POA: Diagnosis not present

## 2017-07-24 DIAGNOSIS — M1990 Unspecified osteoarthritis, unspecified site: Secondary | ICD-10-CM | POA: Diagnosis not present

## 2017-07-24 DIAGNOSIS — Z91048 Other nonmedicinal substance allergy status: Secondary | ICD-10-CM | POA: Diagnosis not present

## 2017-07-25 ENCOUNTER — Ambulatory Visit: Payer: Self-pay | Admitting: Endocrinology

## 2017-07-25 DIAGNOSIS — K219 Gastro-esophageal reflux disease without esophagitis: Secondary | ICD-10-CM | POA: Diagnosis not present

## 2017-07-25 DIAGNOSIS — Z4789 Encounter for other orthopedic aftercare: Secondary | ICD-10-CM | POA: Diagnosis not present

## 2017-07-25 DIAGNOSIS — F319 Bipolar disorder, unspecified: Secondary | ICD-10-CM | POA: Diagnosis not present

## 2017-07-25 DIAGNOSIS — E10618 Type 1 diabetes mellitus with other diabetic arthropathy: Secondary | ICD-10-CM | POA: Diagnosis not present

## 2017-07-25 DIAGNOSIS — M4712 Other spondylosis with myelopathy, cervical region: Secondary | ICD-10-CM | POA: Diagnosis not present

## 2017-07-25 DIAGNOSIS — G5601 Carpal tunnel syndrome, right upper limb: Secondary | ICD-10-CM | POA: Diagnosis not present

## 2017-07-25 DIAGNOSIS — M1990 Unspecified osteoarthritis, unspecified site: Secondary | ICD-10-CM | POA: Diagnosis not present

## 2017-07-25 DIAGNOSIS — Z981 Arthrodesis status: Secondary | ICD-10-CM | POA: Diagnosis not present

## 2017-08-21 ENCOUNTER — Other Ambulatory Visit: Payer: Self-pay | Admitting: Endocrinology

## 2017-08-21 DIAGNOSIS — E1065 Type 1 diabetes mellitus with hyperglycemia: Secondary | ICD-10-CM

## 2017-08-23 ENCOUNTER — Other Ambulatory Visit: Payer: Self-pay

## 2017-08-28 ENCOUNTER — Ambulatory Visit: Payer: Medicare Other

## 2017-08-29 ENCOUNTER — Ambulatory Visit: Payer: Self-pay | Admitting: Endocrinology

## 2017-08-31 ENCOUNTER — Encounter: Payer: Self-pay | Admitting: Physician Assistant

## 2017-08-31 ENCOUNTER — Ambulatory Visit (INDEPENDENT_AMBULATORY_CARE_PROVIDER_SITE_OTHER): Payer: Medicare Other | Admitting: Physician Assistant

## 2017-08-31 VITALS — BP 102/68 | HR 95 | Temp 98.5°F | Ht 62.0 in | Wt 144.0 lb

## 2017-08-31 DIAGNOSIS — K130 Diseases of lips: Secondary | ICD-10-CM

## 2017-08-31 DIAGNOSIS — Z23 Encounter for immunization: Secondary | ICD-10-CM

## 2017-08-31 NOTE — Progress Notes (Signed)
   Subjective:    Patient ID: Sheryl Matthews, female    DOB: 1966-05-05, 51 y.o.   MRN: [id number]  HPI  Pt is a 51 yo plesant female who presents to the clinic with a sore on lower lip for last 5 days. She denies any trauma. She feels like it hurts and denies any itching. She has used abreva with no luck. No fever, chill. She has never had fever blisters. She is concerned about having a cold sore and did have contact with someone with herpes.   . Active Ambulatory Problems    Diagnosis Date Noted  . Bipolar 1 disorder with moderate mania ([place]) 11/02/2014  . Mild nonproliferative diabetic retinopathy ([place]) 12/22/2014  . Pseudophakia of both eyes 12/22/2014  . Abdominal pain 06/07/2015  . Internal hemorrhoids 08/16/2015  . Uncontrolled type 1 diabetes mellitus with hyperglycemia ([place]) 09/15/2015  . HAV (hallux abducto valgus) 12/20/2015  . Adductovarus rotation of toe, acquired 12/20/2015  . Painful breasts 06/14/2016  . History of fibrocystic disease of breast 06/14/2016  . Left breast mass 07/12/2016  . Fibromyalgia 12/26/2016  . Vitamin D insufficiency 01/30/2017  . Low iron stores 01/30/2017  . SOB (shortness of breath) 02/02/2017  . No energy 02/02/2017  . Chest tightness 02/02/2017  . Weakness 02/02/2017   Resolved Ambulatory Problems    Diagnosis Date Noted  . 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 Depression   . Diabetes mellitus without complication ([place])   . Pain of left breast       Review of Systems  All other systems reviewed and are negative.      Objective:   Physical Exam  Constitutional: She is oriented to person, place, and time. She appears well-developed and well-nourished.  HENT:  Head: Normocephalic and atraumatic.  Mouth/Throat:    Cardiovascular: Normal rate, regular rhythm and normal heart sounds.   Neurological: She is alert and oriented to person, place,  and time.  Psychiatric: She has a normal mood and affect. Her behavior is normal.          Assessment & Plan:  Marland KitchenMarland KitchenPatti was seen today for lip sore.  Diagnoses and all orders for this visit:  Sore lip -     Herpes simplex virus culture  Need for influenza vaccination -     Flu Vaccine QUAD 6+ mos PF IM (Fluarix Quad PF)   swabbed to confirm not herpes. I do not feel like it looks like herpes. It appears like lip trauma from a bit. Discussed cool compresses and topical abx ointment. I did not see any signs of cellulitis either. Follow up if not improving or worsening. Tylenol for pain. Pt is uncontrolled DM and perhaps healing time is increased.

## 2017-09-02 ENCOUNTER — Encounter: Payer: Self-pay | Admitting: Physician Assistant

## 2017-09-03 LAB — HERPES SIMPLEX VIRUS CULTURE
MICRO NUMBER: 81110766
SPECIMEN QUALITY: ADEQUATE

## 2017-09-03 NOTE — Progress Notes (Signed)
Call pt: negative culture for HSV.

## 2017-09-07 DIAGNOSIS — M7711 Lateral epicondylitis, right elbow: Secondary | ICD-10-CM | POA: Diagnosis not present

## 2017-09-07 DIAGNOSIS — M797 Fibromyalgia: Secondary | ICD-10-CM | POA: Diagnosis not present

## 2017-09-07 DIAGNOSIS — Z981 Arthrodesis status: Secondary | ICD-10-CM | POA: Diagnosis not present

## 2017-09-07 DIAGNOSIS — Z4789 Encounter for other orthopedic aftercare: Secondary | ICD-10-CM | POA: Diagnosis not present

## 2017-09-07 DIAGNOSIS — Z48811 Encounter for surgical aftercare following surgery on the nervous system: Secondary | ICD-10-CM | POA: Diagnosis not present

## 2017-09-07 DIAGNOSIS — M47812 Spondylosis without myelopathy or radiculopathy, cervical region: Secondary | ICD-10-CM | POA: Diagnosis not present

## 2017-09-25 ENCOUNTER — Other Ambulatory Visit: Payer: Self-pay

## 2017-10-01 ENCOUNTER — Ambulatory Visit: Payer: Self-pay | Admitting: Endocrinology

## 2017-10-24 ENCOUNTER — Telehealth: Payer: Self-pay | Admitting: Physician Assistant

## 2017-10-24 ENCOUNTER — Other Ambulatory Visit (INDEPENDENT_AMBULATORY_CARE_PROVIDER_SITE_OTHER): Payer: Medicare Other

## 2017-10-24 DIAGNOSIS — E1065 Type 1 diabetes mellitus with hyperglycemia: Secondary | ICD-10-CM | POA: Diagnosis not present

## 2017-10-24 LAB — COMPREHENSIVE METABOLIC PANEL
ALBUMIN: 3.9 g/dL (ref 3.5–5.2)
ALT: 13 U/L (ref 0–35)
AST: 15 U/L (ref 0–37)
Alkaline Phosphatase: 81 U/L (ref 39–117)
BUN: 22 mg/dL (ref 6–23)
CHLORIDE: 104 meq/L (ref 96–112)
CO2: 28 meq/L (ref 19–32)
Calcium: 9.3 mg/dL (ref 8.4–10.5)
Creatinine, Ser: 0.71 mg/dL (ref 0.40–1.20)
GFR: 91.56 mL/min (ref >60.00)
Glucose, Bld: 172 mg/dL — ABNORMAL HIGH (ref 70–99)
POTASSIUM: 3.9 meq/L (ref 3.5–5.1)
SODIUM: 137 meq/L (ref 135–145)
Total Bilirubin: 0.4 mg/dL (ref 0.2–1.2)
Total Protein: 6.7 g/dL (ref 6.0–8.3)

## 2017-10-24 LAB — LIPID PANEL
CHOL/HDL RATIO: 3
CHOLESTEROL: 185 mg/dL (ref 0–200)
HDL: 69.8 mg/dL (ref >39.00)
LDL CALC: 103 mg/dL — AB (ref 0–99)
NonHDL: 115.1
Triglycerides: 61 mg/dL (ref 0.0–149.0)
VLDL: 12.2 mg/dL (ref 0.0–40.0)

## 2017-10-24 LAB — MICROALBUMIN / CREATININE URINE RATIO
Creatinine,U: 140.4 mg/dL
MICROALB UR: 1 mg/dL (ref 0.0–1.9)
MICROALB/CREAT RATIO: 0.7 mg/g (ref 0.0–30.0)

## 2017-10-24 LAB — HEMOGLOBIN A1C: HEMOGLOBIN A1C: 8.2 % — AB (ref 4.6–6.5)

## 2017-10-24 NOTE — Telephone Encounter (Signed)
Confirm patient has followed up with imaging for follow up on breast mass.

## 2017-10-24 NOTE — Telephone Encounter (Signed)
-----   Message from Donella Stade, [place] sent at 01/22/2017 12:17 PM EST ----- Follow up left breast mass.

## 2017-10-26 ENCOUNTER — Other Ambulatory Visit: Payer: Self-pay

## 2017-11-01 ENCOUNTER — Ambulatory Visit (INDEPENDENT_AMBULATORY_CARE_PROVIDER_SITE_OTHER): Payer: Medicare Other | Admitting: Endocrinology

## 2017-11-01 ENCOUNTER — Encounter: Payer: Self-pay | Admitting: Endocrinology

## 2017-11-01 VITALS — BP 122/74 | HR 88 | Ht 62.0 in | Wt 141.6 lb

## 2017-11-01 DIAGNOSIS — E1065 Type 1 diabetes mellitus with hyperglycemia: Secondary | ICD-10-CM | POA: Diagnosis not present

## 2017-11-01 NOTE — Progress Notes (Signed)
Patient ID: Sheryl Matthews, female   DOB: May 22, 1966, 51 y.o.   MRN: [id number]          Reason for Appointment : Follow-up for Type 1 Diabetes  History of Present Illness          Diagnosis: Type 1 diabetes mellitus, date of diagnosis:  at age 73    Previous history:   She has been on insulin since the time of diagnosis. She was on the same program of NPH and Regular Insulin since diagnosis was made when she was first seen here   Recent history:   INSULIN regimen is described as:  Lantus 8 U bid, Novolog 6-7 ac. 1:50 correction.  Her A1c is continuing to be high and is 8.2, similar to previous reading  She has been irregular with her follow-up  Glucose patterns, management and problems identified:    Her blood sugars are showing fairly similar patterns to previous times  The patient is more focused on the tendency to low blood sugars during the day which she thinks he usually when she is a little more active or doing errands; however she does not make any changes to her insulin regimen to prevent this  Although she thinks she knows how to count carbohydrates she is not basing her insulin dosage based on what she is eating  She is mostly taking 6 units for her meals  During the day she said that because of tendency to low sugars when she is busy she will eat irregularly and insulin regimen is sporadic also  Today patient took her insulin with 6 units of Novolog but did not have a proper meal and was trying to eat in the car on the way over and blood sugar went down to 50 in the office  NOVOLOG at suppertime is frequently missed because she is periodically low normal or low before suppertime  She will then take correction doses later at night which does not appear to be getting her blood sugars down  Correction doses: At night she may take a correction factor of as much is 1:25, she took 8 units recently when her blood sugar was 300, however she did not separate her correction  and mealtime boluses  Also for treatment of hypoglycemia she is eating mostly food and only sometimes orange juice, does not carry glucose tablets   Glucose monitoring:      patient did not bring her glucose monitor  CONTINUOUS GLUCOSE MONITORING RECORD INTERPRETATION with the DexCom G5 device    Dates of Recording: 11/6 through 10/15/2017, more recent blood sugars are not available on the download that was done today  Sensor  summary:  Average blood glucose for the period of recording is 203 with standard deviation  89 Time in range 63%, 31% high Low blood sugars 4% and significantly low 2%      Glycemic patterns:  Hyperglycemic episodes are occurring sporadically late morning and afternoon but mostly after about 7 PM  Hypoglycemic episodes occurred periodically in the afternoon, mostly around 3 PM      Overnight periods:  .  Blood sugars are markedly high overnight although quite variable and averaging about 250 at midnight and gradually coming down to an average of 180 at 6 AM, only occasionally blood sugars are below 150 before 6 AM and no hypoglycemia      Preprandial periods:  Blood sugars are variable but mildly increased around 150-180 before breakfast and lunch Blood sugars are more variable at  suppertime but between 1 80-240 around 6-7 PM when she is having dinner      Postprandial periods:   Post breakfast blood sugars on an average or the same as before eating but she has a tendency to blood sugars sometimes being low her green 10:30-11:30 AM; also blood sugars after lunch and to be low normal or low but also has a few significant high readings also Blood sugars are fairly significantly going up after evening meal on most occasions and persistently high late in the evening after midnight also      Hypoglycemia: This is occurring periodically between about 8 AM and 6 PM           Hypoglycemia:  Factors causing hyperglycemia: Reduced food intake, exercise, increased  activity  Symptoms of hypoglycemia: Weakness, dizziness, difficulty thinking, feels sweaty and shaky only if blood sugar in the 30s.  May recognize symptoms low 60 at times but inconsistently.  Has had seizures during the night from hypoglycemia Treatment of hypoglycemia: juice, peanut butter, Glucagon      Diabetes labs:  Lab Results  Component Value Date   HGBA1C 8.2 (H) 10/24/2017   HGBA1C 7.9 03/27/2017   HGBA1C 8.2 (H) 10/06/2016   Lab Results  Component Value Date   MICROALBUR 1.0 10/24/2017   LDLCALC 103 (H) 10/24/2017   CREATININE 0.71 10/24/2017    Allergies as of 11/01/2017      Reactions   Latex Rash   Tape Rash   Lyrica [pregabalin]    Did not have benefit.  Falling asleep. Feeling depressed.      Medication List        Accurate as of 11/01/17 10:56 AM. Always use your most recent med list.          Alcohol Swabs Pads Use as instructed   buPROPion 150 MG 24 hr tablet Commonly known as:  WELLBUTRIN XL Take 1 tablet (150 mg total) by mouth daily.   FLUoxetine 20 MG capsule Commonly known as:  PROZAC Take 3 capsules (60 mg total) by mouth daily.   FREESTYLE LITE test strip Generic drug:  glucose blood USE AS INSTRUCTED TO CHECK BLOOD SUGAR SIX TIMES PER DAY   gabapentin 300 MG capsule Commonly known as:  NEURONTIN TAKE 1 CAPSULE DAILY.   glucagon 1 MG injection Inject 1 mg intramuscularly for severe hypoglycemia   HAIR VITAMINS PO Take by mouth daily.   insulin aspart 100 UNIT/ML FlexPen Commonly known as:  NOVOLOG FLEXPEN Inject 6-7 units three times a day with meals   Insulin Pen Needle 32G X 4 MM Misc Commonly known as:  BD PEN NEEDLE NANO U/F Use 7 per day to inject insulin   KETOSTIX strip Generic drug:  acetone (urine) test USE TO CHECK URINE KETONES IF BLOOD SUGAR IS OVER 300 AS DIRECTED BY PHYSICIAN   LANTUS SOLOSTAR 100 UNIT/ML Solostar Pen Generic drug:  Insulin Glargine INJECT 20 UNITS UNDER THE SKIN TWICE A DAY     naproxen 500 MG tablet Commonly known as:  NAPROSYN TAKE 1 TABLET DAILY   pantoprazole 40 MG tablet Commonly known as:  PROTONIX Take 40 mg by mouth daily.   PROBIOTIC DAILY PO Take by mouth.   VANICREAM EX Apply topically as needed.       Allergies:  Allergies  Allergen Reactions  . Latex Rash  . Tape Rash  . Lyrica [Pregabalin]     Did not have benefit.  Falling asleep. Feeling depressed.    Past  Medical History:  Diagnosis Date  . Depression   . Diabetes mellitus without complication ([place])   . Pain of left breast     No past surgical history on file.  Family History  Problem Relation Age of Onset  . Depression Sister   . Alcohol abuse Brother   . Diabetes Brother     Social History:  reports that  has never smoked. she has never used smokeless tobacco. She reports that she does not drink alcohol or use drugs.    Review of Systems   ROS      Lipids: Borderline, Not on any statins  Lab Results  Component Value Date   CHOL 185 10/24/2017   HDL 69.80 10/24/2017   LDLCALC 103 (H) 10/24/2017   TRIG 61.0 10/24/2017   CHOLHDL 3 10/24/2017    Depression: She is taking Wellbutrin and Prozac       She has a history of Numbness and, tingling in her feet       Physical Examination:  Ht 5\' 2"  (1.575 m)   Wt 141 lb 9.6 oz (64.2 kg)   BMI 25.90 kg/m     ASSESSMENT:  Diabetes type 1, long-standing and poorly controlled See history of present illness for detailed discussion of current diabetes management, blood sugar patterns and problems identified  A1c is still high at 8.2  She has relatively poor understanding of her day-to-day management of her insulin She is not taking her NovoLog based on carbohydrate counting Also not taking into account that she is tending to have hypoglycemia during the day when she is more active or doing errands She is also not appropriately treating hypoglycemia with simple sugars Hyperglycemia is mostly  related to her not taking her NovoLog before DINNER causing blood sugars to be markedly increased late evening and part of the night; this may also be worsened by rebound hyperglycemia because of her inappropriate treatment  Most likely needs somewhat less basal insulin during the day and more at night  PLAN:   She will try to reduce her insulin coverage during the day and use only 1:15, by the coverage for breakfast and lunch  Otherwise can take 1 in 10 coverage for dinner  Discussed importance of taking NOVOLOG right at meal time in the evening and may even take it right after eating including after treating any hypoglycemia appropriately  Also needs to have a snack before being active  She will need to treat her hypoglycemia appropriately with glucose tablets or juice and not just with food.  She needs to proactively treat downward trends on her blood sugar also  Currently since she has a large supply of Lantus she can continue this twice a day but reduce the morning dose to 6 units and increase the evening dose by 2 units  She was explained the use of the Medtronic 670 pump and she will look into this  Meanwhile she can update her DexCom sensor since currently having technical problems with the G5  Needs to start carbohydrate counting for covering all her meals and large snacks  Follow-up in 8 weeks She will be scheduled to see the dietitian and nurse educator for better meal planning and carbohydrate counting   There are no Patient Instructions on file for this visit.  Counseling time on subjects discussed above is over 50% of today's 25 minute visit    Sheryl Matthews 11/01/2017, 10:56 AM   Note: This note was prepared with Estate agent. Any  transcriptional errors that result from this process are unintentional.

## 2017-11-01 NOTE — Patient Instructions (Addendum)
Take 6 Lantus in am and 10 in pm  Take 1 unit per 15g carbs for Bfst and lunch and 1:10 for dinner meal  Take Novolog WITH DINNER  And NOT WAIT   CALL BEFORE LANTUS OUT

## 2017-11-02 ENCOUNTER — Telehealth: Payer: Self-pay | Admitting: Endocrinology

## 2017-11-02 NOTE — Telephone Encounter (Signed)
PT called [facility] and she has one sensor left which will last 7 days PT  needs [place] to fax over dr notes stating she has been seen and still needs sensor to [place] [phone]

## 2017-11-07 NOTE — Telephone Encounter (Signed)
I have faxed this information to the fax number provided. I called the patient and she stated that this is the correct number to send to.

## 2017-11-12 NOTE — Telephone Encounter (Signed)
-----   Message from Donella Stade, [place] sent at 01/22/2017 12:17 PM EST ----- Follow up left breast mass.

## 2017-11-12 NOTE — Telephone Encounter (Signed)
Call pt: needs 6 month follow up of breast mass to confirm stability. Has this been ordered or does she need it ordered?

## 2017-11-22 NOTE — Telephone Encounter (Signed)
°  [employer] sent over fax to [place]. Pt is calling to see if we have received and gave to dr.   I called pt back to see if there was something else the company needed since prior notes show we faxed over dr notes to the [facility] stating that she had been seen by [place] and needed [facility] sensor  Pt did not answer when called back and no VM.    Please advise.

## 2017-11-28 DIAGNOSIS — M7711 Lateral epicondylitis, right elbow: Secondary | ICD-10-CM | POA: Diagnosis not present

## 2017-11-28 DIAGNOSIS — M25521 Pain in right elbow: Secondary | ICD-10-CM | POA: Diagnosis not present

## 2017-11-29 ENCOUNTER — Telehealth (HOSPITAL_COMMUNITY): Payer: Self-pay

## 2017-11-29 ENCOUNTER — Other Ambulatory Visit (HOSPITAL_COMMUNITY): Payer: Self-pay

## 2017-11-29 DIAGNOSIS — F3130 Bipolar disorder, current episode depressed, mild or moderate severity, unspecified: Secondary | ICD-10-CM

## 2017-11-29 MED ORDER — BUPROPION HCL ER (XL) 150 MG PO TB24: 150.0000 mg | ORAL_TABLET | Freq: Every day | ORAL | 0 refills | Status: DC

## 2017-11-29 NOTE — Telephone Encounter (Signed)
Patient called and made an appointment and asked for a refill on Wellbutrin 150mg . Patient has not been seen since April 2018. Sent a one month supply to pharmacy to hold patient over until her appointment. Nothing further is needed at this time.

## 2017-11-30 NOTE — Telephone Encounter (Signed)
I located the fax from [employer] and re-faxed the office notes that had been requested.

## 2017-12-03 DIAGNOSIS — R1013 Epigastric pain: Secondary | ICD-10-CM | POA: Diagnosis not present

## 2017-12-03 DIAGNOSIS — K5909 Other constipation: Secondary | ICD-10-CM | POA: Diagnosis not present

## 2017-12-03 DIAGNOSIS — K219 Gastro-esophageal reflux disease without esophagitis: Secondary | ICD-10-CM | POA: Diagnosis not present

## 2017-12-03 DIAGNOSIS — R1310 Dysphagia, unspecified: Secondary | ICD-10-CM | POA: Diagnosis not present

## 2017-12-11 ENCOUNTER — Encounter: Payer: Medicare Other | Attending: Endocrinology | Admitting: Nutrition

## 2017-12-11 DIAGNOSIS — E1065 Type 1 diabetes mellitus with hyperglycemia: Secondary | ICD-10-CM | POA: Insufficient documentation

## 2017-12-11 DIAGNOSIS — Z713 Dietary counseling and surveillance: Secondary | ICD-10-CM | POA: Insufficient documentation

## 2017-12-12 ENCOUNTER — Other Ambulatory Visit: Payer: Self-pay | Admitting: Endocrinology

## 2017-12-12 DIAGNOSIS — E1065 Type 1 diabetes mellitus with hyperglycemia: Secondary | ICD-10-CM

## 2017-12-14 NOTE — Patient Instructions (Signed)
Read over material given a call if questions

## 2017-12-14 NOTE — Progress Notes (Signed)
We discussed again the advantages of insulin pump therapy and the need for carb counting for accurate meal time insulin.  She was shown the 3 pumps and given brochures on each one.  We discussed the advantages/disadvantages of each model, and she agreed to review this material and let me know what She wants to do.  I again stressed that she has 30 days to change her mind if she does not like this therapy.

## 2017-12-18 ENCOUNTER — Ambulatory Visit (HOSPITAL_COMMUNITY): Payer: Medicare Other | Admitting: Psychiatry

## 2017-12-18 ENCOUNTER — Telehealth: Payer: Self-pay | Admitting: Physician Assistant

## 2017-12-18 ENCOUNTER — Ambulatory Visit (INDEPENDENT_AMBULATORY_CARE_PROVIDER_SITE_OTHER): Payer: Medicare Other | Admitting: Physician Assistant

## 2017-12-18 ENCOUNTER — Encounter: Payer: Self-pay | Admitting: Physician Assistant

## 2017-12-18 ENCOUNTER — Encounter (HOSPITAL_COMMUNITY): Payer: Self-pay | Admitting: Psychiatry

## 2017-12-18 ENCOUNTER — Ambulatory Visit: Payer: Self-pay | Admitting: Physician Assistant

## 2017-12-18 ENCOUNTER — Ambulatory Visit (INDEPENDENT_AMBULATORY_CARE_PROVIDER_SITE_OTHER): Payer: Medicare Other | Admitting: Psychiatry

## 2017-12-18 VITALS — BP 138/62 | HR 75 | Ht 62.0 in | Wt 142.0 lb

## 2017-12-18 VITALS — BP 118/60 | HR 79 | Ht 62.0 in | Wt 141.0 lb

## 2017-12-18 DIAGNOSIS — F3130 Bipolar disorder, current episode depressed, mild or moderate severity, unspecified: Secondary | ICD-10-CM | POA: Diagnosis not present

## 2017-12-18 DIAGNOSIS — G2581 Restless legs syndrome: Secondary | ICD-10-CM | POA: Diagnosis not present

## 2017-12-18 DIAGNOSIS — E1065 Type 1 diabetes mellitus with hyperglycemia: Secondary | ICD-10-CM | POA: Diagnosis not present

## 2017-12-18 DIAGNOSIS — N6320 Unspecified lump in the left breast, unspecified quadrant: Secondary | ICD-10-CM | POA: Diagnosis not present

## 2017-12-18 DIAGNOSIS — G894 Chronic pain syndrome: Secondary | ICD-10-CM | POA: Diagnosis not present

## 2017-12-18 DIAGNOSIS — F5102 Adjustment insomnia: Secondary | ICD-10-CM | POA: Diagnosis not present

## 2017-12-18 DIAGNOSIS — F411 Generalized anxiety disorder: Secondary | ICD-10-CM | POA: Diagnosis not present

## 2017-12-18 DIAGNOSIS — F3112 Bipolar disorder, current episode manic without psychotic features, moderate: Secondary | ICD-10-CM | POA: Diagnosis not present

## 2017-12-18 DIAGNOSIS — G4700 Insomnia, unspecified: Secondary | ICD-10-CM

## 2017-12-18 DIAGNOSIS — M797 Fibromyalgia: Secondary | ICD-10-CM | POA: Diagnosis not present

## 2017-12-18 DIAGNOSIS — F331 Major depressive disorder, recurrent, moderate: Secondary | ICD-10-CM | POA: Diagnosis not present

## 2017-12-18 MED ORDER — BUPROPION HCL ER (XL) 150 MG PO TB24: 150.0000 mg | ORAL_TABLET | Freq: Every day | ORAL | 1 refills | Status: DC

## 2017-12-18 MED ORDER — DICLOFENAC SODIUM 75 MG PO TBEC: 75.0000 mg | DELAYED_RELEASE_TABLET | Freq: Two times a day (BID) | ORAL | 5 refills | Status: DC

## 2017-12-18 MED ORDER — DULOXETINE HCL 30 MG PO CPEP: 30.0000 mg | ORAL_CAPSULE | Freq: Two times a day (BID) | ORAL | 1 refills | Status: DC

## 2017-12-18 MED ORDER — TRAMADOL HCL 50 MG PO TABS: 50.0000 mg | ORAL_TABLET | Freq: Three times a day (TID) | ORAL | 0 refills | Status: DC | PRN

## 2017-12-18 MED ORDER — GABAPENTIN ENACARBIL ER 300 MG PO TBCR: 1.0000 | EXTENDED_RELEASE_TABLET | Freq: Every day | ORAL | 1 refills | Status: DC

## 2017-12-18 NOTE — Progress Notes (Addendum)
Subjective:    Patient ID: Sheryl Matthews, female    DOB: Oct 31, 1966, 52 y.o.   MRN: [id number]  HPI  Pt is a 52 year old female with a history of bipolar 1, DM, Fibromyalgia, and a left breast mass.  Fibromyalgia: Pt reports that she has had worsening pain with intermittent swelling in the past few months. She takes Prozac, wellbutrin and gabapentin (300mg  at bedtime) to help with her pain and depression.gabapentin she does not like how groggy the next day she feels.  She tried Lyrica in the past and had negative side effects including worsening depression when she took it. She has reports that she feels worse recently and is concerned about undiagnosed lupus because her twin sister was recently diagnosed with SLE. She has seen two rheumatologists, Dr. Barkley Boards and Dr. Abner Greenspan for these concerns which have lingered due to worsening pain and a borderline ANA result that was resulted when she saw Dr. Barkley Boards. Repeat ANAs have been done but have come back negative. She would like another opinion and has requested a referall to see Dr. Delphia Grates in [place]. She has a history of joint pain, arthritis, and a neck surgery that has she reports have made her chronic pain worse. The pain from her fibromyalgia has been making it difficult to sleep and she reports that she is open to try anything to help get her symptoms under control. She also has a lot of problems with jumping legs at night. gapapentin helps some but she does not like side effects.   Bipolar/Depression: She sees. Dr. De Nurse currently for management of her bipolar type 1. She believes that her current medications have lost efficacy and thinks she would like to change her medications to manage this. She is considering seeking out a new psychiatrist as she would like a doctor that she connects better with to adjust her medications.    Diabetes: She has had decreased appetite but has been using her insulin. She has been having issues with lower blood  sugar since she has been eating less. She has a monitor that checks her sugars and alerts her when she is low so she is aware when to increase her sugar intake. She is currently managed by Dr. Dwyane Dee and has a follow up with him on February 6th.   GI: She reports stomach pains and discomfort but is being managed by a gastroenterologist. She reports that she is getting a HIDA scan and will follow up with them.   Left Breast Mass: She is aware of her follow up imaging and we are putting in an order for a repeat 6 month diagnostic mammogram and left breast ultrasound.   Review of Systems  Constitutional: Positive for appetite change. Negative for chills and fever.  HENT: Negative for congestion and sore throat.   Respiratory: Negative for cough and shortness of breath.   Cardiovascular: Negative for chest pain and palpitations.  Musculoskeletal: Positive for arthralgias and myalgias.       Objective:   Physical Exam  Constitutional: She is oriented to person, place, and time. She appears well-developed and well-nourished. No distress.  HENT:  Head: Normocephalic and atraumatic.  Cardiovascular: Normal rate, regular rhythm and normal heart sounds. Exam reveals no gallop and no friction rub.  No murmur heard. Pulmonary/Chest: Effort normal and breath sounds normal. She has no wheezes.  Neurological: She is alert and oriented to person, place, and time.  Psychiatric: She has a normal mood and affect. Her behavior is  normal.   Vitals:   12/18/17 1025  BP: 138/62  Pulse: 75      Assessment & Plan:  Diagnoses and all orders for this visit:  Fibromyalgia -     traMADol (ULTRAM) 50 MG tablet; Take 1 tablet (50 mg total) by mouth every 8 (eight) hours as needed for moderate pain or severe pain. -     diclofenac (VOLTAREN) 75 MG EC tablet; Take 1 tablet (75 mg total) by mouth 2 (two) times daily. -     Gabapentin Enacarbil ER 300 MG TBCR; Take 1 tablet by mouth daily. -     Ambulatory  referral to Rheumatology  Uncontrolled type 1 diabetes mellitus with hyperglycemia ([place])  Bipolar 1 disorder with moderate mania ([facility])  Left breast mass -     MM Digital Diagnostic Bilat -     US BREAST COMPLETE UNI LEFT INC AXILLA  Chronic pain syndrome -     Ambulatory referral to Rheumatology     Adalyn was given a handout on fibromyalgia and fibromyalgia diets. We discussed her pain and decided to try extended release gabapentin ([facility]) and sent that to her local pharmacy. We will send it to [facility] if she would like to continue this long term. She was also prescribed diclofenac to replace her daily NSAIDs. She should discontinue her immediate acting gabapentin (Neurontin) and her Naprosyn / ibuprofen while taking her new medications. A referral was sent to Dr. Delphia Grates for her SLE and fibromyalgia evaluation. We discussed exploring different treatment options like acupuncture, diet changes, and massages to help with pain along with addition of SSNRI.   She will follow up with her psychiatrist today and will suggest SNRI's like Cymbalta to try to relief her symptoms. We discussed that SNRI's may help with her depression and fibromyalgia pain and she said she will discuss this with her psychiatrist.   We also placed the order for her follow up imaging of her breasts which includes a diagnostic mammogram and a left breast ultrasound.   DM- managed by endocrinology.   Marland Kitchen.Spent 30 minutes with patient and greater than 50 percent of visit spent counseling patient regarding treatment plan.
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
## 2017-12-18 NOTE — Patient Instructions (Addendum)
Delphia Grates, rheumatologist.   SSNRI's have been linked to better pain control.    Myofascial Pain Syndrome and Fibromyalgia Myofascial pain syndrome and fibromyalgia are both pain disorders. This pain may be felt mainly in your muscles.  Myofascial pain syndrome: ? Always has trigger points or tender points in the muscle that will cause pain when pressed. The pain may come and go. ? Usually affects your neck, upper back, and shoulder areas. The pain often radiates into your arms and hands.  Fibromyalgia: ? Has muscle pains and tenderness that come and go. ? Is often associated with fatigue and sleep disturbances. ? Has trigger points. ? Tends to be long-lasting (chronic), but is not life-threatening.  Fibromyalgia and myofascial pain are not the same. However, they often occur together. If you have both conditions, each can make the other worse. Both are common and can cause enough pain and fatigue to make day-to-day activities difficult. What are the causes? The exact causes of fibromyalgia and myofascial pain are not known. People with certain gene types may be more likely to develop fibromyalgia. Some factors can be triggers for both conditions, such as:  Spine disorders.  Arthritis.  Severe injury (trauma) and other physical stressors.  Being under a lot of stress.  A medical illness.  What are the signs or symptoms? Fibromyalgia The main symptom of fibromyalgia is widespread pain and tenderness in your muscles. This can vary over time. Pain is sometimes described as stabbing, shooting, or burning. You may have tingling or numbness, too. You may also have sleep problems and fatigue. You may wake up feeling tired and groggy (fibro fog). Other symptoms may include:  Bowel and bladder problems.  Headaches.  Visual problems.  Problems with odors and noises.  Depression or mood changes.  Painful menstrual periods (dysmenorrhea).  Dry skin or eyes.  Myofascial pain  syndrome Symptoms of myofascial pain syndrome include:  Tight, ropy bands of muscle.  Uncomfortable sensations in muscular areas, such as: ? Aching. ? Cramping. ? Burning. ? Numbness. ? Tingling. ? Muscle weakness.  Trouble moving certain muscles freely (range of motion).  How is this diagnosed? There are no specific tests to diagnose fibromyalgia or myofascial pain syndrome. Both can be hard to diagnose because their symptoms are common in many other conditions. Your health care provider may suspect one or both of these conditions based on your symptoms and medical history. Your health care provider will also do a physical exam. The key to diagnosing fibromyalgia is having pain, fatigue, and other symptoms for more than three months that cannot be explained by another condition. The key to diagnosing myofascial pain syndrome is finding trigger points in muscles that are tender and cause pain elsewhere in your body (referred pain). How is this treated? Treating fibromyalgia and myofascial pain often requires a team of health care providers. This usually starts with your primary provider and a physical therapist. You may also find it helpful to work with alternative health care providers, such as massage therapists or acupuncturists. Treatment for fibromyalgia may include medicines. This may include nonsteroidal anti-inflammatory drugs (NSAIDs), along with other medicines. Treatment for myofascial pain may also include:  NSAIDs.  Cooling and stretching of muscles.  Trigger point injections.  Sound wave (ultrasound) treatments to stimulate muscles.  Follow these instructions at home:  Take medicines only as directed by your health care provider.  Exercise as directed by your health care provider or physical therapist.  Try to avoid stressful situations.  Practice relaxation techniques to control your stress. You may want to try: ? Biofeedback. ? Visual  imagery. ? Hypnosis. ? Muscle relaxation. ? Yoga. ? Meditation.  Talk to your health care provider about alternative treatments, such as acupuncture or massage treatment.  Maintain a healthy lifestyle. This includes eating a healthy diet and getting enough sleep.  Consider joining a support group.  Do not do activities that stress or strain your muscles. That includes repetitive motions and heavy lifting. Where to find more information:  National Fibromyalgia Association: www.fmaware.[place]: www.arthritis.org  American Chronic Pain Association: OEMDeals.dk Contact a health care provider if:  You have new symptoms.  Your symptoms get worse.  You have side effects from your medicines.  You have trouble sleeping.  Your condition is causing depression or anxiety. This information is not intended to replace advice given to you by your health care provider. Make sure you discuss any questions you have with your health care provider. Document Released: 11/13/2005 Document Revised: 04/20/2016 Document Reviewed: 08/19/2014 Elsevier Interactive Patient Education  [employer].

## 2017-12-18 NOTE — Telephone Encounter (Signed)
Received PA on [employer] sent through cover my meds and received approval. - CF  Case ID: [id number] Valid: 11/18/17 - 11/26/98   I called and let the pharmacy know. - CF

## 2017-12-18 NOTE — Addendum Note (Signed)
Addended byDonella Stade on: 12/18/2017 12:02 PM   Modules accepted: Orders

## 2017-12-19 DIAGNOSIS — R1013 Epigastric pain: Secondary | ICD-10-CM | POA: Diagnosis not present

## 2017-12-19 DIAGNOSIS — R110 Nausea: Secondary | ICD-10-CM | POA: Diagnosis not present

## 2017-12-19 DIAGNOSIS — R109 Unspecified abdominal pain: Secondary | ICD-10-CM | POA: Diagnosis not present

## 2017-12-24 ENCOUNTER — Telehealth (HOSPITAL_COMMUNITY): Payer: Self-pay

## 2017-12-24 NOTE — Telephone Encounter (Signed)
Patient was seen last Tuesday and had the GeneSight testing done. Patient did not make a follow up appointment. She wants to come in and pick up a copy of the results. Does patient need an appointment to go over results before she can get a copy? Please review and advise.

## 2017-12-25 NOTE — Telephone Encounter (Signed)
If she is doing fair and no concerns. Can pick up results. Preferably appointment and review is helpful

## 2017-12-25 NOTE — Telephone Encounter (Signed)
Patient is out of town right now. She will call and make an appointment when she comes back to go over the results of the GeneSight test. Nothing further is needed at this time.

## 2017-12-27 ENCOUNTER — Telehealth: Payer: Self-pay | Admitting: Physician Assistant

## 2017-12-27 ENCOUNTER — Encounter (HOSPITAL_COMMUNITY): Payer: Self-pay | Admitting: Psychiatry

## 2017-12-27 ENCOUNTER — Ambulatory Visit (INDEPENDENT_AMBULATORY_CARE_PROVIDER_SITE_OTHER): Payer: Medicare Other | Admitting: Psychiatry

## 2017-12-27 VITALS — BP 122/80 | HR 86 | Ht 62.0 in | Wt 140.0 lb

## 2017-12-27 DIAGNOSIS — Z79899 Other long term (current) drug therapy: Secondary | ICD-10-CM

## 2017-12-27 DIAGNOSIS — F411 Generalized anxiety disorder: Secondary | ICD-10-CM

## 2017-12-27 DIAGNOSIS — F5102 Adjustment insomnia: Secondary | ICD-10-CM

## 2017-12-27 DIAGNOSIS — F3130 Bipolar disorder, current episode depressed, mild or moderate severity, unspecified: Secondary | ICD-10-CM

## 2017-12-27 DIAGNOSIS — Z01411 Encounter for gynecological examination (general) (routine) with abnormal findings: Secondary | ICD-10-CM | POA: Diagnosis not present

## 2017-12-27 DIAGNOSIS — G4700 Insomnia, unspecified: Secondary | ICD-10-CM

## 2017-12-27 DIAGNOSIS — M797 Fibromyalgia: Secondary | ICD-10-CM

## 2017-12-27 DIAGNOSIS — N939 Abnormal uterine and vaginal bleeding, unspecified: Secondary | ICD-10-CM | POA: Diagnosis not present

## 2017-12-27 DIAGNOSIS — N938 Other specified abnormal uterine and vaginal bleeding: Secondary | ICD-10-CM | POA: Diagnosis not present

## 2017-12-27 MED ORDER — FLUOXETINE HCL 20 MG PO CAPS: 80.0000 mg | ORAL_CAPSULE | Freq: Every day | ORAL | 0 refills | Status: DC

## 2017-12-27 NOTE — Progress Notes (Signed)
[facility] [phone] Progress Note  Sheryl Matthews [id number] 52 y.o.  12/27/2017 3:41 PM  Chief Complaint:  Bipolar disorder MRE depressed History of Present Illness:  Patient has been diagnosed with bipolar, returns for follow up to review gene testing Last visit she has returned after 9 months.   She was supposed to start cymbalta and stop prozac after last visit. Said she didn't do it and forgot how to do it. She visited her sister in [place] but now worried about her health  She also has made appointment withanother psychiatrist in [place] she will follow  I went thru gene testing as shows prozac better option then cymbalta wellbutrin is fair She decided to continue prozac and increase that dose for now  .    Fibromyalgia ongoing effects mood  upset about her sister s condition who is twin  Duration ; since young age  Modifying factor: husband  Insomnia: no change  Primary care has changed gabapentin to long acting Denies drug use or ABUSE of alcohol as of now     Past Medical Family, Social History: Husband is finishing his deployment . She is glad that he is home  Outpatient Encounter Medications as of 12/27/2017  Medication Sig  . Alcohol Swabs PADS Use as instructed  . buPROPion (WELLBUTRIN XL) 150 MG 24 hr tablet Take 1 tablet (150 mg total) by mouth daily.  . diclofenac (VOLTAREN) 75 MG EC tablet Take 1 tablet (75 mg total) by mouth 2 (two) times daily.  . Emollient (VANICREAM EX) Apply topically as needed.  Marland Kitchen FREESTYLE LITE test strip USE AS INSTRUCTED TO CHECK BLOOD SUGAR SIX TIMES PER DAY  . gabapentin (NEURONTIN) 300 MG capsule TAKE 1 CAPSULE DAILY.  . Gabapentin Enacarbil ER 300 MG TBCR Take 1 tablet by mouth daily.  Marland Kitchen glucagon 1 MG injection Inject 1 mg intramuscularly for severe hypoglycemia  . ibuprofen (ADVIL,MOTRIN) 200 MG tablet Take 200 mg by mouth as needed.  . insulin aspart (NOVOLOG FLEXPEN) 100 UNIT/ML FlexPen Inject 6-7  units three times a day with meals  . Insulin Pen Needle (BD PEN NEEDLE NANO U/F) 32G X 4 MM MISC Use 7 per day to inject insulin  . KETOSTIX strip USE TO CHECK URINE KETONES IF BLOOD SUGAR IS OVER 300 AS DIRECTED BY PHYSICIAN  . LANTUS SOLOSTAR 100 UNIT/ML Solostar Pen INJECT 20 UNITS UNDER THE SKIN TWICE A DAY (Patient taking differently: INJECT 8 UNITS UNDER THE SKIN TWICE A DAY)  . Multiple Vitamins-Minerals (HAIR VITAMINS PO) Take by mouth daily.  . naproxen (NAPROSYN) 500 MG tablet TAKE 1 TABLET DAILY  . pantoprazole (PROTONIX) 40 MG tablet Take 40 mg by mouth daily.  . Probiotic Product (PROBIOTIC DAILY PO) Take by mouth.  . traMADol (ULTRAM) 50 MG tablet Take 1 tablet (50 mg total) by mouth every 8 (eight) hours as needed for moderate pain or severe pain.  . [DISCONTINUED] DULoxetine (CYMBALTA) 30 MG capsule Take 1 capsule (30 mg total) by mouth 2 (two) times daily. Start with one a day for one week and then take twice a day  . FLUoxetine (PROZAC) 20 MG capsule Take 4 capsules (80 mg total) by mouth daily.  . [DISCONTINUED] fexofenadine (ALLEGRA) 180 MG tablet Take 1 tablet (180 mg total) by mouth daily.  . [DISCONTINUED] FLUoxetine (PROZAC) 20 MG capsule Take 3 capsules (60 mg total) by mouth daily.  . [DISCONTINUED] fluticasone (FLONASE) 50 MCG/ACT nasal spray USE 1 SPRAY IN  EACH NOSTRIL DAILY  . [DISCONTINUED] lamoTRIgine (LAMICTAL) 25 MG tablet Take 1 tablet (25 mg total) by mouth daily. Take one tablet daily for a week and then start taking 2 tablets.   No facility-administered encounter medications on file as of 12/27/2017.      Physical Exam: Constitutional:  BP 122/80 (BP Location: Left Arm, Patient Position: Sitting, Cuff Size: Normal)   Pulse 86   Ht 5\' 2"  (1.575 m)   Wt 140 lb (63.5 kg)   BMI 25.61 kg/m   General Appearance: alert, oriented, no acute distress ROS: no rash. Depression fluctutates. No chest pain    Psychiatric: Speech (describe rate, volume,  coherence, spontaneity, and abnormalities if any): clear and goal directed, normal rate volume and rhythm  Thought Process (describe rate, content, abstract reasoning, and computation): normal  Associations: Coherent and Relevant  Thoughts: normal  Mental Status: Orientation: oriented to person, place and time/date Mood & Affect subuded  Attention Span & Concentration: good No psychotic symptoms. Alert and oriented with reasonable judjement.     Assessment: Axis I: Bipolar MRE depressed, GAD. insomnia  Plan:  Bipolar: more clear history of recurrent depression rather mania ; currently depressed. Will incrdease prozac to 80mg . Continue gabapentin Gabapentin by primary care would act as mood stabilizer as well  Depression: subuded. Hold off cymbalta since she didn't started  continue wellbutrin Generalized anxiety disorder;continue prozac as above  Has decided to keep the other psych appiontment. Can be discharged. Provided supportive therapy and went thru her questions and concerns    Merian Capron, MD 12/27/2017

## 2017-12-27 NOTE — Patient Instructions (Signed)
Patient has made appointment with another psychiatrist in [place] . Can be discharged from this clinic

## 2017-12-27 NOTE — Telephone Encounter (Signed)
Patient needs to know what medication she is not supposed to take with the Gabapentin. She stated she is still taking Ibuprofen and wants to make sure that is all right. Also, in general, what medications should she avoid while taking Gabapentin. Please advise patient. Thanks!

## 2017-12-28 NOTE — Telephone Encounter (Signed)
Yes you can still take ibuprofen. I reviewed med list and ok to take all meds on med list.

## 2017-12-28 NOTE — Telephone Encounter (Signed)
Routing to PCP for review.

## 2017-12-28 NOTE — Telephone Encounter (Signed)
Pt advised,verbalized understanding. 

## 2018-01-01 ENCOUNTER — Other Ambulatory Visit: Payer: Self-pay

## 2018-01-01 ENCOUNTER — Other Ambulatory Visit: Payer: Self-pay | Admitting: Physician Assistant

## 2018-01-01 ENCOUNTER — Other Ambulatory Visit: Payer: Self-pay | Admitting: Endocrinology

## 2018-01-01 IMAGING — NM NM MISC PROCEDURE
3 series · 18 of 18 positions shown · non-contrast
Comparison: none

[Series 1: rest_(id)_sa · 6.4mm · 6.40mm/px · 6 of 64 frames shown]
[frame 6/64]
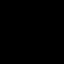
[frame 16/64]
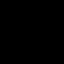
[frame 27/64]
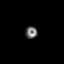
[frame 38/64]
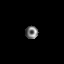
[frame 48/64]
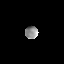
[frame 59/64]
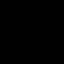

[Series 1: stress-sum-em_(id)_sa · 6.4mm · 6.40mm/px · 6 of 64 frames shown]
[frame 6/64]
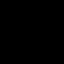
[frame 16/64]
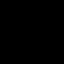
[frame 27/64]
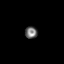
[frame 38/64]
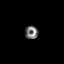
[frame 48/64]
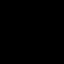
[frame 59/64]
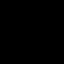

[Series 1: stress-gsp_(id)_sa · 6.4mm · 6.40mm/px · 6 of 512 frames shown]
[frame 43/512]
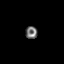
[frame 128/512]
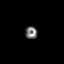
[frame 214/512]
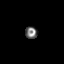
[frame 299/512]
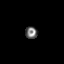
[frame 384/512]
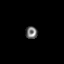
[frame 470/512]
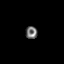

[18 of 18 positions shown; findings below may reference images not displayed]

Canned report from images found in remote index.

Refer to host system for actual result text.

## 2018-01-02 ENCOUNTER — Ambulatory Visit: Payer: Self-pay | Admitting: Endocrinology

## 2018-01-03 DIAGNOSIS — Z79899 Other long term (current) drug therapy: Secondary | ICD-10-CM | POA: Diagnosis not present

## 2018-01-03 DIAGNOSIS — F609 Personality disorder, unspecified: Secondary | ICD-10-CM | POA: Diagnosis not present

## 2018-01-03 DIAGNOSIS — F39 Unspecified mood [affective] disorder: Secondary | ICD-10-CM | POA: Diagnosis not present

## 2018-01-03 DIAGNOSIS — N950 Postmenopausal bleeding: Secondary | ICD-10-CM | POA: Diagnosis not present

## 2018-01-03 DIAGNOSIS — F419 Anxiety disorder, unspecified: Secondary | ICD-10-CM | POA: Diagnosis not present

## 2018-01-04 ENCOUNTER — Other Ambulatory Visit (INDEPENDENT_AMBULATORY_CARE_PROVIDER_SITE_OTHER): Payer: Medicare Other

## 2018-01-04 DIAGNOSIS — E1065 Type 1 diabetes mellitus with hyperglycemia: Secondary | ICD-10-CM

## 2018-01-04 LAB — GLUCOSE, RANDOM: Glucose, Bld: 250 mg/dL — ABNORMAL HIGH (ref 70–99)

## 2018-01-05 LAB — C-PEPTIDE

## 2018-01-07 ENCOUNTER — Other Ambulatory Visit: Payer: Self-pay

## 2018-01-09 ENCOUNTER — Other Ambulatory Visit: Payer: Self-pay | Admitting: Physician Assistant

## 2018-01-09 ENCOUNTER — Ambulatory Visit (INDEPENDENT_AMBULATORY_CARE_PROVIDER_SITE_OTHER): Payer: Medicare Other | Admitting: Endocrinology

## 2018-01-09 ENCOUNTER — Ambulatory Visit
Admission: RE | Admit: 2018-01-09 | Discharge: 2018-01-09 | Disposition: A | Discharge status: Home / Self Care | Payer: Medicare Other | Source: Ambulatory Visit | Attending: Physician Assistant | Admitting: Physician Assistant

## 2018-01-09 ENCOUNTER — Encounter: Payer: Self-pay | Admitting: Endocrinology

## 2018-01-09 VITALS — BP 119/86 | HR 83 | Ht 62.0 in | Wt 140.0 lb

## 2018-01-09 DIAGNOSIS — E1065 Type 1 diabetes mellitus with hyperglycemia: Secondary | ICD-10-CM

## 2018-01-09 DIAGNOSIS — Z840 Family history of diseases of the skin and subcutaneous tissue: Secondary | ICD-10-CM

## 2018-01-09 DIAGNOSIS — N6320 Unspecified lump in the left breast, unspecified quadrant: Secondary | ICD-10-CM | POA: Diagnosis not present

## 2018-01-09 DIAGNOSIS — R922 Inconclusive mammogram: Secondary | ICD-10-CM | POA: Diagnosis not present

## 2018-01-09 DIAGNOSIS — M0609 Rheumatoid arthritis without rheumatoid factor, multiple sites: Secondary | ICD-10-CM

## 2018-01-09 NOTE — Progress Notes (Addendum)
Patient ID: Sheryl Matthews, female   DOB: 1966/08/14, 52 y.o.   MRN: [id number]          Reason for Appointment : Follow-up for Type 1 Diabetes  History of Present Illness          Diagnosis: Type 1 diabetes mellitus, date of diagnosis:  at age 26    Previous history:   She has been on insulin since the time of diagnosis. She was on the same program of NPH and Regular Insulin since diagnosis was made when she was first seen here   Recent history:   INSULIN regimen is described as:  Lantus 8 U bid, Novolog 6-7 ac. 1:50 correction.  Her A1c is continuing to be high and is 8.2 in November   Glucose patterns, management and problems identified:    She is here for follow-up for management with her basal bolus insulin  She was advised to start doing carbohydrate counting to calculate her mealtime insulin and did not see a dietitian  Although she is reportedly trying to count carbohydrates using some electronic and paper resources she is giving herself or units of insulin to cover her oatmeal in the morning which is about 36 g of carbohydrate, she is adding extra insulin for her eggs  After much discussion it appears that patient is taking repeated doses of NovoLog whenever her blood sugars are going up  Last night blood sugar was rising after evening meal and she probably took 2 units, unclear when she took this as her fingerstick reading was only about 155; this caused her to be hypoglycemic around 1 AM  She appears to have hypoglycemia documented on her fingersticks at all different times of the day and evening or night on her 30 day download  She has significant VARIABILITY especially overnight and at least on 3 recordings the last 2 weeks she has had fairly good readings through the night  She may have a little down phenomena in the morning but again morning sugars are quite variable and as low as 45 last month  Her insulin is Lantus and her insurance does not cover [place]  She  was also asked to take her NOVOLOG right before eating and she is trying to do this  She thinks her blood sugars go up when she gets stressed  She appears to be a little obsessed with her blood sugars going up and was concerned in the office that her blood sugar was going up to 150-160 with feeling stressed  She is supposed to get her paperwork started for the insulin pump but on her lab test for C-peptide her blood sugar was 250   Glucose monitoring:    contour next meter This shows her average sugar to be 179 with standard deviation 98 and a blood sugar range of 33-451 No consistent pattern is seen at any time of day   CONTINUOUS GLUCOSE MONITORING RECORD INTERPRETATION    Dates of Recording:   Sensor  summary:  Average blood glucose for the period of recording is 190 with standard deviation  83, previously average 203 Time in range was 37%, 57% readings are high and 4+2% in the low range Previously time in range was 63%      Glycemic patterns:  Hyperglycemic episodes are occurring frequently through the night, some before noon and also after about 9 PM at night until late night   Hypoglycemic episodes occurred mostly in midday was or twice after 9 PM at night  Overnight periods:  She has had a pattern of nighttime highs over the last 2 weeks with only about 3 readings in the range with blood sugars persisting in the high reading Last night she had a low sugar following correcting high readings      Preprandial periods:  Blood sugar before breakfast on and averages about 220 but variable, blood sugar at her lunchtime is on an average 140 with some low sugars Dinnertime blood sugars are averaging about 180 with significant variability      Postprandial periods:  There is occasional high readings after breakfast in the morning and also after evening meal but not much after lunch      Hypoglycemia: I this is occurring on a few occasions between 11 AM and 3 PM and rarely before or  after supper            Hypoglycemia:  Factors causing hyperglycemia: Reduced food intake, exercise, increased activity  Symptoms of hypoglycemia: Weakness, dizziness, difficulty thinking, feels sweaty and shaky only if blood sugar in the 30s.  May recognize symptoms low 60 at times but inconsistently.  Has had seizures during the night from hypoglycemia Treatment of hypoglycemia: juice, peanut butter, Glucagon      Diabetes labs:  Lab Results  Component Value Date   HGBA1C 8.2 (H) 10/24/2017   HGBA1C 7.9 03/27/2017   HGBA1C 8.2 (H) 10/06/2016   Lab Results  Component Value Date   MICROALBUR 1.0 10/24/2017   LDLCALC 103 (H) 10/24/2017   CREATININE 0.71 10/24/2017    Allergies as of 01/09/2018      Reactions   Latex Rash   Tape Rash   Elavil [amitriptyline Hcl]    Groggy/constipation/did not think worked    Lyrica [pregabalin]    Did not have benefit.  Falling asleep. Feeling depressed.      Medication List        Accurate as of 01/09/18  5:00 PM. Always use your most recent med list.          Alcohol Swabs Pads Use as instructed   buPROPion 150 MG 24 hr tablet Commonly known as:  WELLBUTRIN XL Take 1 tablet (150 mg total) by mouth daily.   diclofenac 75 MG EC tablet Commonly known as:  VOLTAREN Take 1 tablet (75 mg total) by mouth 2 (two) times daily.   FLUoxetine 20 MG capsule Commonly known as:  PROZAC Take 4 capsules (80 mg total) by mouth daily.   fluticasone 50 MCG/ACT nasal spray Commonly known as:  FLONASE USE 1 SPRAY IN EACH NOSTRIL DAILY   FREESTYLE LITE test strip Generic drug:  glucose blood USE AS INSTRUCTED TO CHECK BLOOD SUGAR SIX TIMES PER DAY   gabapentin 300 MG capsule Commonly known as:  NEURONTIN TAKE 1 CAPSULE DAILY.   Gabapentin Enacarbil ER 300 MG Tbcr Take 1 tablet by mouth daily.   glucagon 1 MG injection Inject 1 mg intramuscularly for severe hypoglycemia   HAIR VITAMINS PO Take by mouth daily.   ibuprofen  200 MG tablet Commonly known as:  ADVIL,MOTRIN Take 200 mg by mouth as needed.   insulin aspart 100 UNIT/ML FlexPen Commonly known as:  NOVOLOG FLEXPEN Inject 6-7 units three times a day with meals   Insulin Pen Needle 32G X 4 MM Misc Commonly known as:  BD PEN NEEDLE NANO U/F Use 7 per day to inject insulin   KETOSTIX strip Generic drug:  acetone (urine) test USE TO CHECK URINE KETONES IF BLOOD SUGAR  IS OVER 300 AS DIRECTED BY PHYSICIAN   LANTUS SOLOSTAR 100 UNIT/ML Solostar Pen Generic drug:  Insulin Glargine INJECT 20 UNITS UNDER THE SKIN TWICE A DAY   naproxen 500 MG tablet Commonly known as:  NAPROSYN TAKE 1 TABLET DAILY   pantoprazole 40 MG tablet Commonly known as:  PROTONIX Take 40 mg by mouth daily.   PROBIOTIC DAILY PO Take by mouth.   PROVERA 10 MG tablet Generic drug:  medroxyPROGESTERone Take 1 tablet by mouth daily.   traMADol 50 MG tablet Commonly known as:  ULTRAM Take 1 tablet (50 mg total) by mouth every 8 (eight) hours as needed for moderate pain or severe pain.   VANICREAM EX Apply topically as needed.       Allergies:  Allergies  Allergen Reactions  . Latex Rash  . Tape Rash  . Elavil [Amitriptyline Hcl]     Groggy/constipation/did not think worked   . Lyrica [Pregabalin]     Did not have benefit.  Falling asleep. Feeling depressed.    Past Medical History:  Diagnosis Date  . Depression   . Diabetes mellitus without complication ([place])   . Pain of left breast     Past Surgical History:  Procedure Laterality Date  . BREAST EXCISIONAL BIOPSY Left     Family History  Problem Relation Age of Onset  . Depression Sister   . Alcohol abuse Brother   . Diabetes Brother   . Breast cancer Maternal Aunt     Social History:  reports that  has never smoked. she has never used smokeless tobacco. She reports that she does not drink alcohol or use drugs.    Review of Systems   ROS      Lipids: Borderline, Not on any  statins  Lab Results  Component Value Date   CHOL 185 10/24/2017   HDL 69.80 10/24/2017   LDLCALC 103 (H) 10/24/2017   TRIG 61.0 10/24/2017   CHOLHDL 3 10/24/2017    Depression: She is taking Wellbutrin and Prozac She is think she gets stressed easily and is anxious      She has a history of Numbness and, tingling in her feet       Physical Examination:  BP 119/86 (BP Location: Left Arm, Patient Position: Sitting, Cuff Size: Normal)   Pulse 83   Ht 5\' 2"  (1.575 m)   Wt 140 lb (63.5 kg)   LMP 01/09/2018   BMI 25.61 kg/m     ASSESSMENT:  Diabetes type 1, long-standing and poorly controlled See history of present illness for detailed discussion of current diabetes management, blood sugar patterns and problems identified  A1c is still high at 8.2 as of November  She has significantly labile blood sugars at all different times She has a tendency to hypoglycemia at all different times also making her management difficult Part of her hypoglycemia appears to be from either overestimating her mealtime doses are taking extra insulin when her blood sugars go up after eating or in between meals which may cause low sugars also Since her FASTING readings are variable and she has occasional nocturnal hypoglycemia also not able to determine how much basal insulin she needs Most likely needs somewhat less basal insulin during the day but more recently in the last 2 weeks her blood sugars are mostly low at lunchtime because of taking too much coverage at breakfast    Discussed day-to-day management of her diet, insulin doses at mealtimes, continues glucose monitoring, hypoglycemia and hyperglycemia treatment  and prevention and goals for management   PLAN:   She will need to be more active with her carbohydrate counting  She will take only 2-3 units for breakfast instead of 4 when eating oatmeal and eggs  No change in Lantus but may need to check coverage for [place]  Emphasized  the need to eliminate low blood sugars before addressing her hyperglycemia as she has significant rebound  She will not take NovoLog in between meals or at bedtime and only take it as a correction dose before eating; she will take bedtime correction only if blood sugar is over 250  She will make sure she takes her NovoLog before eating as much as possible  Continue to use the DexCom sensor but advised her not to get stressed about her blood sugar going up in between meals as much  Follow-up in 8 weeks She will be scheduled to get another C-peptide done and make sure that her blood sugar is within the required range when she comes in Will schedule her for pump training when available  Patient Instructions  No Novolog except at meal time and only take bedtime dose if sugar >250      Counseling time on subjects discussed in assessment and plan sections is over 50% of today's 30 minute visit    Elayne Snare 01/09/2018, 5:00 PM   Note: This note was prepared with Dragon voice recognition system technology. Any transcriptional errors that result from this process are unintentional.   ADDENDUM:  Please note the patient is checking her blood sugar with contour meter twice a day for calibration  Elayne Snare

## 2018-01-09 NOTE — Patient Instructions (Addendum)
No Novolog except at meal time and only take bedtime dose if sugar >250

## 2018-01-09 NOTE — Progress Notes (Signed)
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 Follow up in 6 months.

## 2018-01-14 ENCOUNTER — Telehealth (HOSPITAL_COMMUNITY): Payer: Self-pay

## 2018-01-14 ENCOUNTER — Other Ambulatory Visit (HOSPITAL_COMMUNITY): Payer: Self-pay

## 2018-01-14 DIAGNOSIS — F3130 Bipolar disorder, current episode depressed, mild or moderate severity, unspecified: Secondary | ICD-10-CM

## 2018-01-14 DIAGNOSIS — F609 Personality disorder, unspecified: Secondary | ICD-10-CM | POA: Diagnosis not present

## 2018-01-14 DIAGNOSIS — F419 Anxiety disorder, unspecified: Secondary | ICD-10-CM | POA: Diagnosis not present

## 2018-01-14 DIAGNOSIS — F39 Unspecified mood [affective] disorder: Secondary | ICD-10-CM | POA: Diagnosis not present

## 2018-01-14 MED ORDER — BUPROPION HCL ER (XL) 150 MG PO TB24: 150.0000 mg | ORAL_TABLET | Freq: Every day | ORAL | 0 refills | Status: DC

## 2018-01-14 MED ORDER — FLUOXETINE HCL 20 MG PO CAPS: 80.0000 mg | ORAL_CAPSULE | Freq: Every day | ORAL | 0 refills | Status: DC

## 2018-01-14 NOTE — Telephone Encounter (Signed)
[facility] sent over a fax request for Fluoxetine 20 mg and Wellbutrin 150 mg. Sent over a 90 day supply of both medications. Patient will no longer be coming to our office. Dr. De Nurse wanted to give one last refill to last patient until she is seen elsewhere. Nothing further is needed at this time.

## 2018-01-17 DIAGNOSIS — R5383 Other fatigue: Secondary | ICD-10-CM | POA: Diagnosis not present

## 2018-01-23 ENCOUNTER — Telehealth: Payer: Self-pay

## 2018-01-23 DIAGNOSIS — F339 Major depressive disorder, recurrent, unspecified: Secondary | ICD-10-CM

## 2018-01-23 DIAGNOSIS — F3112 Bipolar disorder, current episode manic without psychotic features, moderate: Secondary | ICD-10-CM

## 2018-01-23 NOTE — Telephone Encounter (Signed)
Ok to make 2 referrals.

## 2018-01-23 NOTE — Telephone Encounter (Addendum)
Patient called in requesting a referral to [facility] for a psychiatrist and for counseling for her Bipolar Disorder and for her depression.   Patient states she needs two referrals instead of one.

## 2018-01-25 ENCOUNTER — Encounter: Payer: Self-pay | Admitting: Internal Medicine

## 2018-01-25 NOTE — Addendum Note (Signed)
Addended by: Tommas Olp B on: 01/25/2018 10:53 AM   Modules accepted: Orders

## 2018-01-25 NOTE — Telephone Encounter (Signed)
Modules: Ordered

## 2018-01-31 DIAGNOSIS — F3132 Bipolar disorder, current episode depressed, moderate: Secondary | ICD-10-CM | POA: Diagnosis not present

## 2018-01-31 DIAGNOSIS — F419 Anxiety disorder, unspecified: Secondary | ICD-10-CM | POA: Diagnosis not present

## 2018-01-31 DIAGNOSIS — F609 Personality disorder, unspecified: Secondary | ICD-10-CM | POA: Diagnosis not present

## 2018-02-05 DIAGNOSIS — M797 Fibromyalgia: Secondary | ICD-10-CM | POA: Diagnosis not present

## 2018-02-05 DIAGNOSIS — M150 Primary generalized (osteo)arthritis: Secondary | ICD-10-CM | POA: Diagnosis not present

## 2018-02-05 DIAGNOSIS — R112 Nausea with vomiting, unspecified: Secondary | ICD-10-CM | POA: Diagnosis not present

## 2018-02-05 DIAGNOSIS — K219 Gastro-esophageal reflux disease without esophagitis: Secondary | ICD-10-CM | POA: Diagnosis not present

## 2018-02-05 DIAGNOSIS — R1013 Epigastric pain: Secondary | ICD-10-CM | POA: Diagnosis not present

## 2018-02-05 DIAGNOSIS — K5900 Constipation, unspecified: Secondary | ICD-10-CM | POA: Diagnosis not present

## 2018-02-06 DIAGNOSIS — N938 Other specified abnormal uterine and vaginal bleeding: Secondary | ICD-10-CM | POA: Diagnosis not present

## 2018-02-06 DIAGNOSIS — R1900 Intra-abdominal and pelvic swelling, mass and lump, unspecified site: Secondary | ICD-10-CM | POA: Diagnosis not present

## 2018-02-06 DIAGNOSIS — R1909 Other intra-abdominal and pelvic swelling, mass and lump: Secondary | ICD-10-CM | POA: Diagnosis not present

## 2018-02-21 ENCOUNTER — Ambulatory Visit: Payer: Medicare Other | Admitting: Endocrinology

## 2018-02-22 ENCOUNTER — Ambulatory Visit: Payer: Medicare Other | Admitting: Endocrinology

## 2018-02-22 DIAGNOSIS — G4733 Obstructive sleep apnea (adult) (pediatric): Secondary | ICD-10-CM | POA: Diagnosis not present

## 2018-02-25 DIAGNOSIS — K8050 Calculus of bile duct without cholangitis or cholecystitis without obstruction: Secondary | ICD-10-CM | POA: Diagnosis not present

## 2018-02-27 ENCOUNTER — Other Ambulatory Visit (INDEPENDENT_AMBULATORY_CARE_PROVIDER_SITE_OTHER): Payer: Medicare Other

## 2018-02-27 DIAGNOSIS — E1065 Type 1 diabetes mellitus with hyperglycemia: Secondary | ICD-10-CM

## 2018-02-27 LAB — GLUCOSE, RANDOM: Glucose, Bld: 211 mg/dL — ABNORMAL HIGH (ref 70–99)

## 2018-02-28 ENCOUNTER — Other Ambulatory Visit: Payer: Medicare Other

## 2018-02-28 LAB — C-PEPTIDE: C-Peptide: <0.1 ng/mL — ABNORMAL LOW (ref 1.1–4.4)

## 2018-03-04 NOTE — Progress Notes (Addendum)
Patient ID: Sheryl Matthews, female   DOB: [birth year], 52 y.o.   MRN: [id number]          Reason for Appointment : Follow-up for Type 1 Diabetes  History of Present Illness          Diagnosis: Type 1 diabetes mellitus, date of diagnosis:  at age 82    Previous history:   She has been on insulin since the time of diagnosis. She was on the same program of NPH and Regular Insulin since diagnosis was made when she was first seen here   Recent history:   INSULIN regimen is described as:  Lantus 8 U bid, Novolog 6-7 mostly before meals, using. 1:50 correction.  Her A1c was 8.2 in November   Glucose patterns, management and problems identified:    She has been trying to get her insulin pump approved but because of her blood sugar being high fasting when she comes in for her C-peptide levels she has not been able to get the required formation for processing her pump application  On her last visit she was told not to take too much insulin with NovoLog in between meals for correction doses on top of her mealtime doses to avoid stacking he is trying to do this.  However most of her HYPERGLYCEMIA is related to binge eating later at night and her average blood sugar overnight is mostly over 250; last night she did not eat a snack at bedtime and her blood sugar was about 150 during the night and going up up to about 190 this morning  She is also getting hyperglycemia at breakfast times either from eating cereal or not taking her pre-meal insulin on time, not clear how much she is taking for breakfast  Otherwise does not appear to be getting any protein with her carbohydrate at breakfast as previously recommended  She was advised to start doing carbohydrate counting to calculate her mealtime insulin and did not see the dietitian as recommended  She is still not motivated to count carbohydrates on her own to help calculate her insulin doses  She is asking about benefits of doing the insulin pump  and is not very keen on doing this  No recent exercise       DEXCOM CONTINUOUS GLUCOSE MONITORING RECORD INTERPRETATION  CGM use % of time  96  Average and SD  202+/-79  Time in range  37     %  % Time Above 180  59  % Time above 250  27  % Time Below target  3.7     Dates of Recording: 02/18/18 through 03/05/18   Glycemic patterns:  Hyperglycemic episodes are occurring fairly frequently after her morning meal which can be anywhere between 8-10 AM.  Blood sugar is may be over 300 at times also has hyperglycemic episodes late in the evening after about 8-10 PM these occurred almost every night except last night with blood sugars going up to as much as 400.  The peak blood sugar is about 2 AM  Hypoglycemic episodes occurred mostly 7 PM as recorded on her CGM and mostly in the last week or so  Overnight periods: She has marked hyperglycemia overnight with blood sugars averaging well over 200 and around 250 until about 4 AM.  Sugars appear to be rising mostly after about 10 PM and significantly high until about at least 7 AM  Preprandial periods: Blood sugars are frequently high before breakfast, fairly close to 150 around lunchtime  and near normal or low before suppertime  Postprandial periods: She has marked hypoglycemic episodes after breakfast frequently and occasionally after lunch but not right after supper time when they are only mildly increased   Hypoglycemia:  Factors causing hyperglycemia: Reduced food intake, exercise, increased activity  Symptoms of hypoglycemia: Weakness, dizziness, difficulty thinking, feels sweaty and shaky only if blood sugar in the 30s.  May recognize symptoms low 60 at times but inconsistently.  Has had seizures during the night from hypoglycemia Treatment of hypoglycemia: juice, peanut butter, Glucagon      Diabetes labs:  Lab Results  Component Value Date   HGBA1C 7.8 03/05/2018   HGBA1C 8.2 (H) 10/24/2017   HGBA1C 7.9 03/27/2017   Lab  Results  Component Value Date   MICROALBUR 1.0 10/24/2017   LDLCALC 103 (H) 10/24/2017   CREATININE 0.71 10/24/2017    Allergies as of 03/05/2018      Reactions   Latex Rash   Tape Rash   Elavil [amitriptyline Hcl]    Groggy/constipation/did not think worked    Lyrica [pregabalin]    Did not have benefit.  Falling asleep. Feeling depressed.      Medication List        Accurate as of 03/05/18  3:53 PM. Always use your most recent med list.          Alcohol Swabs Pads Use as instructed   buPROPion 300 MG 24 hr tablet Commonly known as:  WELLBUTRIN XL Take 300 mg by mouth daily.   FLUoxetine 20 MG capsule Commonly known as:  PROZAC Take 4 capsules (80 mg total) by mouth daily.   fluticasone 50 MCG/ACT nasal spray Commonly known as:  FLONASE USE 1 SPRAY IN EACH NOSTRIL DAILY   FREESTYLE LITE test strip Generic drug:  glucose blood USE AS INSTRUCTED TO CHECK BLOOD SUGAR SIX TIMES PER DAY   gabapentin 300 MG capsule Commonly known as:  NEURONTIN TAKE 1 CAPSULE DAILY.   glucagon 1 MG injection Inject 1 mg intramuscularly for severe hypoglycemia   HAIR VITAMINS PO Take by mouth daily.   ibuprofen 200 MG tablet Commonly known as:  ADVIL,MOTRIN Take 200 mg by mouth as needed.   insulin aspart 100 UNIT/ML FlexPen Commonly known as:  NOVOLOG FLEXPEN Inject 6-7 units three times a day with meals   Insulin Pen Needle 32G X 4 MM Misc Commonly known as:  BD PEN NEEDLE NANO U/F Use 7 per day to inject insulin   KETOSTIX strip Generic drug:  acetone (urine) test USE TO CHECK URINE KETONES IF BLOOD SUGAR IS OVER 300 AS DIRECTED BY PHYSICIAN   LANTUS SOLOSTAR 100 UNIT/ML Solostar Pen Generic drug:  Insulin Glargine INJECT 20 UNITS UNDER THE SKIN TWICE A DAY   pantoprazole 40 MG tablet Commonly known as:  PROTONIX Take 40 mg by mouth daily.   PROBIOTIC DAILY PO Take by mouth.   ranitidine 300 MG tablet Commonly known as:  ZANTAC Take 300 mg by mouth  daily.   VANICREAM EX Apply topically as needed.       Allergies:  Allergies  Allergen Reactions  . Latex Rash  . Tape Rash  . Elavil [Amitriptyline Hcl]     Groggy/constipation/did not think worked   . Lyrica [Pregabalin]     Did not have benefit.  Falling asleep. Feeling depressed.    Past Medical History:  Diagnosis Date  . Depression   . Diabetes mellitus without complication ([place])   . Pain of left breast  Past Surgical History:  Procedure Laterality Date  . BREAST EXCISIONAL BIOPSY Left     Family History  Problem Relation Age of Onset  . Depression Sister   . Alcohol abuse Brother   . Diabetes Brother   . Breast cancer Maternal Aunt     Social History:  reports that she has never smoked. She has never used smokeless tobacco. She reports that she does not drink alcohol or use drugs.    Review of Systems   ROS      She is being followed by gastroenterologist for reflux and nausea possibly related to biliary disease  Lipids: Borderline LDL, Not on any statins  Lab Results  Component Value Date   CHOL 185 10/24/2017   HDL 69.80 10/24/2017   LDLCALC 103 (H) 10/24/2017   TRIG 61.0 10/24/2017   CHOLHDL 3 10/24/2017    Depression: She is taking Wellbutrin and Prozac Stating that she is again Getting stressed easily      She has a history of Numbness and, tingling in her feet      Physical Examination:  BP 118/62 (BP Location: Left Arm, Patient Position: Sitting, Cuff Size: Normal)   Pulse 86   Ht 5\' 2"  (1.575 m)   Wt 137 lb (62.1 kg)   SpO2 98%   BMI 25.06 kg/m     ASSESSMENT:  Diabetes type 1, long-standing and persistently poorly controlled  See history of present illness for detailed discussion of current diabetes management, blood sugar patterns and problems identified  A1c is still high at 8.2 as of November   She has poor management of her mealtime insulin both because of her diet being erratic and eating behaviors are  abnormal especially at night She is also getting high glycemic index foods at breakfast causing hyperglycemic episodes Also her compliance with mealtime insulin is variable She says her sugars may go up when she gets stressed Currently has a tendency to persistently high fasting readings and lower readings with some hypoglycemia recently before suppertime  Still not able to get an insulin pump because of not getting her C-peptide tested with a blood sugar under 200 as discussed She still needs significant guidance and education and day-to-day management of her diabetes and insulin adjustment She is still not counting carbohydrates even though this is going to be necessary for starting the pump    PLAN:   She will use 6 units of Lantus in the morning and 10 units at night  She will adjust the Lantus further in the evening if fasting readings are consistently high above 130 at least  Discussed postprandial targets  Also showed her the CGM results and the fact that she is not covering her breakfast and late evening snacks adequately with insulin  Needs to continue to work with her psychiatrist to control abnormal eating behaviors  She will start eating cereal in the morning and have a balanced meals with protein  Walking for exercise  She will try to use resources for carbohydrate counting  Also discussed options for various pumps with nurse educator today  Will need to be more active with her carbohydrate counting   Follow-up in 8 weeks She will be scheduled to get another C-peptide done and make sure that her blood sugar is well below 200 when she comes in  Counseling time on subjects discussed in assessment and plan sections is over 50% of today's 25 minute visit   Patient Instructions  Stop Cereal in ams  LANTUS  6 UNITS IN AM AND 10 IN PM  Adjust pm Lantus based on am sugar  ALWAYS BOLUS before the meal or snack        Elayne Snare 03/05/2018, 3:53 PM   Note:  This note was prepared with Dragon voice recognition system technology. Any transcriptional errors that result from this process are unintentional.  ADDENDUM:  Please note the patient is checking her blood sugar with contour meter twice a day usually  Elayne Snare

## 2018-03-05 ENCOUNTER — Ambulatory Visit (INDEPENDENT_AMBULATORY_CARE_PROVIDER_SITE_OTHER): Payer: Medicare Other | Admitting: Endocrinology

## 2018-03-05 ENCOUNTER — Encounter: Payer: Self-pay | Admitting: Endocrinology

## 2018-03-05 VITALS — BP 118/62 | HR 86 | Ht 62.0 in | Wt 137.0 lb

## 2018-03-05 DIAGNOSIS — E1065 Type 1 diabetes mellitus with hyperglycemia: Secondary | ICD-10-CM | POA: Diagnosis not present

## 2018-03-05 LAB — POCT GLYCOSYLATED HEMOGLOBIN (HGB A1C): Hemoglobin A1C: 7.8

## 2018-03-05 NOTE — Patient Instructions (Addendum)
Stop Cereal in ams  LANTUS 6 UNITS IN AM AND 10 IN PM  Adjust pm Lantus based on am sugar  ALWAYS BOLUS before the meal or snack

## 2018-03-07 DIAGNOSIS — M1990 Unspecified osteoarthritis, unspecified site: Secondary | ICD-10-CM | POA: Diagnosis not present

## 2018-03-07 DIAGNOSIS — M069 Rheumatoid arthritis, unspecified: Secondary | ICD-10-CM | POA: Diagnosis not present

## 2018-03-07 DIAGNOSIS — K219 Gastro-esophageal reflux disease without esophagitis: Secondary | ICD-10-CM | POA: Diagnosis not present

## 2018-03-07 DIAGNOSIS — M4322 Fusion of spine, cervical region: Secondary | ICD-10-CM | POA: Diagnosis not present

## 2018-03-07 DIAGNOSIS — M7711 Lateral epicondylitis, right elbow: Secondary | ICD-10-CM | POA: Diagnosis not present

## 2018-03-07 DIAGNOSIS — S53431S Radial collateral ligament sprain of right elbow, sequela: Secondary | ICD-10-CM | POA: Diagnosis not present

## 2018-03-07 DIAGNOSIS — Z79890 Hormone replacement therapy (postmenopausal): Secondary | ICD-10-CM | POA: Diagnosis not present

## 2018-03-07 DIAGNOSIS — Z888 Allergy status to other drugs, medicaments and biological substances status: Secondary | ICD-10-CM | POA: Diagnosis not present

## 2018-03-07 DIAGNOSIS — Z794 Long term (current) use of insulin: Secondary | ICD-10-CM | POA: Diagnosis not present

## 2018-03-07 DIAGNOSIS — M5412 Radiculopathy, cervical region: Secondary | ICD-10-CM | POA: Diagnosis not present

## 2018-03-07 DIAGNOSIS — F3189 Other bipolar disorder: Secondary | ICD-10-CM | POA: Diagnosis not present

## 2018-03-07 DIAGNOSIS — E1165 Type 2 diabetes mellitus with hyperglycemia: Secondary | ICD-10-CM | POA: Diagnosis not present

## 2018-03-07 DIAGNOSIS — M47812 Spondylosis without myelopathy or radiculopathy, cervical region: Secondary | ICD-10-CM | POA: Diagnosis not present

## 2018-03-07 DIAGNOSIS — Z91040 Latex allergy status: Secondary | ICD-10-CM | POA: Diagnosis not present

## 2018-03-07 DIAGNOSIS — Z91048 Other nonmedicinal substance allergy status: Secondary | ICD-10-CM | POA: Diagnosis not present

## 2018-03-07 DIAGNOSIS — G4733 Obstructive sleep apnea (adult) (pediatric): Secondary | ICD-10-CM | POA: Diagnosis not present

## 2018-03-07 DIAGNOSIS — Z79899 Other long term (current) drug therapy: Secondary | ICD-10-CM | POA: Diagnosis not present

## 2018-04-01 DIAGNOSIS — Z8669 Personal history of other diseases of the nervous system and sense organs: Secondary | ICD-10-CM | POA: Diagnosis not present

## 2018-04-01 DIAGNOSIS — H9212 Otorrhea, left ear: Secondary | ICD-10-CM | POA: Diagnosis not present

## 2018-04-02 DIAGNOSIS — Z794 Long term (current) use of insulin: Secondary | ICD-10-CM | POA: Diagnosis not present

## 2018-04-02 DIAGNOSIS — E1040 Type 1 diabetes mellitus with diabetic neuropathy, unspecified: Secondary | ICD-10-CM | POA: Diagnosis not present

## 2018-04-02 DIAGNOSIS — Z91048 Other nonmedicinal substance allergy status: Secondary | ICD-10-CM | POA: Diagnosis not present

## 2018-04-02 DIAGNOSIS — Z79899 Other long term (current) drug therapy: Secondary | ICD-10-CM | POA: Diagnosis not present

## 2018-04-02 DIAGNOSIS — M797 Fibromyalgia: Secondary | ICD-10-CM | POA: Diagnosis not present

## 2018-04-02 DIAGNOSIS — K8050 Calculus of bile duct without cholangitis or cholecystitis without obstruction: Secondary | ICD-10-CM | POA: Diagnosis not present

## 2018-04-02 DIAGNOSIS — K219 Gastro-esophageal reflux disease without esophagitis: Secondary | ICD-10-CM | POA: Diagnosis not present

## 2018-04-02 DIAGNOSIS — Z87442 Personal history of urinary calculi: Secondary | ICD-10-CM | POA: Diagnosis not present

## 2018-04-02 DIAGNOSIS — Z91040 Latex allergy status: Secondary | ICD-10-CM | POA: Diagnosis not present

## 2018-04-02 DIAGNOSIS — Z888 Allergy status to other drugs, medicaments and biological substances status: Secondary | ICD-10-CM | POA: Diagnosis not present

## 2018-04-02 DIAGNOSIS — Z961 Presence of intraocular lens: Secondary | ICD-10-CM | POA: Diagnosis not present

## 2018-04-02 DIAGNOSIS — G4733 Obstructive sleep apnea (adult) (pediatric): Secondary | ICD-10-CM | POA: Diagnosis not present

## 2018-04-04 ENCOUNTER — Telehealth: Payer: Self-pay

## 2018-04-04 NOTE — Telephone Encounter (Signed)
[employer] would like you to addend your last two office noted for this patient and correct it to say that the patient is testing her blood sugar via finger sticks at least two times daily.

## 2018-04-05 NOTE — Telephone Encounter (Signed)
Addendum has been done to both visits

## 2018-04-17 DIAGNOSIS — G4733 Obstructive sleep apnea (adult) (pediatric): Secondary | ICD-10-CM | POA: Diagnosis not present

## 2018-04-17 DIAGNOSIS — G4730 Sleep apnea, unspecified: Secondary | ICD-10-CM | POA: Diagnosis not present

## 2018-04-17 DIAGNOSIS — G4710 Hypersomnia, unspecified: Secondary | ICD-10-CM | POA: Diagnosis not present

## 2018-04-25 ENCOUNTER — Other Ambulatory Visit (INDEPENDENT_AMBULATORY_CARE_PROVIDER_SITE_OTHER): Payer: Medicare Other

## 2018-04-25 DIAGNOSIS — E1065 Type 1 diabetes mellitus with hyperglycemia: Secondary | ICD-10-CM

## 2018-04-25 LAB — BASIC METABOLIC PANEL
BUN: 15 mg/dL (ref 6–23)
CO2: 30 mEq/L (ref 19–32)
Calcium: 9.3 mg/dL (ref 8.4–10.5)
Chloride: 103 mEq/L (ref 96–112)
Creatinine, Ser: 0.81 mg/dL (ref 0.40–1.20)
GFR: 78.49 mL/min (ref >60.00)
Glucose, Bld: 151 mg/dL — ABNORMAL HIGH (ref 70–99)
POTASSIUM: 4.2 meq/L (ref 3.5–5.1)
SODIUM: 139 meq/L (ref 135–145)

## 2018-04-25 LAB — HEMOGLOBIN A1C: HEMOGLOBIN A1C: 8.1 % — AB (ref 4.6–6.5)

## 2018-04-26 LAB — C-PEPTIDE: C-Peptide: <0.1 ng/mL — ABNORMAL LOW (ref 1.1–4.4)

## 2018-04-30 ENCOUNTER — Ambulatory Visit: Payer: Medicare Other | Admitting: Endocrinology

## 2018-04-30 ENCOUNTER — Telehealth: Payer: Self-pay | Admitting: Nutrition

## 2018-04-30 DIAGNOSIS — R1013 Epigastric pain: Secondary | ICD-10-CM | POA: Diagnosis not present

## 2018-04-30 DIAGNOSIS — K602 Anal fissure, unspecified: Secondary | ICD-10-CM | POA: Diagnosis not present

## 2018-04-30 DIAGNOSIS — K5909 Other constipation: Secondary | ICD-10-CM | POA: Diagnosis not present

## 2018-04-30 NOTE — Telephone Encounter (Signed)
Patient wanted to know if her Medicare and [employer] would cover the Dexcom G6.  I gave her the telephone number to call to start the insurance investigation paperwork for this.

## 2018-05-02 ENCOUNTER — Encounter: Payer: Self-pay | Admitting: Endocrinology

## 2018-05-02 ENCOUNTER — Ambulatory Visit (INDEPENDENT_AMBULATORY_CARE_PROVIDER_SITE_OTHER): Payer: Medicare Other | Admitting: Endocrinology

## 2018-05-02 VITALS — BP 106/68 | HR 92 | Ht 62.0 in | Wt 136.4 lb

## 2018-05-02 DIAGNOSIS — E1065 Type 1 diabetes mellitus with hyperglycemia: Secondary | ICD-10-CM

## 2018-05-02 NOTE — Patient Instructions (Addendum)
Target sugar at bedtime  200, not 100  Cover all carbs by dividing by 15  Do not adjust nite Lantus based on sugar at time of shot but only based on 3 day trend  Need 630 pump

## 2018-05-02 NOTE — Progress Notes (Signed)
Patient ID: Sheryl Matthews, female   DOB: Apr 22, 1966, 52 y.o.   MRN: [id number]          Reason for Appointment : Follow-up for Type 1 Diabetes  History of Present Illness          Diagnosis: Type 1 diabetes mellitus, date of diagnosis:  at age 7    Previous history:   She has been on insulin since the time of diagnosis. She was on the same program of NPH and Regular Insulin since diagnosis was made when she was first seen here   Recent history:   INSULIN regimen is described as:  Lantus 8--10, Novolog 6-7 mostly before meals, using. 1:50 correction.  Her A1c is 8.1 and about the same as before   Glucose patterns, management and problems identified:    She has not use the Dexcom sensor for the last month and mostly using her contour meter  She still has marked variability in her blood sugars at all times, currently checking her blood sugar 5-6 times a day on an average  She has no consistent pattern and her blood sugars  HIGHEST blood sugars on average are in the morning hours even though she has increased her Lantus to the 10 units, not clear if sometimes she will take 12  She says she is adjusting her Lantus in the evening based on the blood sugar at the time of injection  However FASTING readings are occasionally as low as 69 and lowest reading overnight was 54 at 4:30 AM  MEALTIME doses: She is probably using 1: 15 coverage for her carbohydrate but only when she has the information available readily on a box  She may not get protein in the morning and may just have cereal and banana, occasionally may have low normal sugars at lunch because of this  Correction doses for high sugars is usually 1: 50 but she may take this amount after eating meals when the blood sugar is high which may occasionally result in overcorrection overnight  HYPOGLYCEMIA has been sporadic with occasional episodes in the early afternoon  Lowest blood sugar was 36, 2 nights ago and she thinks she  may have taken her NovoLog twice  HYPERGLYCEMIA is either related to not taking enough insulin for her meals but she also thinks that her blood sugars tend to go up when she is stressed; occasionally when she is busy she will not stop to take her insulin while eating     Mean values apply above for all meters except median for One Touch  PRE-MEAL Fasting  midday  2-6 PM  6 PM + Overall  Glucose range:  69-363  42-361  56-387  36-375   Mean/median:  219  157  201  170  184+/-98   Hypoglycemia:  Factors causing hyperglycemia: Reduced food intake, exercise, increased activity  Symptoms of hypoglycemia: Weakness, dizziness, difficulty thinking, feels sweaty and shaky only if blood sugar in the 30s.  May recognize symptoms low 60 at times but inconsistently.  Has had seizures during the night from hypoglycemia Treatment of hypoglycemia: juice, peanut butter, Glucagon      Diabetes labs:  Lab Results  Component Value Date   HGBA1C 8.1 (H) 04/25/2018   HGBA1C 7.8 03/05/2018   HGBA1C 8.2 (H) 10/24/2017   Lab Results  Component Value Date   MICROALBUR 1.0 10/24/2017   LDLCALC 103 (H) 10/24/2017   CREATININE 0.81 04/25/2018    Allergies as of 05/02/2018  Reactions   Latex Rash   Tape Rash   Elavil [amitriptyline Hcl]    Groggy/constipation/did not think worked    Lyrica [pregabalin]    Did not have benefit.  Falling asleep. Feeling depressed.      Medication List        Accurate as of 05/02/18  2:14 PM. Always use your most recent med list.          buPROPion 300 MG 24 hr tablet Commonly known as:  WELLBUTRIN XL Take 300 mg by mouth daily.   FLUoxetine 20 MG capsule Commonly known as:  PROZAC Take 4 capsules (80 mg total) by mouth daily.   fluticasone 50 MCG/ACT nasal spray Commonly known as:  FLONASE USE 1 SPRAY IN EACH NOSTRIL DAILY   FREESTYLE LITE test strip Generic drug:  glucose blood USE AS INSTRUCTED TO CHECK BLOOD SUGAR SIX TIMES PER DAY     gabapentin 300 MG capsule Commonly known as:  NEURONTIN TAKE 1 CAPSULE DAILY.   glucagon 1 MG injection Inject 1 mg intramuscularly for severe hypoglycemia   HAIR VITAMINS PO Take by mouth daily.   ibuprofen 200 MG tablet Commonly known as:  ADVIL,MOTRIN Take 200 mg by mouth as needed.   insulin aspart 100 UNIT/ML FlexPen Commonly known as:  NOVOLOG FLEXPEN Inject 6-7 units three times a day with meals   Insulin Pen Needle 32G X 4 MM Misc Commonly known as:  BD PEN NEEDLE NANO U/F Use 7 per day to inject insulin   KETOSTIX strip Generic drug:  acetone (urine) test USE TO CHECK URINE KETONES IF BLOOD SUGAR IS OVER 300 AS DIRECTED BY PHYSICIAN   LANTUS SOLOSTAR 100 UNIT/ML Solostar Pen Generic drug:  Insulin Glargine INJECT 20 UNITS UNDER THE SKIN TWICE A DAY   pantoprazole 40 MG tablet Commonly known as:  PROTONIX Take 40 mg by mouth daily.   PROBIOTIC DAILY PO Take by mouth.   ranitidine 300 MG tablet Commonly known as:  ZANTAC Take 300 mg by mouth daily.   VANICREAM EX Apply topically as needed.       Allergies:  Allergies  Allergen Reactions  . Latex Rash  . Tape Rash  . Elavil [Amitriptyline Hcl]     Groggy/constipation/did not think worked   . Lyrica [Pregabalin]     Did not have benefit.  Falling asleep. Feeling depressed.    Past Medical History:  Diagnosis Date  . Depression   . Diabetes mellitus without complication ([place])   . Pain of left breast     Past Surgical History:  Procedure Laterality Date  . BREAST EXCISIONAL BIOPSY Left     Family History  Problem Relation Age of Onset  . Depression Sister   . Alcohol abuse Brother   . Diabetes Brother   . Breast cancer Maternal Aunt     Social History:  reports that she has never smoked. She has never used smokeless tobacco. She reports that she does not drink alcohol or use drugs.    Review of Systems   ROS      She is followed by gastroenterologist for reflux and nausea  possibly related to biliary disease  Lipids: Borderline LDL, Not on any statins  Lab Results  Component Value Date   CHOL 185 10/24/2017   HDL 69.80 10/24/2017   LDLCALC 103 (H) 10/24/2017   TRIG 61.0 10/24/2017   CHOLHDL 3 10/24/2017    Depression: She is taking Wellbutrin and Prozac  She has a history of Numbness and, tingling in her feet      Physical Examination:  BP 106/68 (BP Location: Left Arm, Patient Position: Sitting, Cuff Size: Normal)   Pulse 92   Ht 5\' 2"  (1.575 m)   Wt 136 lb 6.4 oz (61.9 kg)   SpO2 96%   BMI 24.95 kg/m     ASSESSMENT:  Diabetes type 1, long-standing and persistently poorly controlled  See history of present illness for detailed discussion of current diabetes management, blood sugar patterns and problems identified  A1c is still high at 8.1  Without her Dexcom sensor use in the last month difficult to get a complete pattern of her blood sugars  She is still having difficulty managing her day-to-day insulin doses with the same problems as before Although her sugars are mostly higher in the morning she will randomly get relatively low sugars overnight also This may be however at times from overcorrection of high readings at night or taking extra Lantus when the blood sugars are higher  She also thinks that her hyperglycemia is related to stress, eating without bolusing or sometimes overeating She is probably not counting carbohydrates accurately at all times except when she is able to review carbohydrates from a box Breakfast may be relatively high in carbohydrate and low in protein with eating cereal frequently    PLAN:  She will continue the same basic dose of Lantus for now Only if her blood sugars are consistently high in the morning she will go up on the Lantus by 1 to 2 units She will try to get insulin pump with the [employer] 630 since eventually she will benefit considerably from using the 670 pump Explained to her that  this would be advantageous compared to the other pumps at this time She will also try to be more active with controlling excessive snacks and cover adequately for more carbohydrate intake after meals She will use a 1: 15 ratio for all meals To take insulin consistently for all meals including lunch Add protein for breakfast in the morning instead of just eating cereal Will need to see her back in 2 months to review her patterns since she is starting back on the Dexcom sensor since last night  Patient Instructions  Target sugar at bedtime  200, not 100  Cover all carbs by dividing by 15  Do not adjust nite Lantus based on sugar at time of shot but only based on 3 day trend  Need 630 pump     Counseling time on subjects discussed in assessment and plan sections is over 50% of today's 25 minute visit     Elayne Snare 05/02/2018, 2:14 PM   Note: This note was prepared with Dragon voice recognition system technology. Any transcriptional errors that result from this process are unintentional.    Elayne Snare

## 2018-05-03 ENCOUNTER — Telehealth: Payer: Self-pay | Admitting: Endocrinology

## 2018-05-03 NOTE — Telephone Encounter (Signed)
I called [employer] & they are faxing back over paperwork.

## 2018-05-03 NOTE — Telephone Encounter (Signed)
[employer] is calling to follow up on the certifacate of medical necessity that was faxed over on June 4th    Phone-[phone] Fax- [phone]

## 2018-05-25 DIAGNOSIS — Z91040 Latex allergy status: Secondary | ICD-10-CM | POA: Diagnosis not present

## 2018-05-25 DIAGNOSIS — Z791 Long term (current) use of non-steroidal anti-inflammatories (NSAID): Secondary | ICD-10-CM | POA: Diagnosis not present

## 2018-05-25 DIAGNOSIS — Z91048 Other nonmedicinal substance allergy status: Secondary | ICD-10-CM | POA: Diagnosis not present

## 2018-05-25 DIAGNOSIS — H1131 Conjunctival hemorrhage, right eye: Secondary | ICD-10-CM | POA: Diagnosis not present

## 2018-05-25 DIAGNOSIS — F319 Bipolar disorder, unspecified: Secondary | ICD-10-CM | POA: Diagnosis not present

## 2018-05-25 DIAGNOSIS — H538 Other visual disturbances: Secondary | ICD-10-CM | POA: Diagnosis not present

## 2018-05-25 DIAGNOSIS — M329 Systemic lupus erythematosus, unspecified: Secondary | ICD-10-CM | POA: Diagnosis not present

## 2018-05-25 DIAGNOSIS — Z888 Allergy status to other drugs, medicaments and biological substances status: Secondary | ICD-10-CM | POA: Diagnosis not present

## 2018-05-25 DIAGNOSIS — E1142 Type 2 diabetes mellitus with diabetic polyneuropathy: Secondary | ICD-10-CM | POA: Diagnosis not present

## 2018-05-25 DIAGNOSIS — Z794 Long term (current) use of insulin: Secondary | ICD-10-CM | POA: Diagnosis not present

## 2018-05-25 DIAGNOSIS — F418 Other specified anxiety disorders: Secondary | ICD-10-CM | POA: Diagnosis not present

## 2018-05-25 DIAGNOSIS — R51 Headache: Secondary | ICD-10-CM | POA: Diagnosis not present

## 2018-05-25 DIAGNOSIS — Z79899 Other long term (current) drug therapy: Secondary | ICD-10-CM | POA: Diagnosis not present

## 2018-06-05 DIAGNOSIS — G4733 Obstructive sleep apnea (adult) (pediatric): Secondary | ICD-10-CM | POA: Diagnosis not present

## 2018-06-05 DIAGNOSIS — G4710 Hypersomnia, unspecified: Secondary | ICD-10-CM | POA: Diagnosis not present

## 2018-06-05 DIAGNOSIS — G4730 Sleep apnea, unspecified: Secondary | ICD-10-CM | POA: Diagnosis not present

## 2018-07-09 ENCOUNTER — Other Ambulatory Visit: Payer: Medicare Other

## 2018-07-10 ENCOUNTER — Other Ambulatory Visit: Payer: Self-pay | Admitting: Endocrinology

## 2018-07-10 ENCOUNTER — Other Ambulatory Visit (HOSPITAL_COMMUNITY): Payer: Self-pay | Admitting: Psychiatry

## 2018-07-10 ENCOUNTER — Other Ambulatory Visit: Payer: Medicare Other

## 2018-07-10 DIAGNOSIS — E1065 Type 1 diabetes mellitus with hyperglycemia: Secondary | ICD-10-CM

## 2018-07-10 DIAGNOSIS — F3130 Bipolar disorder, current episode depressed, mild or moderate severity, unspecified: Secondary | ICD-10-CM

## 2018-07-11 ENCOUNTER — Ambulatory Visit: Payer: Medicare Other | Admitting: Endocrinology

## 2018-07-11 DIAGNOSIS — G4710 Hypersomnia, unspecified: Secondary | ICD-10-CM | POA: Diagnosis not present

## 2018-07-11 DIAGNOSIS — G4730 Sleep apnea, unspecified: Secondary | ICD-10-CM | POA: Diagnosis not present

## 2018-07-11 DIAGNOSIS — G4733 Obstructive sleep apnea (adult) (pediatric): Secondary | ICD-10-CM | POA: Diagnosis not present

## 2018-07-12 ENCOUNTER — Other Ambulatory Visit (INDEPENDENT_AMBULATORY_CARE_PROVIDER_SITE_OTHER): Payer: Medicare Other

## 2018-07-12 DIAGNOSIS — E1065 Type 1 diabetes mellitus with hyperglycemia: Secondary | ICD-10-CM

## 2018-07-12 LAB — LIPID PANEL
CHOLESTEROL: 187 mg/dL (ref 0–200)
HDL: 80.4 mg/dL (ref >39.00)
LDL CALC: 93 mg/dL (ref 0–99)
NonHDL: 106.25
Total CHOL/HDL Ratio: 2
Triglycerides: 65 mg/dL (ref 0.0–149.0)
VLDL: 13 mg/dL (ref 0.0–40.0)

## 2018-07-12 LAB — GLUCOSE, RANDOM: GLUCOSE: 241 mg/dL — AB (ref 70–99)

## 2018-07-13 LAB — FRUCTOSAMINE: Fructosamine: 347 umol/L — ABNORMAL HIGH (ref 0–285)

## 2018-07-22 DIAGNOSIS — F319 Bipolar disorder, unspecified: Secondary | ICD-10-CM | POA: Diagnosis not present

## 2018-07-22 NOTE — Progress Notes (Signed)
Patient ID: Sheryl Matthews, female   DOB: 1966/11/02, 52 y.o.   MRN: [id number]          Reason for Appointment : Follow-up for Type 1 Diabetes  History of Present Illness          Diagnosis: Type 1 diabetes mellitus, date of diagnosis:  at age 32    Previous history:   She has been on insulin since the time of diagnosis. She was on the same program of NPH and Regular Insulin since diagnosis was made when she was first seen here   Recent history:   INSULIN regimen is described as:  Lantus 8--8, Novolog 5-8 units,?  1: 10 carbohydrate ratio, using. 1:50 correction.  Her A1c was 8.1 and fructosamine is 347 about the same as before   Glucose patterns, management and problems identified:    She has again used the Dexcom sensor more recently  As explained below her blood sugars are still not well controlled with significant variability  Her main difficulty is estimating her carbohydrates correctly and probably overestimating what she needs to take especially at breakfast and suppertime causing relatively low sugars especially after supper  Because of not getting enough protein in the morning when she has cereal she has tendency to low sugars before lunch  Likely needs more basal insulin because of blood sugars going up late at night  Also may be getting snacks again at times especially late in the evening causing hyperglycemia    CONTINUOUS GLUCOSE MONITORING RECORD INTERPRETATION    Dates of Recording: 8/14 through 07/23/2018  Results statistics:   CGM use % of time  98  Average and SD  174+/-80  Time in range      51 %  % Time Above 180  43  % Time above 250  18  % Time Below target  6%, 2.3 below 54    Glycemic patterns summary:   Blood sugar tends to be the highest overnight, lowest before lunch and after suppertime and marked variability throughout the day.  Hypoglycemia according 6% of the time with very low blood sugars 2.3% of the time.  Also very high blood  sugars over 250 are occurring 18.3% of the time Her best blood sugar daily was 8/26 with blood sugar average 141 and 78% of the readings within target  Hyperglycemic episodes, most prominent overnight with blood sugars generally rising after about 10 PM and may continue to be high until after breakfast although this is mostly in the last week. Also has occasional hyperglycemic episodes midafternoon and before dinnertime  Hypoglycemic episodes occurred before lunch and after supper mostly  Overnight periods: Blood sugars are markedly increased with more consistent increase in the last week and average blood sugar around 200 and starting to decline only after about 8 AM when she has breakfast  Preprandial periods: Blood sugars are mostly high before breakfast, near target around lunchtime and on an average near 200 before dinner  Postprandial periods: After breakfast: Blood sugars are relatively flat overall coming down from the baseline high reading;  blood sugars after lunch: Show inconsistent rise in blood sugar at times with some blood sugar peaks around 3-4 PM After dinner: Blood sugars are generally lower in the hypoglycemic range about 2-3 times a week      Mean values apply above for all meters except median for One Touch  PRE-MEAL Fasting  midday  2-6 PM  6 PM + Overall  Glucose range:  69-363  42-361  56-387  36-375   Mean/median:  219  157  201  170  184+/-98   Hypoglycemia:  Factors causing hyperglycemia: Reduced food intake, exercise, increased activity  Symptoms of hypoglycemia: Weakness, dizziness, difficulty thinking, feels sweaty and shaky only if blood sugar in the 30s.  May recognize symptoms low 60 at times but inconsistently.  Has had seizures during the night from hypoglycemia Treatment of hypoglycemia: juice, peanut butter, Glucagon      Diabetes labs:  Lab Results  Component Value Date   HGBA1C 8.1 (H) 04/25/2018   HGBA1C 7.8 03/05/2018   HGBA1C 8.2 (H)  10/24/2017   Lab Results  Component Value Date   MICROALBUR 1.0 10/24/2017   LDLCALC 93 07/12/2018   CREATININE 0.81 04/25/2018    Lab Results  Component Value Date   FRUCTOSAMINE 347 (H) 07/12/2018   FRUCTOSAMINE 340 (H) 06/11/2017     Allergies as of 07/23/2018      Reactions   Latex Rash   Tape Rash   Elavil [amitriptyline Hcl]    Groggy/constipation/did not think worked    Lyrica [pregabalin]    Did not have benefit.  Falling asleep. Feeling depressed.      Medication List        Accurate as of 07/23/18  8:23 AM. Always use your most recent med list.          buPROPion 300 MG 24 hr tablet Commonly known as:  WELLBUTRIN XL Take 300 mg by mouth daily.   FLUoxetine 20 MG capsule Commonly known as:  PROZAC Take 4 capsules (80 mg total) by mouth daily.   fluticasone 50 MCG/ACT nasal spray Commonly known as:  FLONASE USE 1 SPRAY IN EACH NOSTRIL DAILY   FREESTYLE LITE test strip Generic drug:  glucose blood USE AS INSTRUCTED TO CHECK BLOOD SUGAR SIX TIMES PER DAY   gabapentin 300 MG capsule Commonly known as:  NEURONTIN TAKE 1 CAPSULE DAILY.   glucagon 1 MG injection INJECT 1 MG INTO THE MUSCLE FOR SEVERE HYPOGLYCEMIA   HAIR VITAMINS PO Take by mouth daily.   ibuprofen 200 MG tablet Commonly known as:  ADVIL,MOTRIN Take 200 mg by mouth as needed.   Insulin Pen Needle 32G X 4 MM Misc Use 7 per day to inject insulin   KETOSTIX strip Generic drug:  acetone (urine) test USE TO CHECK URINE KETONES IF BLOOD SUGAR IS OVER 300 AS DIRECTED BY PHYSICIAN   LANTUS SOLOSTAR 100 UNIT/ML Solostar Pen Generic drug:  Insulin Glargine INJECT 20 UNITS UNDER THE SKIN TWICE A DAY   NOVOLOG FLEXPEN 100 UNIT/ML FlexPen Generic drug:  insulin aspart INJECT 6 TO 7 UNITS THREE TIMES A DAY WITH MEALS   pantoprazole 40 MG tablet Commonly known as:  PROTONIX Take 40 mg by mouth daily.   PROBIOTIC DAILY PO Take by mouth.   ranitidine 300 MG tablet Commonly known  as:  ZANTAC Take 300 mg by mouth daily.   VANICREAM EX Apply topically as needed.       Allergies:  Allergies  Allergen Reactions  . Latex Rash  . Tape Rash  . Elavil [Amitriptyline Hcl]     Groggy/constipation/did not think worked   . Lyrica [Pregabalin]     Did not have benefit.  Falling asleep. Feeling depressed.    Past Medical History:  Diagnosis Date  . Depression   . Diabetes mellitus without complication ([place])   . Pain of left breast     Past Surgical History:  Procedure Laterality Date  . BREAST EXCISIONAL BIOPSY Left     Family History  Problem Relation Age of Onset  . Depression Sister   . Alcohol abuse Brother   . Diabetes Brother   . Breast cancer Maternal Aunt     Social History:  reports that she has never smoked. She has never used smokeless tobacco. She reports that she does not drink alcohol or use drugs.    Review of Systems   ROS      She is followed by gastroenterologist for reflux and nausea possibly related to biliary disease  Lipids: Borderline LDL, Not on any statins  Lab Results  Component Value Date   CHOL 187 07/12/2018   HDL 80.40 07/12/2018   LDLCALC 93 07/12/2018   TRIG 65.0 07/12/2018   CHOLHDL 2 07/12/2018    Depression: She is taking Wellbutrin and Prozac      She has a history of Numbness and, tingling in her feet      Physical Examination:  BP 124/78 (BP Location: Left Arm, Patient Position: Sitting, Cuff Size: Normal)   Pulse 81   Ht 5\' 2"  (1.575 m)   Wt 138 lb 3.2 oz (62.7 kg)   SpO2 99%   BMI 25.28 kg/m     ASSESSMENT:  Diabetes type 1, long-standing and persistently poorly controlled  See history of present illness for detailed discussion of current diabetes management, blood sugar patterns and problems identified  Her fructosamine still indicates overall high readings, last A1c 8.1  With Lantus twice a day and the same doses both morning and evening her blood sugars are mostly higher  overnight since she has reduced her evening Lantus by 2 units She is not looking at her blood sugar patterns and has reduced her dose for evening Lantus because of lower readings after supper This is from overestimating her carbohydrates Not clear how accurately she is counting carbohydrates and needs more education regarding this Although she is supposed to use 1: 15 carbohydrate coverage ratio she is probably using 1: 10 coverage which is excessive at least at breakfast and suppertime Also without any protein in the morning she can still get low sugars before lunchtime   PLAN:    She will see the dietitian for meal planning and carbohydrate counting She will have a protein with breakfast daily Most likely with her current breakfast she will only need 3 to 4 units NovoLog coverage Start using 1: 15 carbohydrate coverage and look up carbohydrate counting information consistently as previously discussed She will go up to 9 units of Lantus in the evening and if morning sugars are still high go up to 10 units at least in the evening Regular exercise Her insulin pump needs to be started and she will schedule with the nurse educator, discussed that this would be advantageous with reducing variability and low blood sugars as well as more accurate mealtime coverage and correction of high readings   Patient Instructions  Lantus 9 or 10 in pm  Divide carbs by 15  Have a protein at Breakfast daily  Take 3-4 Novolog in am       Counseling time on subjects discussed in assessment and plan sections is over 50% of today's 25 minute visit     Elayne Snare 07/23/2018, 8:23 AM   Note: This note was prepared with Dragon voice recognition system technology. Any transcriptional errors that result from this process are unintentional.    Elayne Snare

## 2018-07-23 ENCOUNTER — Ambulatory Visit (INDEPENDENT_AMBULATORY_CARE_PROVIDER_SITE_OTHER): Payer: Medicare Other | Admitting: Endocrinology

## 2018-07-23 ENCOUNTER — Encounter: Payer: Self-pay | Admitting: Endocrinology

## 2018-07-23 VITALS — BP 124/78 | HR 81 | Ht 62.0 in | Wt 138.2 lb

## 2018-07-23 DIAGNOSIS — Z23 Encounter for immunization: Secondary | ICD-10-CM | POA: Diagnosis not present

## 2018-07-23 DIAGNOSIS — E1065 Type 1 diabetes mellitus with hyperglycemia: Secondary | ICD-10-CM | POA: Diagnosis not present

## 2018-07-23 NOTE — Patient Instructions (Signed)
Lantus 9 or 10 in pm  Divide carbs by 15  Have a protein at Breakfast daily  Take 3-4 Novolog in am

## 2018-07-25 DIAGNOSIS — E109 Type 1 diabetes mellitus without complications: Secondary | ICD-10-CM | POA: Diagnosis not present

## 2018-07-25 DIAGNOSIS — Z961 Presence of intraocular lens: Secondary | ICD-10-CM | POA: Diagnosis not present

## 2018-07-25 DIAGNOSIS — H43812 Vitreous degeneration, left eye: Secondary | ICD-10-CM | POA: Diagnosis not present

## 2018-07-25 DIAGNOSIS — H527 Unspecified disorder of refraction: Secondary | ICD-10-CM | POA: Diagnosis not present

## 2018-07-25 LAB — HM DIABETES EYE EXAM

## 2018-08-02 ENCOUNTER — Encounter: Payer: Self-pay | Admitting: Physician Assistant

## 2018-08-05 ENCOUNTER — Encounter: Payer: Medicare Other | Attending: Nutrition | Admitting: Nutrition

## 2018-08-05 ENCOUNTER — Telehealth: Payer: Self-pay | Admitting: Nutrition

## 2018-08-05 NOTE — Telephone Encounter (Signed)
Patient called back requesting to start this pump in November.  Message sent to [facility] to see if we can work her into dr Ronnie Derby schedule the first week of November

## 2018-08-05 NOTE — Telephone Encounter (Signed)
Opened in error

## 2018-08-05 NOTE — Telephone Encounter (Signed)
Patient was called to see why she missed her appointment to start on her insulin pump. She said she is having second thoughts about this, plus she does not feel well today.  Says she called and notified the front desk this AM. I tried to convince her that we can do this very slowly, and that she can stop this and go back to injections if she finds it overwhelming, but she refused to come in.

## 2018-08-06 ENCOUNTER — Ambulatory Visit: Payer: Medicare Other | Admitting: Endocrinology

## 2018-08-07 ENCOUNTER — Ambulatory Visit: Payer: Medicare Other | Admitting: Endocrinology

## 2018-08-08 ENCOUNTER — Ambulatory Visit: Payer: Medicare Other | Admitting: Endocrinology

## 2018-09-23 DIAGNOSIS — F319 Bipolar disorder, unspecified: Secondary | ICD-10-CM | POA: Diagnosis not present

## 2018-09-27 ENCOUNTER — Telehealth: Payer: Self-pay | Admitting: Endocrinology

## 2018-09-27 NOTE — Telephone Encounter (Signed)
Patient called stating they had second thoughts about starting pump. Stated they would no longer like to because they are getting rashes from the [facility] Tape and does not want to deal with it anymore. Appointments have been cancelled. Patient is still coming in on 11/5 to see Dr. Dwyane Dee.

## 2018-09-30 ENCOUNTER — Encounter: Payer: Medicare Other | Admitting: Nutrition

## 2018-10-01 ENCOUNTER — Ambulatory Visit (INDEPENDENT_AMBULATORY_CARE_PROVIDER_SITE_OTHER): Payer: Medicare Other | Admitting: Endocrinology

## 2018-10-01 ENCOUNTER — Encounter: Payer: Self-pay | Admitting: Endocrinology

## 2018-10-01 VITALS — BP 122/70 | HR 68 | Ht 62.0 in | Wt 138.0 lb

## 2018-10-01 DIAGNOSIS — E1065 Type 1 diabetes mellitus with hyperglycemia: Secondary | ICD-10-CM | POA: Diagnosis not present

## 2018-10-01 LAB — POCT GLYCOSYLATED HEMOGLOBIN (HGB A1C): Hemoglobin A1C: 7.8 % — AB (ref 4.0–5.6)

## 2018-10-01 LAB — GLUCOSE, POCT (MANUAL RESULT ENTRY): POC Glucose: 119 mg/dl — AB (ref 70–99)

## 2018-10-01 NOTE — Patient Instructions (Addendum)
LANTUS 10 UNITS in pm and get am sugar between 100-150  Divide Carbs by 15

## 2018-10-01 NOTE — Progress Notes (Signed)
Patient ID: Sheryl Matthews, female   DOB: 28-Feb-1966, 52 y.o.   MRN: [id number]          Reason for Appointment : Follow-up for Type 1 Diabetes  History of Present Illness          Diagnosis: Type 1 diabetes mellitus, date of diagnosis:  at age 21    Previous history:   She has been on insulin since the time of diagnosis. She was on the same program of NPH and Regular Insulin since diagnosis was made when she was first seen here   Recent history:   INSULIN regimen is described as:  Lantus 8--8, Novolog 5-8 units,?  1: 10 carbohydrate ratio, using. 1:50 correction.  Her A1c was 8.1 previously and now 7.8   Glucose patterns, management and problems identified:    She has been advised to start the Medtronic pump but is hesitating to do this because of not wanting to have another item on her skin and she thinks she may be getting some rashes with the sensor although previously had not complained about this  She is still not estimating her carbohydrates accurately and tends to overdo her boluses for meals in the mornings including today when she took 8 units for a 60 g carbohydrate breakfast, was advised to take 1: 15 ratio  She says that if her blood sugars are normal she will not take a mealtime injection  She was told to see the dietitian for carbohydrate counting instruction but she did not make an appointment  She forgot to increase her LANTUS in the evening as instructed and is still taking only 7-8 units  She does not understand the need to increase her evening Lantus to get her morning sugars down  Recently FASTING readings are consistently high   CONTINUOUS GLUCOSE MONITORING RECORD INTERPRETATION    Dates of Recording: 10/23 through 11/5  Sensor description: Dexcom  Results statistics:   CGM use % of time  96  Average and SD  195+/-87  Time in range     42   %  % Time Above 180  54  % Time above 250  27  % Time Below target  6    Glycemic patterns summary:  Blood sugars show considerable variability at all times with significantly high readings between about 2 AM and 9 AM and variable readings the rest of the day including some hypoglycemia  Hyperglycemic episodes are occurring very frequently overnight Also has hypoglycemic episodes at various other times including after meals with occasionally very high readings in the late afternoon  Hypoglycemic episodes occurred occasionally around noon with at least 3 episodes in the last week and twice the previously Also has had a couple of low sugar episodes around 9 PM  Overnight periods: Blood sugars are the highest overall during the night with sometimes very significant hyperglycemia after 3 AM. On average blood sugars are the highest around 7-8 AM   Preprandial periods: Blood sugars are mostly high before breakfast, low normal or low at lunchtime and again variable at dinnertime although better in the last week  Postprandial periods: After breakfast: Blood sugars are usually coming down significantly by lunchtime    After lunch: Blood sugars may be occasionally rising significantly but not consistently    After dinner: Has had significant hyperglycemia 3-4 occasions in the last 2 weeks but otherwise blood sugars fairly well controlled and also sometimes low normal   Fingerstick glucose statistics:    PRE-MEAL  mornings Lunch Dinner  6-12 AM Overall  Glucose range:  82-365  64-315  74-448  39-528   Mean/median:  214  180  225  211    POST-MEAL PC Breakfast PC Lunch PC Dinner  Glucose range:     Mean/median:         Hypoglycemia:  Factors causing hyperglycemia: Reduced food intake, exercise, increased activity  Symptoms of hypoglycemia: Weakness, dizziness, difficulty thinking, feels sweaty and shaky only if blood sugar in the 30s.  May recognize symptoms low 60 at times but inconsistently.  Has had seizures during the night from hypoglycemia Treatment of hypoglycemia: juice, peanut  butter, Glucagon      Diabetes labs:  Lab Results  Component Value Date   HGBA1C 7.8 (A) 10/01/2018   HGBA1C 8.1 (H) 04/25/2018   HGBA1C 7.8 03/05/2018   Lab Results  Component Value Date   MICROALBUR 1.0 10/24/2017   LDLCALC 93 07/12/2018   CREATININE 0.81 04/25/2018    Lab Results  Component Value Date   FRUCTOSAMINE 347 (H) 07/12/2018   FRUCTOSAMINE 340 (H) 06/11/2017     Allergies as of 10/01/2018      Reactions   Latex Rash   Tape Rash   Elavil [amitriptyline Hcl]    Groggy/constipation/did not think worked    Lyrica [pregabalin]    Did not have benefit.  Falling asleep. Feeling depressed.      Medication List        Accurate as of 10/01/18  8:57 PM. Always use your most recent med list.          buPROPion 300 MG 24 hr tablet Commonly known as:  WELLBUTRIN XL Take 300 mg by mouth daily.   FLUoxetine 20 MG capsule Commonly known as:  PROZAC Take 4 capsules (80 mg total) by mouth daily.   fluticasone 50 MCG/ACT nasal spray Commonly known as:  FLONASE USE 1 SPRAY IN EACH NOSTRIL DAILY   FREESTYLE LITE test strip Generic drug:  glucose blood USE AS INSTRUCTED TO CHECK BLOOD SUGAR SIX TIMES PER DAY   gabapentin 300 MG capsule Commonly known as:  NEURONTIN TAKE 1 CAPSULE DAILY.   glucagon 1 MG injection INJECT 1 MG INTO THE MUSCLE FOR SEVERE HYPOGLYCEMIA   HAIR VITAMINS PO Take by mouth daily.   ibuprofen 200 MG tablet Commonly known as:  ADVIL,MOTRIN Take 200 mg by mouth as needed.   Insulin Pen Needle 32G X 4 MM Misc Use 7 per day to inject insulin   KETOSTIX strip Generic drug:  acetone (urine) test USE TO CHECK URINE KETONES IF BLOOD SUGAR IS OVER 300 AS DIRECTED BY PHYSICIAN   LANTUS SOLOSTAR 100 UNIT/ML Solostar Pen Generic drug:  Insulin Glargine INJECT 20 UNITS UNDER THE SKIN TWICE A DAY   NOVOLOG FLEXPEN 100 UNIT/ML FlexPen Generic drug:  insulin aspart INJECT 6 TO 7 UNITS THREE TIMES A DAY WITH MEALS   pantoprazole 40 MG  tablet Commonly known as:  PROTONIX Take 40 mg by mouth daily.   PROBIOTIC DAILY PO Take by mouth.   ranitidine 300 MG tablet Commonly known as:  ZANTAC Take 300 mg by mouth daily.   VANICREAM EX Apply topically as needed.       Allergies:  Allergies  Allergen Reactions  . Latex Rash  . Tape Rash  . Elavil [Amitriptyline Hcl]     Groggy/constipation/did not think worked   . Lyrica [Pregabalin]     Did not have benefit.  Falling asleep. Feeling depressed.  Past Medical History:  Diagnosis Date  . Depression   . Diabetes mellitus without complication ([place])   . Pain of left breast     Past Surgical History:  Procedure Laterality Date  . BREAST EXCISIONAL BIOPSY Left     Family History  Problem Relation Age of Onset  . Depression Sister   . Alcohol abuse Brother   . Diabetes Brother   . Breast cancer Maternal Aunt     Social History:  reports that she has never smoked. She has never used smokeless tobacco. She reports that she does not drink alcohol or use drugs.    Review of Systems   ROS      She is followed by gastroenterologist for reflux and nausea possibly related to biliary disease  Lipids: Borderline LDL, Not on any statins  Lab Results  Component Value Date   CHOL 187 07/12/2018   HDL 80.40 07/12/2018   LDLCALC 93 07/12/2018   TRIG 65.0 07/12/2018   CHOLHDL 2 07/12/2018    Depression: She is taking Wellbutrin and Prozac      She has a history of Numbness and, tingling in her feet      Physical Examination:  BP 122/70   Pulse 68   Ht 5\' 2"  (1.575 m)   Wt 138 lb (62.6 kg)   SpO2 97%   BMI 25.24 kg/m     ASSESSMENT:  Diabetes type 1, long-standing and persistently poorly controlled  See history of present illness for detailed discussion of current diabetes management, blood sugar patterns and problems identified  Blood sugars are still not improved and recently averaging nearly 200 on her sensor with marked  variability as above  She is not getting enough basal insulin in the evening Also even though she has been recommended insulin pump she is having various fears about starting this and these were discussed including local reactions, wearing the pump on her body Discussed the advantages of much better control with the pump especially overnight with the closed-loop system    PLAN:    She will see the nurse educator to discuss the pros and cons of the insulin pump and to answer any questions she has She will need to increase her evening Lantus by at least 2 units and increase further to get her morning sugars more consistently between 100 and 150 To be consistent with 1: 15 carbohydrate coverage Have a protein at breakfast consistently Continues to take mealtime injection regardless of pre-meal blood sugar and if she is afraid of low sugars second dose postprandially To start insulin pump after discussion with nurse educator Discussed blood sugar targets at various times   Patient Instructions  LANTUS 10 UNITS in pm and get am sugar between 100-150  Divide Carbs by 15    Counseling time on subjects discussed in assessment and plan sections is over 50% of today's 25 minute visit     Elayne Snare 10/01/2018, 8:57 PM   Note: This note was prepared with Dragon voice recognition system technology. Any transcriptional errors that result from this process are unintentional.    Elayne Snare

## 2018-10-02 ENCOUNTER — Ambulatory Visit: Payer: Medicare Other | Admitting: Endocrinology

## 2018-10-03 ENCOUNTER — Ambulatory Visit: Payer: Medicare Other | Admitting: Endocrinology

## 2018-10-07 ENCOUNTER — Encounter: Payer: Medicare Other | Admitting: Nutrition

## 2018-10-08 ENCOUNTER — Telehealth: Payer: Self-pay | Admitting: Endocrinology

## 2018-10-08 ENCOUNTER — Ambulatory Visit: Payer: Medicare Other | Admitting: Endocrinology

## 2018-10-08 NOTE — Telephone Encounter (Signed)
Office notes have been faxed to dexcom

## 2018-10-08 NOTE — Telephone Encounter (Signed)
[name] with [name] called re: he needs chart notes that are within 6 months of November so that the patient can reorder necessary supplies. Please call [name] at ph# [phone] OR Fax to # [phone]

## 2018-10-09 DIAGNOSIS — G47419 Narcolepsy without cataplexy: Secondary | ICD-10-CM | POA: Diagnosis not present

## 2018-10-09 DIAGNOSIS — G4733 Obstructive sleep apnea (adult) (pediatric): Secondary | ICD-10-CM | POA: Diagnosis not present

## 2018-10-09 DIAGNOSIS — G4710 Hypersomnia, unspecified: Secondary | ICD-10-CM | POA: Diagnosis not present

## 2018-10-14 ENCOUNTER — Telehealth: Payer: Self-pay

## 2018-10-14 ENCOUNTER — Other Ambulatory Visit: Payer: Self-pay

## 2018-10-14 NOTE — Telephone Encounter (Signed)
I can't see where pended but ok to send.

## 2018-10-14 NOTE — Telephone Encounter (Signed)
Brenetta called today wanting a referral to Neu[facility] Dr. Elisabeth Cara. I have went ahead and placed order for referral, and have pended it. Just wanted to let you know. Thanks!

## 2018-10-15 ENCOUNTER — Other Ambulatory Visit: Payer: Self-pay

## 2018-10-15 ENCOUNTER — Telehealth: Payer: Self-pay | Admitting: Endocrinology

## 2018-10-15 ENCOUNTER — Encounter: Payer: Medicare Other | Admitting: Nutrition

## 2018-10-15 ENCOUNTER — Ambulatory Visit: Payer: Medicare Other | Admitting: Nutrition

## 2018-10-15 DIAGNOSIS — M542 Cervicalgia: Secondary | ICD-10-CM

## 2018-10-15 NOTE — Telephone Encounter (Signed)
Patient arrived at 11:00 for her appointment today that is at 2:15. Patient was unable to come and that time and cancelled. Patient stated to call to reschedule. Thanks!

## 2018-10-16 NOTE — Telephone Encounter (Signed)
I phoned her to appologize for the confusion yesterday at the front desk.  REscheduled appointment for next Wednesday.

## 2018-10-18 DIAGNOSIS — E103293 Type 1 diabetes mellitus with mild nonproliferative diabetic retinopathy without macular edema, bilateral: Secondary | ICD-10-CM | POA: Diagnosis not present

## 2018-10-18 DIAGNOSIS — H43812 Vitreous degeneration, left eye: Secondary | ICD-10-CM | POA: Diagnosis not present

## 2018-10-18 LAB — HM DIABETES EYE EXAM

## 2018-10-23 ENCOUNTER — Encounter: Payer: Medicare Other | Attending: Endocrinology | Admitting: Nutrition

## 2018-10-23 DIAGNOSIS — E1065 Type 1 diabetes mellitus with hyperglycemia: Secondary | ICD-10-CM

## 2018-10-23 NOTE — Progress Notes (Signed)
Patient is here to be trained on her Medtronic 630G pump. She is doing normal saline to see if she will be able to do this and like it. Settings were put into the pump per Dr. Ronnie Derby orders:  Basal rate: 0.5u/hr except from 7AM to 12 N it is 0.4.  I/C ratio: 15, ISF:50 timing 4 hours, target 120.   She was shown how to bolus, and redemonstrated this correctlyX2.  She is wearing a Dexcom sensor, and we discussed the difference between sensor readings and blood sugar readings, and when she needs to check her blood sugar.  She reported good understanding of this.  We also discussed the need for the Medtronic sensor to use the suspend before low feature for her.  She reported good understanding of this, but wants to wait to see if she will continue with this pump, before ordering the sensor. We also discussed the idea of IOB, and she reported good understanding of this as well, with no final questions.  She will call me next week to let me know if she is wanting to proceed with this pump.

## 2018-10-23 NOTE — Patient Instructions (Signed)
Give boluses with pump, as directed, but continue to take your insulin injections of both the Lantus and Humalog

## 2018-10-30 ENCOUNTER — Telehealth: Payer: Self-pay | Admitting: Nutrition

## 2018-10-30 NOTE — Telephone Encounter (Signed)
Patient says she does not like the pump.  "too much to learn and too hard to understand.  She plans to talk to Dr. Dwyane Dee at her next appointment.

## 2018-10-31 ENCOUNTER — Ambulatory Visit: Payer: Medicare Other | Admitting: Family Medicine

## 2018-10-31 DIAGNOSIS — R1310 Dysphagia, unspecified: Secondary | ICD-10-CM | POA: Diagnosis not present

## 2018-10-31 DIAGNOSIS — R109 Unspecified abdominal pain: Secondary | ICD-10-CM | POA: Diagnosis not present

## 2018-10-31 DIAGNOSIS — K5900 Constipation, unspecified: Secondary | ICD-10-CM | POA: Diagnosis not present

## 2018-10-31 DIAGNOSIS — K219 Gastro-esophageal reflux disease without esophagitis: Secondary | ICD-10-CM | POA: Diagnosis not present

## 2018-11-05 ENCOUNTER — Other Ambulatory Visit: Payer: Self-pay

## 2018-11-05 ENCOUNTER — Telehealth: Payer: Self-pay

## 2018-11-05 ENCOUNTER — Telehealth: Payer: Self-pay | Admitting: Endocrinology

## 2018-11-05 ENCOUNTER — Other Ambulatory Visit: Payer: Self-pay | Admitting: Physician Assistant

## 2018-11-05 DIAGNOSIS — N6320 Unspecified lump in the left breast, unspecified quadrant: Secondary | ICD-10-CM

## 2018-11-05 DIAGNOSIS — F3112 Bipolar disorder, current episode manic without psychotic features, moderate: Secondary | ICD-10-CM

## 2018-11-05 MED ORDER — INSULIN ASPART (W/NIACINAMIDE) 100 UNIT/ML ~~LOC~~ SOPN: 6.0000 [IU] | PEN_INJECTOR | Freq: Three times a day (TID) | SUBCUTANEOUS | 3 refills | Status: DC

## 2018-11-05 NOTE — Telephone Encounter (Signed)
Please send prescription for Fiasp FlexPen instead of NovoLog, same dose needs

## 2018-11-05 NOTE — Telephone Encounter (Signed)
Rx sent 

## 2018-11-05 NOTE — Telephone Encounter (Signed)
Referrals placed 

## 2018-11-05 NOTE — Telephone Encounter (Signed)
Sheryl Matthews would like to switch to a psychiatrist instead of the NP she sees now. She would also like to a referral to the same location for therapy.   Diagnosis - Bipolar 1 disorder    Arfeen Arlyce Harman MD Psychiatrist in [place], [place]  Address: [address], [place], [place] [address] Phone: [phone] Fax [phone]

## 2018-11-05 NOTE — Telephone Encounter (Signed)
Patient stated she would like to see if she could be switched to a fast acting insulin due to her sugars being high.  She said she could come in sooner than Feb to discuss this with him if needed.   Please advise

## 2018-11-13 DIAGNOSIS — G47419 Narcolepsy without cataplexy: Secondary | ICD-10-CM | POA: Diagnosis not present

## 2018-11-13 DIAGNOSIS — G4710 Hypersomnia, unspecified: Secondary | ICD-10-CM | POA: Diagnosis not present

## 2018-11-14 ENCOUNTER — Telehealth: Payer: Self-pay

## 2018-11-14 NOTE — Telephone Encounter (Signed)
Received fax from [employer] stating that patient was approved for Fiasp Flextouch Insulin Pen.  Approval effective through 11/26/2098.

## 2018-11-19 DIAGNOSIS — G4710 Hypersomnia, unspecified: Secondary | ICD-10-CM | POA: Diagnosis not present

## 2018-11-19 DIAGNOSIS — G4733 Obstructive sleep apnea (adult) (pediatric): Secondary | ICD-10-CM | POA: Diagnosis not present

## 2018-11-25 ENCOUNTER — Ambulatory Visit (INDEPENDENT_AMBULATORY_CARE_PROVIDER_SITE_OTHER): Payer: Medicare Other | Admitting: Obstetrics & Gynecology

## 2018-11-25 ENCOUNTER — Encounter: Payer: Self-pay | Admitting: Obstetrics & Gynecology

## 2018-11-25 VITALS — BP 108/69 | HR 99 | Resp 16 | Ht 62.0 in | Wt 140.0 lb

## 2018-11-25 DIAGNOSIS — R102 Pelvic and perineal pain: Secondary | ICD-10-CM | POA: Diagnosis not present

## 2018-11-25 DIAGNOSIS — Z800 Family history of malignant neoplasm of digestive organs: Secondary | ICD-10-CM | POA: Diagnosis not present

## 2018-11-25 DIAGNOSIS — Z0000 Encounter for general adult medical examination without abnormal findings: Secondary | ICD-10-CM

## 2018-11-25 NOTE — Progress Notes (Signed)
Patient ID: Sheryl Matthews, female   DOB: October 15, 1966, 52 y.o.   MRN: [id number]  Chief Complaint  Patient presents with  . Pelvic cramping    HPI Jurney Overacker is a 52 y.o. married P1 (10 yo daughter) here today as a new patient with the issue of lower pelvic cramping, all over but worse on left. This started about a month ago.Nothing makes it better or worse. She reports that IBU400 mg helps some but she doesn't like taking meds. She has not had a period for a year. This feels like period cramping. She has always had dyspareunia.   Past Medical History:  Diagnosis Date  . Bipolar 1 disorder ([place])   . Deafness in left ear   . Depression   . Diabetes mellitus without complication ([place])   . Osteoarthritis   . Pain of left breast   . Skin disorder     Past Surgical History:  Procedure Laterality Date  . ANKLE FRACTURE SURGERY Left   . BREAST ENHANCEMENT SURGERY    . BREAST EXCISIONAL BIOPSY Left   . CERVICAL DISC SURGERY    . CESAREAN SECTION    . cosmetic surgery facial    . ELBOW SURGERY Right   . GALLBLADDER SURGERY    . INNER EAR SURGERY    . MINOR CARPAL TUNNEL Left     Family History  Problem Relation Age of Onset  . Depression Sister   . Alcohol abuse Brother   . Diabetes Brother   . Depression Brother   . Breast cancer Maternal Aunt     Social History Social History   Tobacco Use  . Smoking status: Never Smoker  . Smokeless tobacco: Never Used  Substance Use Topics  . Alcohol use: Yes    Alcohol/week: 0.0 standard drinks    Comment: rarely  . Drug use: No    Allergies  Allergen Reactions  . Latex Rash  . Tape Rash  . Elavil [Amitriptyline Hcl]     Groggy/constipation/did not think worked   . Lyrica [Pregabalin]     Did not have benefit.  Falling asleep. Feeling depressed.    Current Outpatient Medications  Medication Sig Dispense Refill  . buPROPion (WELLBUTRIN XL) 300 MG 24 hr tablet Take 300 mg by mouth daily.    Marland Kitchen FLUoxetine (PROZAC) 20 MG  capsule Take 4 capsules (80 mg total) by mouth daily. (Patient taking differently: Take 80 mg by mouth daily. ) 360 capsule 0  . fluticasone (FLONASE) 50 MCG/ACT nasal spray USE 1 SPRAY IN EACH NOSTRIL DAILY 48 g 1  . FREESTYLE LITE test strip USE AS INSTRUCTED TO CHECK BLOOD SUGAR SIX TIMES PER DAY 600 each 1  . gabapentin (NEURONTIN) 300 MG capsule TAKE 1 CAPSULE DAILY. 90 capsule 0  . glucagon (GLUCAGON EMERGENCY) 1 MG injection INJECT 1 MG INTO THE MUSCLE FOR SEVERE HYPOGLYCEMIA 4 each 3  . ibuprofen (ADVIL,MOTRIN) 200 MG tablet Take 200 mg by mouth as needed.    . Insulin Aspart, w/Niacinamide, (FIASP FLEXTOUCH) 100 UNIT/ML SOPN Inject 6-7 Units into the skin 3 (three) times daily. Inject 6-7 units under the skin 3 times daily with meals. 9 pen 3  . Insulin Pen Needle (BD PEN NEEDLE NANO U/F) 32G X 4 MM MISC Use 7 per day to inject insulin 550 each 1  . KETOSTIX strip USE TO CHECK URINE KETONES IF BLOOD SUGAR IS OVER 300 AS DIRECTED BY PHYSICIAN 100 each 2  . LANTUS SOLOSTAR 100 UNIT/ML Solostar Pen  INJECT 20 UNITS UNDER THE SKIN TWICE A DAY 45 mL 1  . Multiple Vitamins-Minerals (HAIR VITAMINS PO) Take by mouth daily.    . pantoprazole (PROTONIX) 40 MG tablet Take 40 mg by mouth daily.    . Probiotic Product (PROBIOTIC DAILY PO) Take by mouth.    . ranitidine (ZANTAC) 300 MG tablet Take 300 mg by mouth daily.    Marland Kitchen ALPRAZolam (XANAX) 0.5 MG tablet 1 tablet    . Emollient (VANICREAM EX) Apply topically as needed.     No current facility-administered medications for this visit.     Review of Systems Review of Systems  Her last pap was at [facility] about a year ago.  Married for 22 years She is a stay at home spouse. Mammogram due FH- + breast- maternal aunt, no gyn, + colon cancer in maternal GF  Blood pressure 108/69, pulse 99, resp. rate 16, height 5\' 2"  (1.575 m), weight 140 lb (63.5 kg), last menstrual period 11/25/2017.  Physical Exam Physical Exam Breathing, conversing, and  ambulating normally  Data Reviewed Mammogram and labs  Assessment    Pelvic pain FH of cancers    Plan    Schedule gyn u/s Schedule annual at which time we will discuss her u/s results [facility] today        Axyl Sitzman C Yarieliz Wasser 11/25/2018, 1:24 PM

## 2018-11-28 ENCOUNTER — Encounter: Payer: Self-pay | Admitting: Physician Assistant

## 2018-11-29 ENCOUNTER — Other Ambulatory Visit: Payer: Self-pay

## 2018-11-29 ENCOUNTER — Ambulatory Visit: Payer: Medicare Other | Admitting: Psychology

## 2018-11-29 ENCOUNTER — Telehealth: Payer: Self-pay

## 2018-11-29 NOTE — Telephone Encounter (Signed)
Called patient to follow-up on neurology referral. Patient states the neurology office she sees was requesting an updated referral. I placed the referral for her. No further questions or concerns at this time.

## 2018-12-03 ENCOUNTER — Telehealth: Payer: Self-pay | Admitting: Physician Assistant

## 2018-12-03 ENCOUNTER — Ambulatory Visit: Payer: Medicare Other

## 2018-12-03 NOTE — Telephone Encounter (Signed)
Pt called and wanted referral to Dr. Gloriann Loan neurosurgeon for neck pain. He alerted our office we would have to get MRI before he would see her. Per insurance she is going to have to come in for a visit and do the complete work up before they will approve MRI. That means xray and PT etc. Please call and make appt.

## 2018-12-04 NOTE — Telephone Encounter (Signed)
Called and left a voicemail for patient to call back to obtain information. Office contact information provided.

## 2018-12-10 DIAGNOSIS — R1310 Dysphagia, unspecified: Secondary | ICD-10-CM | POA: Diagnosis not present

## 2018-12-12 NOTE — Telephone Encounter (Signed)
Called and left patient a voicemail explaining the situation. I provided the office's contact information so she can call back and schedule an appointment.

## 2018-12-16 DIAGNOSIS — F319 Bipolar disorder, unspecified: Secondary | ICD-10-CM | POA: Diagnosis not present

## 2018-12-23 DIAGNOSIS — K5900 Constipation, unspecified: Secondary | ICD-10-CM | POA: Diagnosis not present

## 2018-12-23 DIAGNOSIS — K219 Gastro-esophageal reflux disease without esophagitis: Secondary | ICD-10-CM | POA: Diagnosis not present

## 2018-12-23 DIAGNOSIS — R109 Unspecified abdominal pain: Secondary | ICD-10-CM | POA: Diagnosis not present

## 2018-12-23 DIAGNOSIS — R1032 Left lower quadrant pain: Secondary | ICD-10-CM | POA: Diagnosis not present

## 2018-12-23 DIAGNOSIS — R102 Pelvic and perineal pain: Secondary | ICD-10-CM | POA: Diagnosis not present

## 2018-12-23 LAB — BASIC METABOLIC PANEL: Glucose: 341

## 2018-12-24 ENCOUNTER — Ambulatory Visit (INDEPENDENT_AMBULATORY_CARE_PROVIDER_SITE_OTHER): Payer: Medicare Other | Admitting: Physician Assistant

## 2018-12-24 ENCOUNTER — Encounter: Payer: Self-pay | Admitting: Physician Assistant

## 2018-12-24 VITALS — BP 113/77 | HR 92 | Temp 98.0°F | Ht 62.0 in | Wt 141.0 lb

## 2018-12-24 DIAGNOSIS — K5904 Chronic idiopathic constipation: Secondary | ICD-10-CM | POA: Diagnosis not present

## 2018-12-24 DIAGNOSIS — K5909 Other constipation: Secondary | ICD-10-CM | POA: Insufficient documentation

## 2018-12-24 DIAGNOSIS — R109 Unspecified abdominal pain: Secondary | ICD-10-CM | POA: Diagnosis not present

## 2018-12-24 DIAGNOSIS — R350 Frequency of micturition: Secondary | ICD-10-CM | POA: Diagnosis not present

## 2018-12-24 DIAGNOSIS — K5900 Constipation, unspecified: Secondary | ICD-10-CM | POA: Diagnosis not present

## 2018-12-24 DIAGNOSIS — R1032 Left lower quadrant pain: Secondary | ICD-10-CM | POA: Diagnosis not present

## 2018-12-24 LAB — POCT URINALYSIS DIPSTICK
Bilirubin, UA: NEGATIVE
Glucose, UA: POSITIVE — AB
Ketones, UA: NEGATIVE
LEUKOCYTES UA: NEGATIVE
Nitrite, UA: NEGATIVE
Protein, UA: NEGATIVE
RBC UA: NEGATIVE
Spec Grav, UA: 1.01 (ref 1.010–1.025)
Urobilinogen, UA: 0.2 E.U./dL
pH, UA: 7.5 (ref 5.0–8.0)

## 2018-12-24 NOTE — Telephone Encounter (Signed)
Can we abstract her diabetes information from endocrinology?

## 2018-12-24 NOTE — Progress Notes (Signed)
Subjective:    Patient ID: Sheryl Matthews, female    DOB: 01-08-1966, 53 y.o.   MRN: [id number]  HPI  Pt is a 53 yo T1DM with hx of constipation who presents to the clinic with lower abdominal pressure and urinary frequency. She would like to make sure she does not have a UTI. No dysuria, fever, chills. She does have some right flank pain and nausea. She has had symptoms for 3 days.   She sees GI for constipation,Dr. Pleasant. CT done this morning.   Result Impression  IMPRESSION: 1. Large amount of stool and contrast within the colon. 2.  Mild relative wall thickening of the stomach which is nonspecific but raises the possibility for gastritis or possibly mass/focal inflammatory change especially of the cardia/fundus. Follow-up exam with possible EGD could be obtained as indicated.   Last full bowel movement was days ago. She did a small hard stool yesterday.   T1DM- managed by endocrinology.   .. Active Ambulatory Problems    Diagnosis Date Noted  . Bipolar 1 disorder with moderate mania ([place]) 11/02/2014  . Mild nonproliferative diabetic retinopathy ([place]) 12/22/2014  . Pseudophakia of both eyes 12/22/2014  . Abdominal pain 06/07/2015  . Internal hemorrhoids 08/16/2015  . Uncontrolled type 1 diabetes mellitus with hyperglycemia ([place]) 09/15/2015  . HAV (hallux abducto valgus) 12/20/2015  . Adductovarus rotation of toe, acquired 12/20/2015  . Painful breasts 06/14/2016  . History of fibrocystic disease of breast 06/14/2016  . Left breast mass 07/12/2016  . Fibromyalgia 12/26/2016  . Vitamin D insufficiency 01/30/2017  . Low iron stores 01/30/2017  . SOB (shortness of breath) 02/02/2017  . No energy 02/02/2017  . Chest tightness 02/02/2017  . Weakness 02/02/2017  . RLS (restless legs syndrome) 12/18/2017   Resolved Ambulatory Problems    Diagnosis Date Noted  . Type I diabetes mellitus with peripheral autonomic neuropathy ([place]) 11/02/2014   Past Medical History:   Diagnosis Date  . Bipolar 1 disorder ([place])   . Deafness in left ear   . Depression   . Diabetes mellitus without complication ([place])   . Osteoarthritis   . Pain of left breast   . Skin disorder      Review of Systems  All other systems reviewed and are negative.      Objective:   Physical Exam Vitals signs reviewed.  Constitutional:      Appearance: Normal appearance.  HENT:     Head: Normocephalic and atraumatic.  Cardiovascular:     Rate and Rhythm: Normal rate and regular rhythm.     Pulses: Normal pulses.  Abdominal:     General: There is distension.     Tenderness: There is abdominal tenderness.     Comments: Decreased bowel sounds.  Distended abdomen with mild discomfort with palpation of lower bilateral abdominal quadrants. No guarding or rebound.   Neurological:     Mental Status: She is alert.  Psychiatric:        Mood and Affect: Mood normal.           Assessment & Plan:  Marland KitchenMarland KitchenDiagnoses and all orders for this visit:  Urinary frequency -     POCT Urinalysis Dipstick -     Urine Culture  Chronic idiopathic constipation     .Marland Kitchen Results for orders placed or performed in visit on 12/24/18  POCT Urinalysis Dipstick  Result Value Ref Range   Color, UA yellow    Clarity, UA clear    Glucose, UA Positive (A)  Negative   Bilirubin, UA negative    Ketones, UA negative    Spec Grav, UA 1.010 1.010 - 1.025   Blood, UA negative    pH, UA 7.5 5.0 - 8.0   Protein, UA Negative Negative   Urobilinogen, UA 0.2 0.2 or 1.0 E.U./dL   Nitrite, UA negative    Leukocytes, UA Negative Negative   Appearance     Odor     No signs of urinary tract infection. Will culture to confirm.   Viewed CT scan. No stones. Large stool burden.   Discussed with patient likely constipation causing symptoms. Continue with GI cleanse.   Follow up as needed.

## 2018-12-25 LAB — URINE CULTURE
MICRO NUMBER:: 115389
SPECIMEN QUALITY:: ADEQUATE

## 2018-12-27 NOTE — Progress Notes (Signed)
Call pt: no sign of urinary tract infection.

## 2018-12-30 ENCOUNTER — Other Ambulatory Visit: Payer: Medicare Other

## 2019-01-03 ENCOUNTER — Ambulatory Visit: Payer: Medicare Other | Admitting: Endocrinology

## 2019-01-06 DIAGNOSIS — R1310 Dysphagia, unspecified: Secondary | ICD-10-CM | POA: Diagnosis not present

## 2019-01-06 DIAGNOSIS — R933 Abnormal findings on diagnostic imaging of other parts of digestive tract: Secondary | ICD-10-CM | POA: Diagnosis not present

## 2019-01-06 DIAGNOSIS — K2971 Gastritis, unspecified, with bleeding: Secondary | ICD-10-CM | POA: Diagnosis not present

## 2019-01-06 DIAGNOSIS — K219 Gastro-esophageal reflux disease without esophagitis: Secondary | ICD-10-CM | POA: Diagnosis not present

## 2019-01-08 ENCOUNTER — Telehealth: Payer: Self-pay

## 2019-01-08 NOTE — Telephone Encounter (Addendum)
Spoke with pt and she is aware of this information ----- Message from Emily Filbert, MD sent at 01/08/2019  8:36 AM EST ----- Please let her know that her test showed a Variant of Uncertain significance. I have spoken with the breast cancer expert Dr. Gunnar Bulla Magrinaut who assures me that these results are "unactionable". That means that no extra testing is currently recommended. Thanks

## 2019-01-13 ENCOUNTER — Other Ambulatory Visit: Payer: Medicare Other

## 2019-01-14 DIAGNOSIS — M150 Primary generalized (osteo)arthritis: Secondary | ICD-10-CM | POA: Diagnosis not present

## 2019-01-14 DIAGNOSIS — M797 Fibromyalgia: Secondary | ICD-10-CM | POA: Diagnosis not present

## 2019-01-17 ENCOUNTER — Other Ambulatory Visit: Payer: Medicare Other

## 2019-01-21 ENCOUNTER — Ambulatory Visit: Payer: Medicare Other | Admitting: Endocrinology

## 2019-01-24 ENCOUNTER — Other Ambulatory Visit: Payer: Self-pay | Admitting: Physician Assistant

## 2019-01-31 ENCOUNTER — Telehealth: Payer: Self-pay

## 2019-01-31 ENCOUNTER — Other Ambulatory Visit: Payer: Self-pay

## 2019-01-31 ENCOUNTER — Other Ambulatory Visit (INDEPENDENT_AMBULATORY_CARE_PROVIDER_SITE_OTHER): Payer: Medicare Other

## 2019-01-31 DIAGNOSIS — E1065 Type 1 diabetes mellitus with hyperglycemia: Secondary | ICD-10-CM

## 2019-01-31 LAB — COMPREHENSIVE METABOLIC PANEL
ALT: 15 U/L (ref 0–35)
AST: 16 U/L (ref 0–37)
Albumin: 4 g/dL (ref 3.5–5.2)
Alkaline Phosphatase: 100 U/L (ref 39–117)
BUN: 17 mg/dL (ref 6–23)
CO2: 29 mEq/L (ref 19–32)
Calcium: 9 mg/dL (ref 8.4–10.5)
Chloride: 100 mEq/L (ref 96–112)
Creatinine, Ser: 0.77 mg/dL (ref 0.40–1.20)
GFR: 78.07 mL/min (ref >60.00)
Glucose, Bld: 273 mg/dL — ABNORMAL HIGH (ref 70–99)
Potassium: 4.6 mEq/L (ref 3.5–5.1)
Sodium: 134 mEq/L — ABNORMAL LOW (ref 135–145)
TOTAL PROTEIN: 6.7 g/dL (ref 6.0–8.3)
Total Bilirubin: 0.4 mg/dL (ref 0.2–1.2)

## 2019-01-31 LAB — HEMOGLOBIN A1C: Hgb A1c MFr Bld: 8 % — ABNORMAL HIGH (ref 4.6–6.5)

## 2019-01-31 MED ORDER — INSULIN ASPART (W/NIACINAMIDE) 100 UNIT/ML ~~LOC~~ SOPN: 6.0000 [IU] | PEN_INJECTOR | Freq: Three times a day (TID) | SUBCUTANEOUS | 3 refills | Status: DC

## 2019-01-31 NOTE — Telephone Encounter (Signed)
Pt is requesting a refill of Novolog for insulin pump, but not currently on her [place]. Can you please provide the maximum daily dose?

## 2019-01-31 NOTE — Telephone Encounter (Signed)
She is using Fiasp with a pen

## 2019-01-31 NOTE — Telephone Encounter (Signed)
Rx sent 

## 2019-02-03 ENCOUNTER — Ambulatory Visit (INDEPENDENT_AMBULATORY_CARE_PROVIDER_SITE_OTHER): Payer: Medicare Other | Admitting: Endocrinology

## 2019-02-03 ENCOUNTER — Encounter: Payer: Self-pay | Admitting: Endocrinology

## 2019-02-03 ENCOUNTER — Other Ambulatory Visit: Payer: Self-pay

## 2019-02-03 VITALS — BP 118/70 | HR 90 | Ht 62.0 in | Wt 140.0 lb

## 2019-02-03 DIAGNOSIS — E1065 Type 1 diabetes mellitus with hyperglycemia: Secondary | ICD-10-CM | POA: Diagnosis not present

## 2019-02-03 LAB — GLUCOSE, POCT (MANUAL RESULT ENTRY): POC Glucose: 118 mg/dl — AB (ref 70–99)

## 2019-02-03 MED ORDER — INSULIN ASPART (W/NIACINAMIDE) 100 UNIT/ML ~~LOC~~ SOPN: 7.0000 [IU] | PEN_INJECTOR | Freq: Three times a day (TID) | SUBCUTANEOUS | 1 refills | Status: DC

## 2019-02-03 MED ORDER — INSULIN GLARGINE 100 UNIT/ML SOLOSTAR PEN: PEN_INJECTOR | SUBCUTANEOUS | 1 refills | Status: DC

## 2019-02-03 NOTE — Patient Instructions (Addendum)
Take Lantus 7 in am and 10 in pm  And get am sugar about 130                        Take Fiasp just AFTER eating lunch and dinner  Am: eat cereal before exercise  Check blood sugars before meals  Also check blood sugars about 2 hours after meals and do this after different meals by rotation  Recommended blood sugar levels on waking up are 90-140 and about 2 hours after meal is 130-180

## 2019-02-03 NOTE — Progress Notes (Signed)
Patient ID: Sheryl Matthews, female   DOB: November 24, 1966, 53 y.o.   MRN: [id number]          Reason for Appointment : Follow-up for Type 1 Diabetes  History of Present Illness          Diagnosis: Type 1 diabetes mellitus, date of diagnosis:  at age 69    Previous history:   She has been on insulin since the time of diagnosis. She was on the same program of NPH and Regular Insulin since diagnosis was made when she was first seen here   Recent history:   INSULIN regimen is described as:  Lantus 8 units twice a day, Novolog 5-8 units,  1: 10 carbohydrate ratio, using. 1:50 correction.  Her A1c was 8.1 previously and now 8   Glucose patterns, management and problems identified:    She has again declined to use the insulin pump as she thought it was too complicated for her  She has not been following her instructions for her basal insulin adjustment and still taking 8 units of Lantus in the evening instead of the recommended 10 or more  HIGHEST blood sugars are overnight and on an average consistently over 180  On analysis her blood sugars are in target only 44% was arranged with mostly high readings between 1 80 and 250  POSTPRANDIAL readings are relatively higher after breakfast and periodically after evening meal  Also may occasionally have significantly high reading before dinnertime possibly from not covering afternoon snacks  She says that in the morning she is eating yogurt, cereal and fruit and is trying to eat more carbohydrate because of periodically exercising around 10 AM and to prevent hypoglycemia  Mealtime insulin: She is apparently taking mostly 8 units of NovoLog on Fiasp and not always proportional to what she is eating.  She will be afraid of eating too much and not taking enough insulin; still does not understand the concept of taking insulin right after eating  She does not think she is seeing any difference between Fiasp and NovoLog   Hypoglycemic episodes have  been transient but occur more often in the early afternoon especially in the last week Also has had occasional low sugars after her evening meal  This afternoon even though she did not have hypoglycemia in the office she thought she was feeling her blood sugar dropping and wanted to have snacks and drink a Coke to overcome the symptoms Factors causing hyperglycemia: Overestimating mealtime bolus , occasionally over correcting high sugars in the mornings and occasionally over correcting postprandial hyperglycemia; sometimes with exercise  Symptoms of hypoglycemia: Weakness, dizziness, difficulty thinking, feels sweaty and shaky only if blood sugar in the 30s.  May recognize symptoms low 60 at times but inconsistently.  Has had seizures during the night from hypoglycemia Treatment of hypoglycemia: juice, peanut butter, Glucagon     CGM use % of time  98  2-week average/SD  190+/-71  Time in range    44    %  % Time Above 180  53  % Time above 250  21  % Time Below 70  4      Diabetes labs:  Lab Results  Component Value Date   HGBA1C 8.0 (H) 01/31/2019   HGBA1C 7.8 (A) 10/01/2018   HGBA1C 8.1 (H) 04/25/2018   Lab Results  Component Value Date   MICROALBUR 1.0 10/24/2017   LDLCALC 93 07/12/2018   CREATININE 0.77 01/31/2019    Lab Results  Component Value  Date   FRUCTOSAMINE 347 (H) 07/12/2018   FRUCTOSAMINE 340 (H) 06/11/2017     Allergies as of 02/03/2019      Reactions   Latex Rash   Tape Rash   Elavil [amitriptyline Hcl]    Groggy/constipation/did not think worked    Lyrica [pregabalin]    Did not have benefit.  Falling asleep. Feeling depressed.      Medication List       Accurate as of February 03, 2019  8:30 PM. Always use your most recent med list.        ALPRAZolam 0.5 MG tablet Commonly known as:  XANAX 1 tablet   buPROPion 300 MG 24 hr tablet Commonly known as:  WELLBUTRIN XL Take 300 mg by mouth daily.   FLUoxetine 20 MG capsule Commonly known as:   PROZAC Take 4 capsules (80 mg total) by mouth daily.   fluticasone 50 MCG/ACT nasal spray Commonly known as:  FLONASE USE 1 SPRAY IN EACH NOSTRIL DAILY   FREESTYLE LITE test strip Generic drug:  glucose blood USE AS INSTRUCTED TO CHECK BLOOD SUGAR SIX TIMES PER DAY   gabapentin 300 MG capsule Commonly known as:  NEURONTIN TAKE 1 CAPSULE DAILY.   glucagon 1 MG injection Commonly known as:  Glucagon Emergency INJECT 1 MG INTO THE MUSCLE FOR SEVERE HYPOGLYCEMIA   HAIR VITAMINS PO Take by mouth daily.   ibuprofen 200 MG tablet Commonly known as:  ADVIL,MOTRIN Take 200 mg by mouth as needed.   Insulin Aspart (w/Niacinamide) 100 UNIT/ML Sopn Commonly known as:  Fiasp FlexTouch Inject 7-8 Units into the skin 3 (three) times daily. Inject 7-8 units under the skin three times daily before meals.   Insulin Glargine 100 UNIT/ML Solostar Pen Commonly known as:  Lantus SoloStar INJECT 7 UNITS UNDER THE SKIN IN THE MORNING, AND 8 UNITS IN THE EVENING.   Insulin Pen Needle 32G X 4 MM Misc Commonly known as:  BD Pen Needle Nano U/F Use 7 per day to inject insulin   Ketostix strip Generic drug:  acetone (urine) test USE TO CHECK URINE KETONES IF BLOOD SUGAR IS OVER 300 AS DIRECTED BY PHYSICIAN   pantoprazole 40 MG tablet Commonly known as:  PROTONIX Take 40 mg by mouth daily.   PROBIOTIC DAILY PO Take by mouth.   ranitidine 300 MG tablet Commonly known as:  ZANTAC Take 300 mg by mouth daily.   VANICREAM EX Apply topically as needed.       Allergies:  Allergies  Allergen Reactions  . Latex Rash  . Tape Rash  . Elavil [Amitriptyline Hcl]     Groggy/constipation/did not think worked   . Lyrica [Pregabalin]     Did not have benefit.  Falling asleep. Feeling depressed.    Past Medical History:  Diagnosis Date  . Bipolar 1 disorder ([place])   . Deafness in left ear   . Depression   . Diabetes mellitus without complication ([place])   . Osteoarthritis   . Pain of left  breast   . Skin disorder     Past Surgical History:  Procedure Laterality Date  . ANKLE FRACTURE SURGERY Left   . BREAST ENHANCEMENT SURGERY    . BREAST EXCISIONAL BIOPSY Left   . CERVICAL DISC SURGERY    . CESAREAN SECTION    . cosmetic surgery facial    . ELBOW SURGERY Right   . GALLBLADDER SURGERY    . INNER EAR SURGERY    . MINOR CARPAL TUNNEL Left  Family History  Problem Relation Age of Onset  . Depression Sister   . Alcohol abuse Brother   . Diabetes Brother   . Depression Brother   . Breast cancer Maternal Aunt     Social History:  reports that she has never smoked. She has never used smokeless tobacco. She reports current alcohol use. She reports that she does not use drugs.    Review of Systems   ROS      She is followed by gastroenterologist for reflux and nausea possibly related to biliary disease  Lipids: Borderline LDL, Not on any statins  Lab Results  Component Value Date   CHOL 187 07/12/2018   HDL 80.40 07/12/2018   LDLCALC 93 07/12/2018   TRIG 65.0 07/12/2018   CHOLHDL 2 07/12/2018    Depression: She is taking Wellbutrin and Prozac      She has a history of Numbness and, tingling in her feet      Physical Examination:  BP 118/70 (BP Location: Left Arm, Patient Position: Sitting, Cuff Size: Normal)   Pulse 90   Ht 5\' 2"  (1.575 m)   Wt 140 lb (63.5 kg)   LMP 01/09/2018   SpO2 99%   BMI 25.61 kg/m     ASSESSMENT:  Diabetes type 1, long-standing and persistently poorly controlled  See history of present illness for detailed discussion of current diabetes management, blood sugar patterns and problems identified  Her A1c is 8%  She is still not getting enough basal insulin in the evening She is refusing to use the insulin pump even though she did obtain this Even with her Dexcom she is not able to analyze and understand when she is having high blood sugars Persistently higher blood sugars during the night are likely  causing her high A1c She is periodically over estimating how much she is eating and may periodically get low blood sugars after lunch and dinner However not understanding the need to prevent exercise related hypoglycemia by having extra snacks right before eating instead of earlier in the morning when she is getting too much carbohydrate Although she does need more diabetes education if she does not want to do so  PLAN:    She will be given written instructions on her changes since she is not able to remember the adjustments that need to be made She will increase her Lantus to 10 units in the evening at least and if morning sugars are still at least over 140 to go up 1 more unit She will reduce Lantus to 7 units in the morning She will switch to [facility] and take it right after eating after actually assessing her carbohydrate intake She will cut back on carbohydrate at breakfast and have her cereal before going for exercise at 10 AM Discussed postprandial blood sugar targets She will avoid correcting postprandial readings unless well over 200 Treat hypoglycemia with simple sugars and not food   Patient Instructions  Take Lantus 7 in am and 10 in pm  And get am sugar about 130                        Take Fiasp just AFTER eating lunch and dinner  Am: eat cereal before exercise  Check blood sugars before meals  Also check blood sugars about 2 hours after meals and do this after different meals by rotation  Recommended blood sugar levels on waking up are 90-140 and about 2 hours after meal is  130-180        Counseling time on subjects discussed in assessment and plan sections is over 50% of today's 25 minute visit     Elayne Snare 02/03/2019, 8:30 PM   Note: This note was prepared with Dragon voice recognition system technology. Any transcriptional errors that result from this process are unintentional.    Elayne Snare

## 2019-02-04 ENCOUNTER — Other Ambulatory Visit (HOSPITAL_COMMUNITY): Payer: Self-pay | Admitting: Psychiatry

## 2019-02-04 DIAGNOSIS — F3130 Bipolar disorder, current episode depressed, mild or moderate severity, unspecified: Secondary | ICD-10-CM

## 2019-02-05 DIAGNOSIS — M79642 Pain in left hand: Secondary | ICD-10-CM | POA: Diagnosis not present

## 2019-02-05 DIAGNOSIS — K5909 Other constipation: Secondary | ICD-10-CM | POA: Diagnosis not present

## 2019-02-05 DIAGNOSIS — K219 Gastro-esophageal reflux disease without esophagitis: Secondary | ICD-10-CM | POA: Diagnosis not present

## 2019-02-11 ENCOUNTER — Other Ambulatory Visit: Payer: Self-pay

## 2019-02-11 MED ORDER — INSULIN GLARGINE 100 UNIT/ML SOLOSTAR PEN: PEN_INJECTOR | SUBCUTANEOUS | 1 refills | Status: DC

## 2019-02-11 MED ORDER — INSULIN ASPART (W/NIACINAMIDE) 100 UNIT/ML ~~LOC~~ SOPN: 7.0000 [IU] | PEN_INJECTOR | Freq: Three times a day (TID) | SUBCUTANEOUS | 1 refills | Status: DC

## 2019-02-11 NOTE — Telephone Encounter (Signed)
Rx sent 

## 2019-02-11 NOTE — Telephone Encounter (Signed)
Per [facility], "Caller is needing refill on her insulin."

## 2019-02-12 ENCOUNTER — Telehealth: Payer: Self-pay

## 2019-02-12 ENCOUNTER — Other Ambulatory Visit: Payer: Self-pay

## 2019-02-12 ENCOUNTER — Telehealth: Payer: Self-pay | Admitting: Endocrinology

## 2019-02-12 MED ORDER — INSULIN ASPART 100 UNIT/ML FLEXPEN: 7.0000 [IU] | PEN_INJECTOR | Freq: Three times a day (TID) | SUBCUTANEOUS | 3 refills | Status: DC

## 2019-02-12 NOTE — Telephone Encounter (Signed)
Pt is requesting permission to change Fiasp to Novolog. Pt stated that she does not like the s/e of Fiasp.

## 2019-02-12 NOTE — Telephone Encounter (Signed)
Pt stated that a side effect of Fiasp according to the package insert is weight gain, and this is the side effect she is experiencing.

## 2019-02-12 NOTE — Telephone Encounter (Signed)
Okay to switch to NovoLog

## 2019-02-12 NOTE — Telephone Encounter (Signed)
Fiasp should not cause side effects, please clarify

## 2019-02-12 NOTE — Telephone Encounter (Signed)
Rx sent 

## 2019-02-12 NOTE — Telephone Encounter (Signed)
error 

## 2019-02-12 NOTE — Telephone Encounter (Signed)
Pt stated that if Dr. Dwyane Dee wants to call her to discuss this, she would be okay with that, but pt is currently refusing to take Fiasp.

## 2019-02-12 NOTE — Telephone Encounter (Signed)
That can happen with any insulin and the composition of [place]ib[facility] is the same as NovoLog, I like her to try it because it is faster acting

## 2019-02-17 ENCOUNTER — Ambulatory Visit: Payer: Self-pay | Admitting: Physician Assistant

## 2019-02-17 ENCOUNTER — Ambulatory Visit (INDEPENDENT_AMBULATORY_CARE_PROVIDER_SITE_OTHER): Payer: Medicare Other | Admitting: Physician Assistant

## 2019-02-17 ENCOUNTER — Other Ambulatory Visit: Payer: Self-pay

## 2019-02-17 ENCOUNTER — Encounter: Payer: Self-pay | Admitting: Physician Assistant

## 2019-02-17 ENCOUNTER — Telehealth: Payer: Self-pay | Admitting: Family Medicine

## 2019-02-17 VITALS — BP 127/68 | HR 97 | Temp 98.8°F | Wt 138.2 lb

## 2019-02-17 DIAGNOSIS — J0100 Acute maxillary sinusitis, unspecified: Secondary | ICD-10-CM

## 2019-02-17 MED ORDER — AZITHROMYCIN 250 MG PO TABS: 250.0000 mg | ORAL_TABLET | Freq: Every day | ORAL | 0 refills | Status: DC

## 2019-02-17 MED ORDER — AMOXICILLIN-POT CLAVULANATE 875-125 MG PO TABS: 1.0000 | ORAL_TABLET | Freq: Two times a day (BID) | ORAL | 0 refills | Status: DC

## 2019-02-17 NOTE — Progress Notes (Signed)
..  Virtual Visit via Telephone Note  I connected with Sheryl Matthews on 02/17/19 at 10:30 AM EDT by telephone and verified that I am speaking with the correct person using two identifiers.   I discussed the limitations, risks, security and privacy concerns of performing an evaluation and management service by telephone and the availability of in person appointments. I also discussed with the patient that there may be a patient responsible charge related to this service. The patient expressed understanding and agreed to proceed.   History of Present Illness: Pt is a 53 yo female with SLE and type I diabetes who calls in with 2 weeks of sinus pressure, nasal congestion, headache, dry cough. She has a hx of allergies and taking OTC claritin. Denies any SOB, body aches or fever. No travel or contact with anyone who has traveled.    Observations/Objective: No acute distress .Marland Kitchen Today's Vitals   02/17/19 1147  BP: 127/68  Pulse: 97  Temp: 98.8 F (37.1 C)  Weight: 138 lb 3.2 oz (62.7 kg)   Body mass index is 25.28 kg/m.   Assessment and Plan: Marland KitchenMarland KitchenDiagnoses and all orders for this visit:  Acute non-recurrent maxillary sinusitis -     amoxicillin-clavulanate (AUGMENTIN) 875-125 MG tablet; Take 1 tablet by mouth 2 (two) times daily. For 10 days.   2 weeks of symptoms treated with augmentin and told to use flonase. Continue on allergy medication. Consider nasal saline rinses. Follow up as needed.   Reassured symptoms are not consistent with COVID.  Follow Up Instructions:    I discussed the assessment and treatment plan with the patient. The patient was provided an opportunity to ask questions and all were answered. The patient agreed with the plan and demonstrated an understanding of the instructions.   The patient was advised to call back or seek an in-person evaluation if the symptoms worsen or if the condition fails to improve as anticipated.  I provided 10 minutes of non-face-to-face  time during this encounter.   Iran Planas, PA-C

## 2019-02-17 NOTE — Telephone Encounter (Signed)
Sheryl Matthews called the after hour call line. She notes headache with augmentin and wants a z-pack.  Plan to add azithromycin to augmentin prescribed by PCP earlier today.  Recheck soon.

## 2019-02-18 ENCOUNTER — Telehealth: Payer: Self-pay | Admitting: Endocrinology

## 2019-02-18 NOTE — Telephone Encounter (Signed)
Called and spoke with patient. She wanted to verify that Novolog was sent to [facility] last week.

## 2019-02-18 NOTE — Telephone Encounter (Signed)
Patient states she request a phone call back from [facility] about an medication order she discussed with him yesterday.  Please Advise, Thanks

## 2019-02-19 ENCOUNTER — Telehealth: Payer: Self-pay

## 2019-02-19 NOTE — Telephone Encounter (Signed)
Awesome thanks!

## 2019-02-19 NOTE — Telephone Encounter (Signed)
Pt had called after hours due to some sinus pressure and congestion, PCP asked I call pt for update.   Per pt, SX getting better. Less sinus pressure and congestion improving. Pt states she is taking medications and does not have any needs nor concerns at this time.

## 2019-02-25 DIAGNOSIS — G4733 Obstructive sleep apnea (adult) (pediatric): Secondary | ICD-10-CM | POA: Diagnosis not present

## 2019-03-10 ENCOUNTER — Telehealth: Payer: Self-pay | Admitting: Endocrinology

## 2019-03-10 NOTE — Telephone Encounter (Signed)
Per [facility], "Pamala Hurry w/ dexcom, checking on fax sent over on pt. [phone]"

## 2019-03-12 DIAGNOSIS — H9212 Otorrhea, left ear: Secondary | ICD-10-CM | POA: Diagnosis not present

## 2019-03-17 ENCOUNTER — Telehealth: Payer: Self-pay | Admitting: Physician Assistant

## 2019-03-17 DIAGNOSIS — F319 Bipolar disorder, unspecified: Secondary | ICD-10-CM | POA: Diagnosis not present

## 2019-03-17 DIAGNOSIS — F3112 Bipolar disorder, current episode manic without psychotic features, moderate: Secondary | ICD-10-CM

## 2019-03-17 NOTE — Telephone Encounter (Signed)
Ok to place referral for patient for bipolar

## 2019-03-17 NOTE — Telephone Encounter (Signed)
Ok to place new referral 

## 2019-03-17 NOTE — Telephone Encounter (Signed)
She states she made a mistake. She would like to see Dr Viviana Simpler.  Dr Lincoln Brigham [phone]

## 2019-03-17 NOTE — Telephone Encounter (Signed)
Referral placed. Printed and given to [place], Teaching laboratory technician.

## 2019-03-17 NOTE — Telephone Encounter (Signed)
Pt called, request referral to - [facility], Phychiatric. Office phone: [phone]. Routing.

## 2019-03-18 NOTE — Telephone Encounter (Signed)
Sent referral - CF

## 2019-03-18 NOTE — Telephone Encounter (Signed)
Routing to [place] for new information.

## 2019-03-24 NOTE — Telephone Encounter (Signed)
Resent referral again - CF

## 2019-03-24 NOTE — Telephone Encounter (Signed)
The office of Dr Adele Schilder needs the referral sent again.

## 2019-04-01 DIAGNOSIS — G4733 Obstructive sleep apnea (adult) (pediatric): Secondary | ICD-10-CM | POA: Diagnosis not present

## 2019-04-08 ENCOUNTER — Encounter: Payer: Self-pay | Admitting: Physician Assistant

## 2019-04-08 DIAGNOSIS — E103293 Type 1 diabetes mellitus with mild nonproliferative diabetic retinopathy without macular edema, bilateral: Secondary | ICD-10-CM | POA: Diagnosis not present

## 2019-04-08 DIAGNOSIS — H43813 Vitreous degeneration, bilateral: Secondary | ICD-10-CM | POA: Diagnosis not present

## 2019-04-08 DIAGNOSIS — H0100B Unspecified blepharitis left eye, upper and lower eyelids: Secondary | ICD-10-CM | POA: Diagnosis not present

## 2019-04-08 DIAGNOSIS — H0100A Unspecified blepharitis right eye, upper and lower eyelids: Secondary | ICD-10-CM | POA: Diagnosis not present

## 2019-04-14 ENCOUNTER — Encounter: Payer: Self-pay | Admitting: Physician Assistant

## 2019-04-14 DIAGNOSIS — H43813 Vitreous degeneration, bilateral: Secondary | ICD-10-CM | POA: Insufficient documentation

## 2019-04-15 DIAGNOSIS — M79645 Pain in left finger(s): Secondary | ICD-10-CM | POA: Diagnosis not present

## 2019-04-28 ENCOUNTER — Other Ambulatory Visit: Payer: Self-pay

## 2019-04-28 ENCOUNTER — Other Ambulatory Visit: Payer: Self-pay | Admitting: Endocrinology

## 2019-04-28 ENCOUNTER — Other Ambulatory Visit (INDEPENDENT_AMBULATORY_CARE_PROVIDER_SITE_OTHER): Payer: Medicare Other

## 2019-04-28 DIAGNOSIS — M797 Fibromyalgia: Secondary | ICD-10-CM

## 2019-04-28 DIAGNOSIS — E1065 Type 1 diabetes mellitus with hyperglycemia: Secondary | ICD-10-CM | POA: Diagnosis not present

## 2019-04-28 DIAGNOSIS — M25561 Pain in right knee: Secondary | ICD-10-CM | POA: Diagnosis not present

## 2019-04-28 DIAGNOSIS — K5904 Chronic idiopathic constipation: Secondary | ICD-10-CM

## 2019-04-28 LAB — LIPID PANEL
Cholesterol: 217 mg/dL — ABNORMAL HIGH (ref 0–200)
HDL: 83.9 mg/dL (ref >39.00)
LDL Cholesterol: 119 mg/dL — ABNORMAL HIGH (ref 0–99)
NonHDL: 133.18
Total CHOL/HDL Ratio: 3
Triglycerides: 70 mg/dL (ref 0.0–149.0)
VLDL: 14 mg/dL (ref 0.0–40.0)

## 2019-04-28 LAB — GLUCOSE, RANDOM: Glucose, Bld: 206 mg/dL — ABNORMAL HIGH (ref 70–99)

## 2019-04-28 LAB — HEMOGLOBIN A1C: Hgb A1c MFr Bld: 7.9 % — ABNORMAL HIGH (ref 4.6–6.5)

## 2019-04-29 ENCOUNTER — Other Ambulatory Visit (INDEPENDENT_AMBULATORY_CARE_PROVIDER_SITE_OTHER): Payer: Medicare Other

## 2019-04-29 ENCOUNTER — Other Ambulatory Visit: Payer: Self-pay | Admitting: Endocrinology

## 2019-04-29 DIAGNOSIS — E1065 Type 1 diabetes mellitus with hyperglycemia: Secondary | ICD-10-CM

## 2019-04-29 DIAGNOSIS — K5904 Chronic idiopathic constipation: Secondary | ICD-10-CM

## 2019-04-29 DIAGNOSIS — M797 Fibromyalgia: Secondary | ICD-10-CM | POA: Diagnosis not present

## 2019-04-29 LAB — TSH: TSH: 1.41 u[IU]/mL (ref 0.35–4.50)

## 2019-04-30 ENCOUNTER — Ambulatory Visit: Payer: Medicare Other | Admitting: Physician Assistant

## 2019-05-01 ENCOUNTER — Other Ambulatory Visit: Payer: Self-pay

## 2019-05-01 ENCOUNTER — Encounter: Payer: Self-pay | Admitting: Endocrinology

## 2019-05-01 ENCOUNTER — Encounter: Payer: Medicare Other | Admitting: Endocrinology

## 2019-05-02 ENCOUNTER — Encounter: Payer: Self-pay | Admitting: Physician Assistant

## 2019-05-02 ENCOUNTER — Ambulatory Visit (INDEPENDENT_AMBULATORY_CARE_PROVIDER_SITE_OTHER): Payer: Medicare Other | Admitting: Physician Assistant

## 2019-05-02 VITALS — BP 119/61 | HR 88 | Temp 98.8°F | Ht 62.0 in | Wt 139.0 lb

## 2019-05-02 DIAGNOSIS — B351 Tinea unguium: Secondary | ICD-10-CM

## 2019-05-02 DIAGNOSIS — B352 Tinea manuum: Secondary | ICD-10-CM

## 2019-05-02 DIAGNOSIS — Z23 Encounter for immunization: Secondary | ICD-10-CM

## 2019-05-02 DIAGNOSIS — H9012 Conductive hearing loss, unilateral, left ear, with unrestricted hearing on the contralateral side: Secondary | ICD-10-CM | POA: Diagnosis not present

## 2019-05-02 DIAGNOSIS — Z01818 Encounter for other preprocedural examination: Secondary | ICD-10-CM | POA: Diagnosis not present

## 2019-05-02 DIAGNOSIS — E1065 Type 1 diabetes mellitus with hyperglycemia: Secondary | ICD-10-CM | POA: Diagnosis not present

## 2019-05-02 DIAGNOSIS — H9212 Otorrhea, left ear: Secondary | ICD-10-CM | POA: Diagnosis not present

## 2019-05-02 LAB — POCT UA - MICROALBUMIN
Albumin/Creatinine Ratio, Urine, POC: <30
Creatinine, POC: 300 mg/dL
Microalbumin Ur, POC: 80 mg/L

## 2019-05-02 MED ORDER — KETOCONAZOLE 2 % EX CREA: 1.0000 "application " | TOPICAL_CREAM | Freq: Two times a day (BID) | CUTANEOUS | 1 refills | Status: DC

## 2019-05-02 MED ORDER — CICLOPIROX 8 % EX SOLN: Freq: Every day | CUTANEOUS | 0 refills | Status: DC

## 2019-05-02 NOTE — Progress Notes (Signed)
This encounter was created in error - please disregard.

## 2019-05-02 NOTE — Progress Notes (Signed)
Subjective:    Patient ID: Sheryl Matthews, female    DOB: Aug 22, 1966, 53 y.o.   MRN: [id number]  HPI Pt is a 53 yo female with uncontrolled type I diabetes, OSA who presents to the clinic to be released for trigger finger repair on left hand. Pt has no problems or concerns.   Endocrinology manages her sugars. Last a1c was 7.9.   Marland Kitchen. Active Ambulatory Problems    Diagnosis Date Noted  . Bipolar 1 disorder with moderate mania ([place]) 11/02/2014  . Mild nonproliferative diabetic retinopathy ([place]) 12/22/2014  . Pseudophakia of both eyes 12/22/2014  . Abdominal pain 06/07/2015  . Internal hemorrhoids 08/16/2015  . Uncontrolled type 1 diabetes mellitus with hyperglycemia ([place]) 09/15/2015  . HAV (hallux abducto valgus) 12/20/2015  . Adductovarus rotation of toe, acquired 12/20/2015  . Painful breasts 06/14/2016  . History of fibrocystic disease of breast 06/14/2016  . Left breast mass 07/12/2016  . Fibromyalgia 12/26/2016  . Vitamin D insufficiency 01/30/2017  . Low iron stores 01/30/2017  . SOB (shortness of breath) 02/02/2017  . No energy 02/02/2017  . Chest tightness 02/02/2017  . Weakness 02/02/2017  . RLS (restless legs syndrome) 12/18/2017  . Chronic idiopathic constipation 12/24/2018  . Posterior vitreous degeneration, bilateral 04/14/2019  . Tinea manuum 05/05/2019  . Toenail fungus 05/05/2019   Resolved Ambulatory Problems    Diagnosis Date Noted  . Type I diabetes mellitus with peripheral autonomic neuropathy ([place]) 11/02/2014   Past Medical History:  Diagnosis Date  . Bipolar 1 disorder ([place])   . Deafness in left ear   . Depression   . Diabetes mellitus without complication ([place])   . Osteoarthritis   . Pain of left breast   . Skin disorder       Review of Systems See HPI.     Objective:   Physical Exam Vitals signs reviewed.  Constitutional:      Appearance: Normal appearance.  HENT:     Head: Normocephalic and atraumatic.  Cardiovascular:     Rate and  Rhythm: Normal rate and regular rhythm.  Pulmonary:     Effort: Pulmonary effort is normal.     Breath sounds: Normal breath sounds.  Skin:    Comments: Diffuse erythematous over bilateral hands with fine scales.   Multiple thick yellow toenails.   Neurological:     General: No focal deficit present.     Mental Status: She is alert and oriented to person, place, and time.  Psychiatric:        Mood and Affect: Mood normal.           Assessment & Plan:  Marland KitchenMarland KitchenCaylei was seen today for medical clearance.  Diagnoses and all orders for this visit:  Preop examination -     CBC with Differential/Platelet -     COMPLETE METABOLIC PANEL WITH GFR  Uncontrolled type 1 diabetes mellitus with hyperglycemia (HCC) -     Pneumococcal polysaccharide vaccine 23-valent greater than or equal to 2yo subcutaneous/IM -     POCT UA - Microalbumin  Need for pneumococcal vaccination -     Pneumococcal polysaccharide vaccine 23-valent greater than or equal to 2yo subcutaneous/IM  Toenail fungus -     ciclopirox (PENLAC) 8 % solution; Apply topically at bedtime. Apply over nail and surrounding skin. Apply daily over previous coat. After seven (7) days, may remove with alcohol and continue cycle.  Tinea manuum -     ketoconazole (NIZORAL) 2 % cream; Apply 1 application topically 2 (  two) times daily. To affected areas.   CBC and CMP.  Filled out form and will send labs once pt has drawn.   penlac given for toenail fungus.   nizoral given for fungus on hands. Follow up as needed.

## 2019-05-05 ENCOUNTER — Encounter: Payer: Self-pay | Admitting: Physician Assistant

## 2019-05-05 DIAGNOSIS — B351 Tinea unguium: Secondary | ICD-10-CM | POA: Insufficient documentation

## 2019-05-05 DIAGNOSIS — Z01818 Encounter for other preprocedural examination: Secondary | ICD-10-CM | POA: Diagnosis not present

## 2019-05-05 DIAGNOSIS — B352 Tinea manuum: Secondary | ICD-10-CM | POA: Insufficient documentation

## 2019-05-05 LAB — COMPLETE METABOLIC PANEL WITH GFR
AG Ratio: 1.6 (calc) (ref 1.0–2.5)
ALT: 23 U/L (ref 6–29)
AST: 21 U/L (ref 10–35)
Albumin: 4.1 g/dL (ref 3.6–5.1)
Alkaline phosphatase (APISO): 91 U/L (ref 37–153)
BUN: 20 mg/dL (ref 7–25)
CO2: 31 mmol/L (ref 20–32)
Calcium: 9.3 mg/dL (ref 8.6–10.4)
Chloride: 101 mmol/L (ref 98–110)
Creat: 0.73 mg/dL (ref 0.50–1.05)
GFR, Est African American: 108 mL/min/{1.73_m2} (ref >=60)
GFR, Est Non African American: 93 mL/min/{1.73_m2} (ref >=60)
Globulin: 2.6 g/dL (calc) (ref 1.9–3.7)
Glucose, Bld: 151 mg/dL — ABNORMAL HIGH (ref 65–99)
Potassium: 4.8 mmol/L (ref 3.5–5.3)
Sodium: 138 mmol/L (ref 135–146)
Total Bilirubin: 0.6 mg/dL (ref 0.2–1.2)
Total Protein: 6.7 g/dL (ref 6.1–8.1)

## 2019-05-05 LAB — CBC WITH DIFFERENTIAL/PLATELET
Absolute Monocytes: 367 cells/uL (ref 200–950)
Basophils Absolute: 51 cells/uL (ref 0–200)
Basophils Relative: 1.3 %
Eosinophils Absolute: 218 cells/uL (ref 15–500)
Eosinophils Relative: 5.6 %
HCT: 39.6 % (ref 35.0–45.0)
Hemoglobin: 13 g/dL (ref 11.7–15.5)
Lymphs Abs: 1295 cells/uL (ref 850–3900)
MCH: 29.2 pg (ref 27.0–33.0)
MCHC: 32.8 g/dL (ref 32.0–36.0)
MCV: 89 fL (ref 80.0–100.0)
MPV: 11.6 fL (ref 7.5–12.5)
Monocytes Relative: 9.4 %
Neutro Abs: 1970 cells/uL (ref 1500–7800)
Neutrophils Relative %: 50.5 %
Platelets: 291 10*3/uL (ref 140–400)
RBC: 4.45 10*6/uL (ref 3.80–5.10)
RDW: 13 % (ref 11.0–15.0)
Total Lymphocyte: 33.2 %
WBC: 3.9 10*3/uL (ref 3.8–10.8)

## 2019-05-06 DIAGNOSIS — M6530 Trigger finger, unspecified finger: Secondary | ICD-10-CM | POA: Diagnosis not present

## 2019-05-06 DIAGNOSIS — Z1159 Encounter for screening for other viral diseases: Secondary | ICD-10-CM | POA: Diagnosis not present

## 2019-05-06 DIAGNOSIS — F319 Bipolar disorder, unspecified: Secondary | ICD-10-CM | POA: Diagnosis not present

## 2019-05-06 DIAGNOSIS — Z01812 Encounter for preprocedural laboratory examination: Secondary | ICD-10-CM | POA: Diagnosis not present

## 2019-05-06 DIAGNOSIS — E1042 Type 1 diabetes mellitus with diabetic polyneuropathy: Secondary | ICD-10-CM | POA: Diagnosis not present

## 2019-05-06 NOTE — Progress Notes (Signed)
Ok to send along with letter for surgical clearance.

## 2019-05-08 ENCOUNTER — Encounter: Payer: Self-pay | Admitting: Physician Assistant

## 2019-05-08 NOTE — Progress Notes (Signed)
Done document scanned in. KG LPN

## 2019-05-09 NOTE — Telephone Encounter (Signed)
Spoke with patient and she states she goes to [facility]. Requesting records.

## 2019-05-09 NOTE — Telephone Encounter (Signed)
Please find out where she gets her Pap smear done

## 2019-05-12 ENCOUNTER — Other Ambulatory Visit: Payer: Self-pay

## 2019-05-12 DIAGNOSIS — F319 Bipolar disorder, unspecified: Secondary | ICD-10-CM | POA: Diagnosis not present

## 2019-05-13 ENCOUNTER — Ambulatory Visit: Payer: Medicare Other | Admitting: Endocrinology

## 2019-05-14 DIAGNOSIS — H9212 Otorrhea, left ear: Secondary | ICD-10-CM | POA: Diagnosis not present

## 2019-05-19 ENCOUNTER — Ambulatory Visit: Payer: Medicare Other | Admitting: Endocrinology

## 2019-05-27 ENCOUNTER — Telehealth: Payer: Self-pay | Admitting: Endocrinology

## 2019-05-27 NOTE — Telephone Encounter (Signed)
Johnny with [facility] called requesting an order form be faxed back for Dexcom G6 - states that the G5 will be obsolete very soon and she will need to be upgraded. Form faxed to [phone].   Will need this faxed back asap.  Call back # [phone]  Ext [phone] Fax # [phone]

## 2019-05-27 NOTE — Telephone Encounter (Signed)
Patient requests a new Rx for 300 Contour Next test strips (patient has been running out and needs 300 test strips per each RX sent in the future) with refills. It's for the Dexcom G5 sent to Medicare.

## 2019-05-28 ENCOUNTER — Other Ambulatory Visit: Payer: Self-pay

## 2019-05-28 ENCOUNTER — Telehealth: Payer: Self-pay | Admitting: Nutrition

## 2019-05-28 MED ORDER — FREESTYLE LITE TEST VI STRP: ORAL_STRIP | 1 refills | Status: DC

## 2019-05-28 NOTE — Telephone Encounter (Signed)
This has been completed and will be faxed today.

## 2019-05-28 NOTE — Telephone Encounter (Signed)
Discussed the advantages of having her blood sugar readings being linked to our system here.  I sent her a link for this, and discussed how to do this.  She did not want a virtual visit Monday, but said she would review this and let me know what she wanted to do on Monday

## 2019-05-29 DIAGNOSIS — H9312 Tinnitus, left ear: Secondary | ICD-10-CM | POA: Diagnosis not present

## 2019-05-29 DIAGNOSIS — H9202 Otalgia, left ear: Secondary | ICD-10-CM | POA: Diagnosis not present

## 2019-05-29 DIAGNOSIS — H9392 Unspecified disorder of left ear: Secondary | ICD-10-CM | POA: Diagnosis not present

## 2019-05-29 DIAGNOSIS — H9012 Conductive hearing loss, unilateral, left ear, with unrestricted hearing on the contralateral side: Secondary | ICD-10-CM | POA: Diagnosis not present

## 2019-05-29 DIAGNOSIS — B369 Superficial mycosis, unspecified: Secondary | ICD-10-CM | POA: Diagnosis not present

## 2019-05-29 DIAGNOSIS — H838X3 Other specified diseases of inner ear, bilateral: Secondary | ICD-10-CM | POA: Diagnosis not present

## 2019-05-29 DIAGNOSIS — H90A21 Sensorineural hearing loss, unilateral, right ear, with restricted hearing on the contralateral side: Secondary | ICD-10-CM | POA: Diagnosis not present

## 2019-06-02 ENCOUNTER — Ambulatory Visit (INDEPENDENT_AMBULATORY_CARE_PROVIDER_SITE_OTHER): Payer: Medicare Other | Admitting: Endocrinology

## 2019-06-02 ENCOUNTER — Other Ambulatory Visit: Payer: Self-pay

## 2019-06-02 ENCOUNTER — Encounter: Payer: Self-pay | Admitting: Endocrinology

## 2019-06-02 VITALS — BP 112/70 | HR 94 | Ht 62.0 in | Wt 140.6 lb

## 2019-06-02 DIAGNOSIS — E1065 Type 1 diabetes mellitus with hyperglycemia: Secondary | ICD-10-CM

## 2019-06-02 DIAGNOSIS — E7800 Pure hypercholesterolemia, unspecified: Secondary | ICD-10-CM

## 2019-06-02 NOTE — Patient Instructions (Addendum)
Lantus 6 in am and 12 in pm  Change hi alert to 300 on Dexcom

## 2019-06-02 NOTE — Progress Notes (Signed)
Patient ID: Sheryl Matthews, female   DOB: [birth year], 53 y.o.   MRN: [id number]          Reason for Appointment : Follow-up for Type 1 Diabetes  History of Present Illness          Diagnosis: Type 1 diabetes mellitus, date of diagnosis:  at age 34    Previous history:   She has been on insulin since the time of diagnosis. She was on the same program of NPH and Regular Insulin since diagnosis was made when she was first seen here  Recent history:   INSULIN regimen is described as:  Lantus 8 units a.m.--10 units in p.m., Novolog 5-8 units,  1: 10 carbohydrate ratio, using. 1:50 correction.  Her A1c again about the same at 7.9    Glucose patterns, management and problems identified:    She has about the same blood sugar patterns as discussed below in the interpretation of her CGM  HIGHEST blood sugars are overnight as before partly from rebound but also frequently from late evening snacks such as popcorn without adequate coverage  She says that she has difficulty with binging especially on snacks like popcorn in the evening and does not know how to cover this with insulin  She is also forgetting her mealtime insulin dose occasionally and may at times have done a second dose at the same time  Low blood sugars in the evenings are related to overestimating her intake as well as activity  Even with increasing her Lantus in the evening her overnight blood sugars are persistently high  She still likes to do fingersticks comparison for her Dexcom sensor and she thinks that occasionally readings are showing discrepancy  Currently her high blood sugar alert is 400 on her Dexcom     CONTINUOUS GLUCOSE MONITORING RECORD INTERPRETATION    Dates of Recording: 6/23 through 7/6  Sensor description: Dexcom  Results statistics:   CGM use % of time  96  Average and SD 177  Time in range      50 %, was 44  % Time Above 180  43  % Time above 250  19  % Time Below target  7.1     Glycemic patterns summary: Blood sugars are showing significant variability with standard deviation 83 and most of the variability is overnight As before most of her high readings are overnight and then sporadically during the day Blood sugars are the lowest around 6-8 PM and this may be related to over calculation of her insulin as well as increased physical activity  Hyperglycemic episodes are primarily occurring overnight starting around midnight and generally continuing until at least 8 AM Also sporadically will have high blood sugars mid afternoon and early evening around 5 PM  Hypoglycemic episodes occurred on 3 occasions with blood sugars dropping to as low as 39 between 6-8 PM and rarely before midnight  Overnight periods: Blood sugars usually going up significantly high starting around 10 PM and very consistently higher through the night Highest blood sugar is averaging about 230 at 3 AM and average blood sugar is over 200 between 1 AM-9 AM  Preprandial periods: Blood sugars are mostly high before breakfast at 8 AM with average as below  Postprandial periods:   After breakfast:   Blood sugars are high before eating and then generally decline after morning insulin down to about 145 average after 10 AM  After lunch:   Sugars are well controlled with blood sugars not rising  significantly  After dinner: Blood sugars are generally only rising slightly after dinner and occasionally may be lower   PRE-MEAL Fasting Lunch Dinner Bedtime Overall  Glucose range:    39-353    Mean/median:  235  113  125     POST-MEAL PC Breakfast PC Lunch PC Dinner  Glucose range:    112  Mean/median:         Factors causing hyperglycemia: Overestimating mealtime bolus , occasionally over correcting high sugars in the mornings and occasionally over correcting postprandial hyperglycemia; sometimes with exercise  Symptoms of hypoglycemia: Weakness, dizziness, difficulty thinking, feels sweaty and  shaky only if blood sugar in the 30s.  May recognize symptoms low 60 at times but inconsistently.  Has had seizures during the night from hypoglycemia Treatment of hypoglycemia: juice, peanut butter, Glucagon     CGM use % of time  98  2-week average/SD  190+/-71  Time in range    44    %  % Time Above 180  53  % Time above 250  21  % Time Below 70  4      Diabetes labs:  Lab Results  Component Value Date   HGBA1C 7.9 (H) 04/28/2019   HGBA1C 8.0 (H) 01/31/2019   HGBA1C 7.8 (A) 10/01/2018   Lab Results  Component Value Date   MICROALBUR 80 05/02/2019   LDLCALC 119 (H) 04/28/2019   CREATININE 0.73 05/05/2019    Lab Results  Component Value Date   FRUCTOSAMINE 347 (H) 07/12/2018   FRUCTOSAMINE 340 (H) 06/11/2017     Allergies as of 06/02/2019      Reactions   Latex Rash   Tape Rash   Elavil [amitriptyline Hcl]    Groggy/constipation/did not think worked    Lyrica [pregabalin]    Did not have benefit.  Falling asleep. Feeling depressed.      Medication List       Accurate as of June 02, 2019  3:16 PM. If you have any questions, ask your nurse or doctor.        ALPRAZolam 0.5 MG tablet Commonly known as: XANAX 1 tablet   buPROPion 300 MG 24 hr tablet Commonly known as: WELLBUTRIN XL Take 300 mg by mouth daily.   ciclopirox 8 % solution Commonly known as: Penlac Apply topically at bedtime. Apply over nail and surrounding skin. Apply daily over previous coat. After seven (7) days, may remove with alcohol and continue cycle.   FLUoxetine 20 MG capsule Commonly known as: PROZAC Take 4 capsules (80 mg total) by mouth daily.   fluticasone 50 MCG/ACT nasal spray Commonly known as: FLONASE USE 1 SPRAY IN EACH NOSTRIL DAILY   FREESTYLE LITE test strip Generic drug: glucose blood USE AS INSTRUCTED TO CHECK BLOOD SUGAR SIX TIMES PER DAY   gabapentin 300 MG capsule Commonly known as: NEURONTIN TAKE 1 CAPSULE DAILY.   glucagon 1 MG injection Commonly  known as: Glucagon Emergency INJECT 1 MG INTO THE MUSCLE FOR SEVERE HYPOGLYCEMIA   ibuprofen 200 MG tablet Commonly known as: ADVIL Take 200 mg by mouth as needed.   insulin aspart 100 UNIT/ML FlexPen Commonly known as: NovoLOG FlexPen Inject 7-8 Units into the skin 3 (three) times daily with meals. Inject 7-8 units under the skin three times daily before meals.   Insulin Glargine 100 UNIT/ML Solostar Pen Commonly known as: Lantus SoloStar INJECT 7 UNITS UNDER THE SKIN IN THE MORNING, AND 8 UNITS IN THE EVENING. What changed: additional instructions  Insulin Pen Needle 32G X 4 MM Misc Commonly known as: BD Pen Needle Nano U/F Use 7 per day to inject insulin   ketoconazole 2 % cream Commonly known as: NIZORAL Apply 1 application topically 2 (two) times daily. To affected areas.   Ketostix strip Generic drug: acetone (urine) test USE TO CHECK URINE KETONES IF BLOOD SUGAR IS OVER 300 AS DIRECTED BY PHYSICIAN   ranitidine 300 MG tablet Commonly known as: ZANTAC Take 300 mg by mouth daily.       Allergies:  Allergies  Allergen Reactions  . Latex Rash  . Tape Rash  . Elavil [Amitriptyline Hcl]     Groggy/constipation/did not think worked   . Lyrica [Pregabalin]     Did not have benefit.  Falling asleep. Feeling depressed.    Past Medical History:  Diagnosis Date  . Bipolar 1 disorder ([place])   . Deafness in left ear   . Depression   . Diabetes mellitus without complication ([place])   . Osteoarthritis   . Pain of left breast   . Skin disorder     Past Surgical History:  Procedure Laterality Date  . ANKLE FRACTURE SURGERY Left   . BREAST ENHANCEMENT SURGERY    . BREAST EXCISIONAL BIOPSY Left   . CERVICAL DISC SURGERY    . CESAREAN SECTION    . cosmetic surgery facial    . ELBOW SURGERY Right   . GALLBLADDER SURGERY    . INNER EAR SURGERY    . MINOR CARPAL TUNNEL Left     Family History  Problem Relation Age of Onset  . Depression Sister   . Alcohol  abuse Brother   . Diabetes Brother   . Depression Brother   . Breast cancer Maternal Aunt     Social History:  reports that she has never smoked. She has never used smokeless tobacco. She reports current alcohol use. She reports that she does not use drugs.     ROS       Lipids: LDL, Not on any statins  Lab Results  Component Value Date   CHOL 217 (H) 04/28/2019   HDL 83.90 04/28/2019   LDLCALC 119 (H) 04/28/2019   TRIG 70.0 04/28/2019   CHOLHDL 3 04/28/2019    Depression: She is taking Wellbutrin and Prozac but appears to be still having difficulties with anxiety and depression and not sleeping well      She has a history of Numbness and, tingling in her feet   She thinks she has a lot of joint pains but has been told to have fibromyalgia, not on any specific treatment   Physical Examination:  BP 112/70 (BP Location: Left Arm, Patient Position: Sitting, Cuff Size: Normal)   Pulse 94   Ht 5\' 2"  (1.575 m)   Wt 140 lb 9.6 oz (63.8 kg)   LMP 01/09/2018   SpO2 97%   BMI 25.72 kg/m     ASSESSMENT:  Diabetes type 1, long-standing and persistently poorly controlled  See history of present illness for detailed discussion of current diabetes management, blood sugar patterns and problems identified  Her A1c is 8%  She is still not having any better blood sugar patterns as discussed above Although she thinks some of her high sugars are from stress and binge eating most of her high sugars are again through the night Also cannot always explain why she sometimes has low sugars between 6-8 PM and may be related partly to activity level or excessive insulin for  coverage She also has the same issues with forgetfulness  She does not think she is capable of learning how to use the insulin pump  HYPERLIPIDEMIA: LDL is higher than before and likely to be from her poor diet with eating more sweets such as cupcakes, buttered popcorn and other high fat snacks  PLAN:    She  will try to manage her mealtime insulin as discussed in detail today If she is eating excessive snacks she can take additional boluses right after finishing her excess snacks regardless of whether she has taken mealtime insulin earlier in the evening She does need to improve her diet  She needs to increase her LANTUS in the evening to 12 units Also to decrease tendency to low sugars around dinnertime she will reduce her morning Lantus down to 6 units She was shown how the [place] would help her with compliance with her mealtime insulin, reduce her tendency to forgetting insulin or taking double doses as well as if she is able to link it with her smart phone to help her with calculations at mealtimes She will call to see if this is covered  She will need to have a snack while being active so that she does not have low sugars She needs to watch blood sugars trending down and take action before it gets low Also she can change her high sugar alert to 300 so she can take all correction doses in advance If she thinks her blood sugars are going up from stress she can take correction doses  Recommend that she talk to her PCP for various allergy issues and also her psychiatrist for management of her depression and binge eating    Patient Instructions  Lantus 6 in am and 12 in pm  Change hi alert to 300   Counseling time on subjects discussed in assessment and plan sections is over 50% of today's 25 minute visit     Elayne Snare 06/02/2019, 3:16 PM   Note: This note was prepared with Dragon voice recognition system technology. Any transcriptional errors that result from this process are unintentional.    Elayne Snare

## 2019-06-03 ENCOUNTER — Other Ambulatory Visit: Payer: Self-pay

## 2019-06-03 MED ORDER — FREESTYLE LITE TEST VI STRP: ORAL_STRIP | 1 refills | Status: DC

## 2019-06-05 ENCOUNTER — Encounter: Payer: Self-pay | Admitting: Physician Assistant

## 2019-06-05 DIAGNOSIS — H43813 Vitreous degeneration, bilateral: Secondary | ICD-10-CM | POA: Diagnosis not present

## 2019-06-05 DIAGNOSIS — H527 Unspecified disorder of refraction: Secondary | ICD-10-CM | POA: Diagnosis not present

## 2019-06-05 LAB — HM DIABETES EYE EXAM

## 2019-06-09 DIAGNOSIS — Z01812 Encounter for preprocedural laboratory examination: Secondary | ICD-10-CM | POA: Diagnosis not present

## 2019-06-09 DIAGNOSIS — Z1159 Encounter for screening for other viral diseases: Secondary | ICD-10-CM | POA: Diagnosis not present

## 2019-06-11 DIAGNOSIS — H9012 Conductive hearing loss, unilateral, left ear, with unrestricted hearing on the contralateral side: Secondary | ICD-10-CM | POA: Diagnosis not present

## 2019-06-11 DIAGNOSIS — H748X3 Other specified disorders of middle ear and mastoid, bilateral: Secondary | ICD-10-CM | POA: Diagnosis not present

## 2019-06-11 DIAGNOSIS — H9202 Otalgia, left ear: Secondary | ICD-10-CM | POA: Diagnosis not present

## 2019-06-11 DIAGNOSIS — H7190 Unspecified cholesteatoma, unspecified ear: Secondary | ICD-10-CM | POA: Diagnosis not present

## 2019-06-12 DIAGNOSIS — Z79899 Other long term (current) drug therapy: Secondary | ICD-10-CM | POA: Diagnosis not present

## 2019-06-12 DIAGNOSIS — Z809 Family history of malignant neoplasm, unspecified: Secondary | ICD-10-CM | POA: Diagnosis not present

## 2019-06-12 DIAGNOSIS — K219 Gastro-esophageal reflux disease without esophagitis: Secondary | ICD-10-CM | POA: Diagnosis not present

## 2019-06-12 DIAGNOSIS — Z91040 Latex allergy status: Secondary | ICD-10-CM | POA: Diagnosis not present

## 2019-06-12 DIAGNOSIS — M65332 Trigger finger, left middle finger: Secondary | ICD-10-CM | POA: Diagnosis not present

## 2019-06-12 DIAGNOSIS — M65342 Trigger finger, left ring finger: Secondary | ICD-10-CM | POA: Diagnosis not present

## 2019-06-12 DIAGNOSIS — G4733 Obstructive sleep apnea (adult) (pediatric): Secondary | ICD-10-CM | POA: Diagnosis not present

## 2019-06-12 DIAGNOSIS — Z794 Long term (current) use of insulin: Secondary | ICD-10-CM | POA: Diagnosis not present

## 2019-06-12 DIAGNOSIS — E118 Type 2 diabetes mellitus with unspecified complications: Secondary | ICD-10-CM | POA: Diagnosis not present

## 2019-06-12 DIAGNOSIS — Z833 Family history of diabetes mellitus: Secondary | ICD-10-CM | POA: Diagnosis not present

## 2019-06-19 ENCOUNTER — Encounter: Payer: Self-pay | Admitting: Physician Assistant

## 2019-06-25 DIAGNOSIS — M791 Myalgia, unspecified site: Secondary | ICD-10-CM | POA: Diagnosis not present

## 2019-06-25 DIAGNOSIS — M2420 Disorder of ligament, unspecified site: Secondary | ICD-10-CM | POA: Diagnosis not present

## 2019-06-25 DIAGNOSIS — M5030 Other cervical disc degeneration, unspecified cervical region: Secondary | ICD-10-CM | POA: Diagnosis not present

## 2019-06-25 DIAGNOSIS — M542 Cervicalgia: Secondary | ICD-10-CM | POA: Diagnosis not present

## 2019-06-25 DIAGNOSIS — Z79891 Long term (current) use of opiate analgesic: Secondary | ICD-10-CM | POA: Diagnosis not present

## 2019-07-07 DIAGNOSIS — M791 Myalgia, unspecified site: Secondary | ICD-10-CM | POA: Diagnosis not present

## 2019-07-07 DIAGNOSIS — M5030 Other cervical disc degeneration, unspecified cervical region: Secondary | ICD-10-CM | POA: Diagnosis not present

## 2019-07-07 DIAGNOSIS — M542 Cervicalgia: Secondary | ICD-10-CM | POA: Diagnosis not present

## 2019-07-07 DIAGNOSIS — Z981 Arthrodesis status: Secondary | ICD-10-CM | POA: Diagnosis not present

## 2019-07-10 DIAGNOSIS — M791 Myalgia, unspecified site: Secondary | ICD-10-CM | POA: Diagnosis not present

## 2019-07-10 DIAGNOSIS — M5030 Other cervical disc degeneration, unspecified cervical region: Secondary | ICD-10-CM | POA: Diagnosis not present

## 2019-07-10 DIAGNOSIS — M542 Cervicalgia: Secondary | ICD-10-CM | POA: Diagnosis not present

## 2019-07-10 DIAGNOSIS — M7918 Myalgia, other site: Secondary | ICD-10-CM | POA: Diagnosis not present

## 2019-07-16 DIAGNOSIS — M069 Rheumatoid arthritis, unspecified: Secondary | ICD-10-CM | POA: Diagnosis not present

## 2019-07-16 DIAGNOSIS — E161 Other hypoglycemia: Secondary | ICD-10-CM | POA: Diagnosis not present

## 2019-07-16 DIAGNOSIS — Z91048 Other nonmedicinal substance allergy status: Secondary | ICD-10-CM | POA: Diagnosis not present

## 2019-07-16 DIAGNOSIS — M797 Fibromyalgia: Secondary | ICD-10-CM | POA: Diagnosis not present

## 2019-07-16 DIAGNOSIS — E1040 Type 1 diabetes mellitus with diabetic neuropathy, unspecified: Secondary | ICD-10-CM | POA: Diagnosis not present

## 2019-07-16 DIAGNOSIS — K429 Umbilical hernia without obstruction or gangrene: Secondary | ICD-10-CM | POA: Diagnosis not present

## 2019-07-16 DIAGNOSIS — E11649 Type 2 diabetes mellitus with hypoglycemia without coma: Secondary | ICD-10-CM | POA: Diagnosis not present

## 2019-07-16 DIAGNOSIS — Z91040 Latex allergy status: Secondary | ICD-10-CM | POA: Diagnosis not present

## 2019-07-16 DIAGNOSIS — G4733 Obstructive sleep apnea (adult) (pediatric): Secondary | ICD-10-CM | POA: Diagnosis not present

## 2019-07-16 DIAGNOSIS — E162 Hypoglycemia, unspecified: Secondary | ICD-10-CM | POA: Diagnosis not present

## 2019-07-16 DIAGNOSIS — R0902 Hypoxemia: Secondary | ICD-10-CM | POA: Diagnosis not present

## 2019-07-16 DIAGNOSIS — Z794 Long term (current) use of insulin: Secondary | ICD-10-CM | POA: Diagnosis not present

## 2019-07-16 DIAGNOSIS — M1990 Unspecified osteoarthritis, unspecified site: Secondary | ICD-10-CM | POA: Diagnosis not present

## 2019-07-16 DIAGNOSIS — H43813 Vitreous degeneration, bilateral: Secondary | ICD-10-CM | POA: Diagnosis not present

## 2019-07-16 DIAGNOSIS — F319 Bipolar disorder, unspecified: Secondary | ICD-10-CM | POA: Diagnosis not present

## 2019-07-16 DIAGNOSIS — K5900 Constipation, unspecified: Secondary | ICD-10-CM | POA: Diagnosis not present

## 2019-07-16 DIAGNOSIS — Z9989 Dependence on other enabling machines and devices: Secondary | ICD-10-CM | POA: Diagnosis not present

## 2019-07-16 DIAGNOSIS — R109 Unspecified abdominal pain: Secondary | ICD-10-CM | POA: Diagnosis not present

## 2019-07-16 DIAGNOSIS — Z9049 Acquired absence of other specified parts of digestive tract: Secondary | ICD-10-CM | POA: Diagnosis not present

## 2019-07-16 DIAGNOSIS — Z888 Allergy status to other drugs, medicaments and biological substances status: Secondary | ICD-10-CM | POA: Diagnosis not present

## 2019-07-16 DIAGNOSIS — I10 Essential (primary) hypertension: Secondary | ICD-10-CM | POA: Diagnosis not present

## 2019-07-16 DIAGNOSIS — E10649 Type 1 diabetes mellitus with hypoglycemia without coma: Secondary | ICD-10-CM | POA: Diagnosis not present

## 2019-07-16 DIAGNOSIS — H1033 Unspecified acute conjunctivitis, bilateral: Secondary | ICD-10-CM | POA: Diagnosis not present

## 2019-07-16 DIAGNOSIS — Z79899 Other long term (current) drug therapy: Secondary | ICD-10-CM | POA: Diagnosis not present

## 2019-07-16 DIAGNOSIS — R51 Headache: Secondary | ICD-10-CM | POA: Diagnosis not present

## 2019-07-16 DIAGNOSIS — K219 Gastro-esophageal reflux disease without esophagitis: Secondary | ICD-10-CM | POA: Diagnosis not present

## 2019-07-16 DIAGNOSIS — R1084 Generalized abdominal pain: Secondary | ICD-10-CM | POA: Diagnosis not present

## 2019-07-18 ENCOUNTER — Telehealth (INDEPENDENT_AMBULATORY_CARE_PROVIDER_SITE_OTHER): Payer: Medicare Other | Admitting: Physician Assistant

## 2019-07-18 VITALS — Ht 62.0 in | Wt 138.0 lb

## 2019-07-18 DIAGNOSIS — G4452 New daily persistent headache (NDPH): Secondary | ICD-10-CM

## 2019-07-18 DIAGNOSIS — H7191 Unspecified cholesteatoma, right ear: Secondary | ICD-10-CM

## 2019-07-18 MED ORDER — DEXAMETHASONE 4 MG PO TABS: ORAL_TABLET | ORAL | 0 refills | Status: DC

## 2019-07-18 MED ORDER — AMOXICILLIN-POT CLAVULANATE 875-125 MG PO TABS: 1.0000 | ORAL_TABLET | Freq: Two times a day (BID) | ORAL | 0 refills | Status: DC

## 2019-07-18 NOTE — Progress Notes (Signed)
HA x 1 week Nausea with no vomiting Blurred vision Mornings worse  Sensitivity to light/sound Nothing has helped - tried ibuprofen ER last night due to blood sugar drop, blood sugar has been up and down since steroid injection a week ago (received for inflammation) Wants to discuss testing for autoimmune disease [id number] completed.

## 2019-07-18 NOTE — Progress Notes (Signed)
Patient ID: Sheryl Matthews, female   DOB: 1966-11-01, 53 y.o.   MRN: [id number] .Marland KitchenVirtual Visit via Video Note  I connected with Sheryl Matthews on 07/21/19 at 10:30 AM EDT by a video enabled telemedicine application and verified that I am speaking with the correct person using two identifiers.  Location: Patient: home Provider: clinic   I discussed the limitations of evaluation and management by telemedicine and the availability of in person appointments. The patient expressed understanding and agreed to proceed.  History of Present Illness: Pt is a 53 yo female with T1DM, Fibromyalgia who calls into the clinic to discuss recent daily headache for the last week. She feels like headache just will not go away. She describes it as a dull ache. She has some nausea but no vomiting. Her vision feels blurred but no more than it normally does. Headache feels better when she lays down. Present frontally right over both eyes. She is a little more sensitive to light and sound. Ibuprofen is not really helping. Last night went to ED due to blood sugar dropping. Did not mention headache. She did have steroid injection last week and caused sugars to flucuate some. Denies any speech changes or lower/upper extremity weakness. She denies a lot of caffiene usage. No diet changes. Pt does have a cholesteatoma and seeing ENT on 28th of this month. She does feel "congested".   .. Active Ambulatory Problems    Diagnosis Date Noted  . Bipolar 1 disorder with moderate mania ([place]) 11/02/2014  . Mild nonproliferative diabetic retinopathy ([place]) 12/22/2014  . Pseudophakia of both eyes 12/22/2014  . Abdominal pain 06/07/2015  . Internal hemorrhoids 08/16/2015  . Uncontrolled type 1 diabetes mellitus with hyperglycemia ([place]) 09/15/2015  . HAV (hallux abducto valgus) 12/20/2015  . Adductovarus rotation of toe, acquired 12/20/2015  . Painful breasts 06/14/2016  . History of fibrocystic disease of breast 06/14/2016  . Left  breast mass 07/12/2016  . Fibromyalgia 12/26/2016  . Vitamin D insufficiency 01/30/2017  . Low iron stores 01/30/2017  . SOB (shortness of breath) 02/02/2017  . No energy 02/02/2017  . Chest tightness 02/02/2017  . Weakness 02/02/2017  . RLS (restless legs syndrome) 12/18/2017  . Chronic idiopathic constipation 12/24/2018  . Posterior vitreous degeneration, bilateral 04/14/2019  . Tinea manuum 05/05/2019  . Toenail fungus 05/05/2019   Resolved Ambulatory Problems    Diagnosis Date Noted  . Type I diabetes mellitus with peripheral autonomic neuropathy ([place]) 11/02/2014   Past Medical History:  Diagnosis Date  . Bipolar 1 disorder ([place])   . Deafness in left ear   . Depression   . Diabetes mellitus without complication ([place])   . Osteoarthritis   . Pain of left breast   . Skin disorder    Reviewed med, allergy, problem list.      Observations/Objective: No acute distress.  Normal mood and appearance.  No labored breathing.   . Today's Vitals   07/18/19 0948  Weight: 138 lb (62.6 kg)  Height: 5\' 2"  (1.575 m)   Body mass index is 25.24 kg/m.    Assessment and Plan: Marland KitchenMarland KitchenBeaux was seen today for headache.  Diagnoses and all orders for this visit:  New daily persistent headache -     dexamethasone (DECADRON) 4 MG tablet; Take 3 tablets now for migraine rescue. -     amoxicillin-clavulanate (AUGMENTIN) 875-125 MG tablet; Take 1 tablet by mouth 2 (two) times daily.   Symptoms sound like migraine from sinus infection. Will treat for sinusitis with augmentin.  Headache treated with one dose of decadron. Follow up on Monday. No recent images of brain. May want to talk with ENT and see if any image better for cholesteatoma and evaluate headaches.    Follow Up Instructions:    I discussed the assessment and treatment plan with the patient. The patient was provided an opportunity to ask questions and all were answered. The patient agreed with the plan and demonstrated an  understanding of the instructions.   The patient was advised to call back or seek an in-person evaluation if the symptoms worsen or if the condition fails to improve as anticipated.   Iran Planas, PA-C

## 2019-07-21 DIAGNOSIS — H7191 Unspecified cholesteatoma, right ear: Secondary | ICD-10-CM | POA: Insufficient documentation

## 2019-07-30 ENCOUNTER — Telehealth: Payer: Self-pay | Admitting: Neurology

## 2019-07-30 ENCOUNTER — Encounter: Payer: Self-pay | Admitting: Physician Assistant

## 2019-07-30 ENCOUNTER — Ambulatory Visit (INDEPENDENT_AMBULATORY_CARE_PROVIDER_SITE_OTHER): Payer: Medicare Other | Admitting: Physician Assistant

## 2019-07-30 VITALS — Temp 97.5°F | Ht 62.0 in | Wt 138.0 lb

## 2019-07-30 DIAGNOSIS — B370 Candidal stomatitis: Secondary | ICD-10-CM | POA: Diagnosis not present

## 2019-07-30 MED ORDER — FLUCONAZOLE 150 MG PO TABS: 150.0000 mg | ORAL_TABLET | Freq: Once | ORAL | 0 refills | Status: AC

## 2019-07-30 MED ORDER — NYSTATIN 100000 UNIT/ML MT SUSP: 500000.0000 [IU] | Freq: Four times a day (QID) | OROMUCOSAL | 0 refills | Status: DC

## 2019-07-30 NOTE — Progress Notes (Signed)
Patient ID: Elainea Terpening, female   DOB: [birth year], 53 y.o.   MRN: [id number] .Marland KitchenVirtual Visit via Video Note  I connected with [name] on 07/30/19 at  2:20 PM EDT by a video enabled telemedicine application and verified that I am speaking with the correct person using two identifiers.  Location: Patient: home Provider: virtual   I discussed the limitations of evaluation and management by telemedicine and the availability of in person appointments. The patient expressed understanding and agreed to proceed.  History of Present Illness: Pt is a 53 yo female with T1DM who just finished augmentin for sinus infection calls in for irritated tongue and throat feeling with white to yellow "cottage cheese" like look to her tongue. Brushing her teeth hurts. Using mint toothpaste hurts. No cough, body aches, SOB. Not tried anything to make better.   .. Active Ambulatory Problems    Diagnosis Date Noted  . Bipolar 1 disorder with moderate mania ([place]) 11/02/2014  . Mild nonproliferative diabetic retinopathy ([place]) 12/22/2014  . Pseudophakia of both eyes 12/22/2014  . Abdominal pain 06/07/2015  . Internal hemorrhoids 08/16/2015  . Uncontrolled type 1 diabetes mellitus with hyperglycemia ([place]) 09/15/2015  . HAV (hallux abducto valgus) 12/20/2015  . Adductovarus rotation of toe, acquired 12/20/2015  . Painful breasts 06/14/2016  . History of fibrocystic disease of breast 06/14/2016  . Left breast mass 07/12/2016  . Fibromyalgia 12/26/2016  . Vitamin D insufficiency 01/30/2017  . Low iron stores 01/30/2017  . SOB (shortness of breath) 02/02/2017  . No energy 02/02/2017  . Chest tightness 02/02/2017  . Weakness 02/02/2017  . RLS (restless legs syndrome) 12/18/2017  . Chronic idiopathic constipation 12/24/2018  . Posterior vitreous degeneration, bilateral 04/14/2019  . Tinea manuum 05/05/2019  . Toenail fungus 05/05/2019  . 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   Resolved Ambulatory Problems     Diagnosis Date Noted  . Type I diabetes mellitus with peripheral autonomic neuropathy ([place]) 11/02/2014   Past Medical History:  Diagnosis Date  . Bipolar 1 disorder ([place])   . Deafness in left ear   . Depression   . Diabetes mellitus without complication ([place])   . Osteoarthritis   . Pain of left breast   . Skin disorder    Reviewed med, allergy, problem list.  Observations/Objective: No acute distress. No labored breathing.  Tongue erythematous with some thick white film over tongue.   .. Today's Vitals   07/30/19 1146  Temp: (!) 97.5 F (36.4 C)  TempSrc: Oral  Weight: 138 lb (62.6 kg)  Height: 5\' 2"  (1.575 m)   Body mass index is 25.24 kg/m.    Assessment and Plan: Marland KitchenMarland KitchenAmmarah was seen today for congestion.  Diagnoses and all orders for this visit:  Thrush -     fluconazole (DIFLUCAN) 150 MG tablet; Take 1 tablet (150 mg total) by mouth once for 1 dose. -     nystatin (MYCOSTATIN) 100000 UNIT/ML suspension; Take 5 mLs (500,000 Units total) by mouth 4 (four) times daily. Swish for 30 seconds and spit out for 7-14 days.   Pt just finished abx and diabetic herself. Appears like thrush. Given mouthwash for 7 days and one tablet of diflucan. Can use tylenol for discomfort and encouraged eating and drinking cold things.    Follow Up Instructions:    I discussed the assessment and treatment plan with the patient. The patient was provided an opportunity to ask questions and all were answered. The patient agreed with the plan and demonstrated an understanding  of the instructions.   The patient was advised to call back or seek an in-person evaluation if the symptoms worsen or if the condition fails to improve as anticipated.     Iran Planas, PA-C

## 2019-07-30 NOTE — Telephone Encounter (Signed)
Patient left vm stating her tongue is yellow and feels like she has "stuff in her throat". She wanted seen today. Can someone please call and triage her? Thanks.

## 2019-07-30 NOTE — Telephone Encounter (Signed)
Patient called back and was put on schedule for today.

## 2019-07-30 NOTE — Patient Instructions (Signed)
Oral Thrush, Adult  Oral thrush is an infection in your mouth and throat. It causes white patches on your tongue and in your mouth. Follow these instructions at home: Helping with soreness   To lessen your pain: ? Drink cold liquids, like water and iced tea. ? Eat frozen ice pops or frozen juices. ? Eat foods that are easy to swallow, like gelatin and ice cream. ? Drink from a straw if the patches in your mouth are painful. General instructions  Take or use over-the-counter and prescription medicines only as told by your doctor. Medicine for oral thrush may be something to swallow, or it may be something to put on the infected area.  Eat plain yogurt that has live cultures in it. Read the label to make sure.  If you wear dentures: ? Take out your dentures before you go to bed. ? Brush them well. ? Soak them in a denture cleaner.  Rinse your mouth with warm salt-water many times a day. To make the salt-water mixture, completely dissolve 1/2-1 teaspoon of salt in 1 cup of warm water. Contact a doctor if:  Your problems are getting worse.  Your problems do not get better in less than 7 days with treatment.  Your infection is spreading. This may show as white patches on the skin outside of your mouth.  You are nursing your baby and you have redness and pain in the nipples. This information is not intended to replace advice given to you by your health care provider. Make sure you discuss any questions you have with your health care provider. Document Released: 02/07/2010 Document Revised: 02/15/2018 Document Reviewed: 08/07/2016 Elsevier Patient Education  2020 Elsevier Inc.  

## 2019-07-30 NOTE — Progress Notes (Deleted)
2 day history Tongue - yellow "cottage cheese" look to her tongue Brushing teeth hurts tongue/throat Congestion in throat No cough

## 2019-08-08 ENCOUNTER — Telehealth: Payer: Self-pay | Admitting: Neurology

## 2019-08-08 MED ORDER — FLUCONAZOLE 150 MG PO TABS: 150.0000 mg | ORAL_TABLET | Freq: Once | ORAL | 0 refills | Status: AC

## 2019-08-08 NOTE — Telephone Encounter (Signed)
Sent diflucan

## 2019-08-08 NOTE — Telephone Encounter (Signed)
Patient made aware.

## 2019-08-08 NOTE — Telephone Encounter (Signed)
Patient left vm stating medication she was given for thrush did not help. Still having issues and asks for something else to be sent in to [facility] in [place]. Please advise.

## 2019-08-11 ENCOUNTER — Encounter: Payer: Self-pay | Admitting: Physician Assistant

## 2019-08-11 ENCOUNTER — Ambulatory Visit (INDEPENDENT_AMBULATORY_CARE_PROVIDER_SITE_OTHER): Payer: Medicare Other | Admitting: Physician Assistant

## 2019-08-11 ENCOUNTER — Other Ambulatory Visit: Payer: Self-pay

## 2019-08-11 VITALS — BP 118/65 | HR 90 | Ht 62.0 in | Wt 142.0 lb

## 2019-08-11 DIAGNOSIS — Z23 Encounter for immunization: Secondary | ICD-10-CM

## 2019-08-11 DIAGNOSIS — F319 Bipolar disorder, unspecified: Secondary | ICD-10-CM | POA: Diagnosis not present

## 2019-08-11 DIAGNOSIS — J029 Acute pharyngitis, unspecified: Secondary | ICD-10-CM | POA: Diagnosis not present

## 2019-08-11 DIAGNOSIS — T887XXA Unspecified adverse effect of drug or medicament, initial encounter: Secondary | ICD-10-CM

## 2019-08-11 DIAGNOSIS — N3281 Overactive bladder: Secondary | ICD-10-CM

## 2019-08-11 DIAGNOSIS — R350 Frequency of micturition: Secondary | ICD-10-CM

## 2019-08-11 MED ORDER — MIRABEGRON ER 25 MG PO TB24: 25.0000 mg | ORAL_TABLET | Freq: Every day | ORAL | 1 refills | Status: DC

## 2019-08-11 NOTE — Progress Notes (Signed)
Subjective:    Patient ID: Sheryl Matthews, female    DOB: 1966-01-31, 53 y.o.   MRN: [id number]  HPI  Pt is a 53 yo female with T1DM, Bipolar, constipation who presents to the clinic to follow up GI upset, sore throat and urinary frequency.   She has had some nausea and abdominal cramping since taking diflucan. She states she normal gets this. No fever, chills, diarrhea. Not tried anything to make better.   She is concerned that thrush is not gone. She continues to have sore kinda scratchy throat. No exudate, problems swallowing or discharge.   She wants to make sure she does not have UTI. She is going more frequency but that has been for months. She leaks often stress and urge.   .. Active Ambulatory Problems    Diagnosis Date Noted  . Bipolar 1 disorder with moderate mania ([place]) 11/02/2014  . Mild nonproliferative diabetic retinopathy ([place]) 12/22/2014  . Pseudophakia of both eyes 12/22/2014  . Abdominal pain 06/07/2015  . Internal hemorrhoids 08/16/2015  . Uncontrolled type 1 diabetes mellitus with hyperglycemia ([place]) 09/15/2015  . HAV (hallux abducto valgus) 12/20/2015  . Adductovarus rotation of toe, acquired 12/20/2015  . Painful breasts 06/14/2016  . History of fibrocystic disease of breast 06/14/2016  . Left breast mass 07/12/2016  . Fibromyalgia 12/26/2016  . Vitamin D insufficiency 01/30/2017  . Low iron stores 01/30/2017  . SOB (shortness of breath) 02/02/2017  . No energy 02/02/2017  . Chest tightness 02/02/2017  . Weakness 02/02/2017  . RLS (restless legs syndrome) 12/18/2017  . Chronic idiopathic constipation 12/24/2018  . Posterior vitreous degeneration, bilateral 04/14/2019  . Tinea manuum 05/05/2019  . Toenail fungus 05/05/2019  . 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  . OAB (overactive bladder) 08/12/2019   Resolved Ambulatory Problems    Diagnosis Date Noted  . Type I diabetes mellitus with peripheral autonomic neuropathy ([place]) 11/02/2014   Past  Medical History:  Diagnosis Date  . Bipolar 1 disorder ([place])   . Deafness in left ear   . Depression   . Diabetes mellitus without complication ([place])   . Osteoarthritis   . Pain of left breast   . Skin disorder      Review of Systems See HPI.     Objective:   Physical Exam Vitals signs reviewed.  Constitutional:      Appearance: She is well-developed.  HENT:     Head: Normocephalic.     Mouth/Throat:     Pharynx: Oropharynx is clear. No pharyngeal swelling or oropharyngeal exudate.     Comments: No signs of thrush. Clear drainage noted on oropharynx.  Eyes:     Extraocular Movements: Extraocular movements intact.     Pupils: Pupils are equal, round, and reactive to light.  Cardiovascular:     Rate and Rhythm: Normal rate and regular rhythm.  Pulmonary:     Effort: Pulmonary effort is normal.  Abdominal:     General: Bowel sounds are normal.     Palpations: Abdomen is soft.     Tenderness: There is no abdominal tenderness. There is no right CVA tenderness or left CVA tenderness.  Neurological:     General: No focal deficit present.     Mental Status: She is alert.  Psychiatric:        Mood and Affect: Mood normal.           Assessment & Plan:  Marland KitchenMarland KitchenElizabella was seen today for abdominal pain.  Diagnoses and all orders for  this visit:  Medication side effect  Flu vaccine need -     Flu Vaccine QUAD 36+ mos IM  Urinary frequency -     POCT Urinalysis Dipstick  Sore throat  OAB (overactive bladder) -     mirabegron ER (MYRBETRIQ) 25 MG TB24 tablet; Take 1 tablet (25 mg total) by mouth daily.    I don't see any more evidence of thrush. Do not take last diflucan tablet since it upset your stomach.   Reassurance given about ST. No signs of infection.There is some clear drainage. You could start zyrtec at bedtime and flonase during the day for more allergy symptoms. Follow up as needed.    Results for orders placed or performed in visit on 08/11/19  POCT  Urinalysis Dipstick  Result Value Ref Range   Color, UA yellow    Clarity, UA clear    Glucose, UA Negative Negative   Bilirubin, UA negative    Ketones, UA negative    Spec Grav, UA >=1.030 (A) 1.010 - 1.025   Blood, UA negative    pH, UA 5.5 5.0 - 8.0   Protein, UA Negative Negative   Urobilinogen, UA 0.2 0.2 or 1.0 E.U./dL   Nitrite, UA negative    Leukocytes, UA Negative Negative   Appearance     Odor      UA negative. No need for culture.  Symptoms seem classic OAB. Will start mybetriq. Discussed kegals. Follow up in 1-2 months.

## 2019-08-11 NOTE — Patient Instructions (Signed)

## 2019-08-12 ENCOUNTER — Encounter: Payer: Self-pay | Admitting: Physician Assistant

## 2019-08-12 DIAGNOSIS — N3281 Overactive bladder: Secondary | ICD-10-CM | POA: Insufficient documentation

## 2019-08-12 LAB — POCT URINALYSIS DIPSTICK
Bilirubin, UA: NEGATIVE
Blood, UA: NEGATIVE
Glucose, UA: NEGATIVE
Ketones, UA: NEGATIVE
Leukocytes, UA: NEGATIVE
Nitrite, UA: NEGATIVE
Protein, UA: NEGATIVE
Spec Grav, UA: >=1.03 — AB (ref 1.010–1.025)
Urobilinogen, UA: 0.2 E.U./dL
pH, UA: 5.5 (ref 5.0–8.0)

## 2019-08-13 ENCOUNTER — Telehealth: Payer: Self-pay | Admitting: Physician Assistant

## 2019-08-13 NOTE — Telephone Encounter (Signed)
Approved today (Myrbetriq) [id number];Review Type:Prior Auth;Coverage Start Date:07/14/2019;Coverage End Date:11/26/2098. Pharmacy aware and form sent to scan.

## 2019-08-18 ENCOUNTER — Telehealth: Payer: Self-pay | Admitting: Neurology

## 2019-08-18 DIAGNOSIS — K921 Melena: Secondary | ICD-10-CM | POA: Diagnosis not present

## 2019-08-18 DIAGNOSIS — K5900 Constipation, unspecified: Secondary | ICD-10-CM | POA: Diagnosis not present

## 2019-08-18 DIAGNOSIS — K219 Gastro-esophageal reflux disease without esophagitis: Secondary | ICD-10-CM | POA: Diagnosis not present

## 2019-08-18 NOTE — Telephone Encounter (Signed)
I did not see any evidence of thrush on exam when you came in here. What symptoms are you still having? It could be dry mouth. Try biotene OTC. We could send you to ENT as well since you have had 2 rounds of treatment and mouthwash.

## 2019-08-18 NOTE — Telephone Encounter (Signed)
Patient left vm stating thrush is still present. Please advise.

## 2019-08-18 NOTE — Telephone Encounter (Signed)
Patient states feels like there is something in the back of her throat and still sees yellow on tongue when she looks in magnifying glass. She was offered advise and she is going to go to ENT. She already has a doctor and declined new referral.

## 2019-08-22 DIAGNOSIS — H1033 Unspecified acute conjunctivitis, bilateral: Secondary | ICD-10-CM | POA: Diagnosis not present

## 2019-08-22 DIAGNOSIS — B370 Candidal stomatitis: Secondary | ICD-10-CM | POA: Diagnosis not present

## 2019-08-22 DIAGNOSIS — H7292 Unspecified perforation of tympanic membrane, left ear: Secondary | ICD-10-CM | POA: Diagnosis not present

## 2019-08-26 DIAGNOSIS — R531 Weakness: Secondary | ICD-10-CM | POA: Diagnosis not present

## 2019-08-26 DIAGNOSIS — M47812 Spondylosis without myelopathy or radiculopathy, cervical region: Secondary | ICD-10-CM | POA: Diagnosis not present

## 2019-08-26 DIAGNOSIS — M3500 Sicca syndrome, unspecified: Secondary | ICD-10-CM | POA: Diagnosis not present

## 2019-08-26 DIAGNOSIS — M797 Fibromyalgia: Secondary | ICD-10-CM | POA: Diagnosis not present

## 2019-08-26 DIAGNOSIS — R5382 Chronic fatigue, unspecified: Secondary | ICD-10-CM | POA: Diagnosis not present

## 2019-09-01 DIAGNOSIS — B351 Tinea unguium: Secondary | ICD-10-CM | POA: Diagnosis not present

## 2019-09-01 DIAGNOSIS — B352 Tinea manuum: Secondary | ICD-10-CM | POA: Diagnosis not present

## 2019-09-01 DIAGNOSIS — L309 Dermatitis, unspecified: Secondary | ICD-10-CM | POA: Diagnosis not present

## 2019-09-02 ENCOUNTER — Other Ambulatory Visit: Payer: Self-pay

## 2019-09-02 ENCOUNTER — Other Ambulatory Visit (INDEPENDENT_AMBULATORY_CARE_PROVIDER_SITE_OTHER): Payer: Medicare Other

## 2019-09-02 DIAGNOSIS — E7800 Pure hypercholesterolemia, unspecified: Secondary | ICD-10-CM

## 2019-09-02 DIAGNOSIS — E1065 Type 1 diabetes mellitus with hyperglycemia: Secondary | ICD-10-CM | POA: Diagnosis not present

## 2019-09-02 LAB — BASIC METABOLIC PANEL
BUN: 16 mg/dL (ref 6–23)
CO2: 31 mEq/L (ref 19–32)
Calcium: 9 mg/dL (ref 8.4–10.5)
Chloride: 102 mEq/L (ref 96–112)
Creatinine, Ser: 0.76 mg/dL (ref 0.40–1.20)
GFR: 79.08 mL/min (ref >60.00)
Glucose, Bld: 139 mg/dL — ABNORMAL HIGH (ref 70–99)
Potassium: 4.2 mEq/L (ref 3.5–5.1)
Sodium: 137 mEq/L (ref 135–145)

## 2019-09-02 LAB — LDL CHOLESTEROL, DIRECT: Direct LDL: 85 mg/dL

## 2019-09-02 LAB — HEMOGLOBIN A1C: Hgb A1c MFr Bld: 7.8 % — ABNORMAL HIGH (ref 4.6–6.5)

## 2019-09-07 NOTE — Progress Notes (Signed)
Patient ID: Sheryl Matthews, female   DOB: [birth year], 53 y.o.   MRN: [id number]          Reason for Appointment : Follow-up for Type 1 Diabetes  History of Present Illness          Diagnosis: Type 1 diabetes mellitus, date of diagnosis:  at age 84    Previous history:   She has been on insulin since the time of diagnosis. She was on the same program of NPH and Regular Insulin since diagnosis was made when she was first seen here  Recent history:   INSULIN regimen is described as:  Lantus 8 units a.m.-- 12 units in p.m., Novolog 5-8 units,  1: 10 carbohydrate ratio, using. 1:50 correction.  Her A1c again about the same at 7.8   Glucose patterns, management and problems identified:    She has significant variability in her blood sugars especially overnight and late afternoons  Also her blood sugars were showing marked increases in the first week of her CGM recording compared to the second week  She did get dexamethasone from her PCP in August for headaches  Again most of her high sugars are related to eating larger portions are snacks without coverage and sometimes treating low normal or low sugars excessively  LANTUS was increased to 12 units on the last visit with some improvement in her overnight blood sugars  She is still not comfortable wanting to try and use her pump  HIGHEST blood sugars are overnight or around 4-5 PM    She thinks she is eating less junk food overall but still getting too many snacks like popcorn in the evening at times  Hypoglycemia has been not documented much on her CGM but occasionally has been related to overcorrection of excessive coverage at dinnertime  Overall she has more tendency to low sugars on the day she is exercising  Low sugar may be more often after coming back from the gym and then continuing to be active at home and not making reductions in her breakfast,  Her breakfast she is usually 40 to 50 g of carbohydrate with eggs for  protein     CONTINUOUS GLUCOSE MONITORING RECORD INTERPRETATION    Dates of Recording: 9/23 through 10/6  Sensor description: Dexcom G6  Results statistics:   CGM use % of time  99  Average and SD  183+/-82  Time in range     50   %  % Time Above 180  46  % Time above 250  20  % Time Below target  4    Glycemic patterns summary:   Hyperglycemic episodes   most frequently overnight with blood sugars rising generally after 11 PM through about 5 AM.  I also has some periods of hyperglycemia between about 3-5 PM.  These are both inconsistent Rarely will have higher blood sugars between 7-9 AM  Hypoglycemic episodes occurred infrequently with very occasional episodes late morning or around 7-8 PM, usually transient  Overnight periods: Blood sugars are mostly running high with average of over 200 between midnight and 3 AM mostly and only rarely low normal 4 AM.  Blood sugars are then declining and now down to about average 150 around 7-8 AM  Preprandial periods: Blood sugars are running about 180 at breakfast and lunch and about 140-150 at dinnertime However has tendency to some low sugars around 7-8 PM, not treated this before or after eating  Postprandial periods:   After breakfast:  There is significant variability but blood sugars are lower than average flat after breakfast  After lunch:   Blood sugars are rising modestly but appear to be on an average continuing to increase until about 5 PM likely from snacks  After dinner: Mealtimes are not known or consistent and your blood sugars are generally flat to lower in the evenings   Previous data:   CGM use % of time  96  Average and SD 177  Time in range      50 %, was 44  % Time Above 180  43  % Time above 250  19  % Time Below target  7.1     Diabetes labs:  Lab Results  Component Value Date   HGBA1C 7.8 (H) 09/02/2019   HGBA1C 7.9 (H) 04/28/2019   HGBA1C 8.0 (H) 01/31/2019   Lab Results  Component Value  Date   MICROALBUR 80 05/02/2019   LDLCALC 119 (H) 04/28/2019   CREATININE 0.76 09/02/2019    Lab Results  Component Value Date   FRUCTOSAMINE 347 (H) 07/12/2018   FRUCTOSAMINE 340 (H) 06/11/2017     Allergies as of 09/08/2019      Reactions   Latex Rash   Tape Rash   Elavil [amitriptyline Hcl]    Groggy/constipation/did not think worked    Lyrica [pregabalin]    Did not have benefit.  Falling asleep. Feeling depressed.      Medication List       Accurate as of September 07, 2019  9:07 PM. If you have any questions, ask your nurse or doctor.        ALPRAZolam 0.5 MG tablet Commonly known as: XANAX 1 tablet   buPROPion 300 MG 24 hr tablet Commonly known as: WELLBUTRIN XL Take 300 mg by mouth daily.   dexamethasone 4 MG tablet Commonly known as: DECADRON Take 3 tablets now for migraine rescue.   FLUoxetine 20 MG capsule Commonly known as: PROZAC Take 4 capsules (80 mg total) by mouth daily.   fluticasone 50 MCG/ACT nasal spray Commonly known as: FLONASE USE 1 SPRAY IN EACH NOSTRIL DAILY   FREESTYLE LITE test strip Generic drug: glucose blood USE AS INSTRUCTED TO CHECK BLOOD SUGAR SIX TIMES PER DAY. DX:E10.65   gabapentin 300 MG capsule Commonly known as: NEURONTIN TAKE 1 CAPSULE DAILY.   glucagon 1 MG injection Commonly known as: Glucagon Emergency INJECT 1 MG INTO THE MUSCLE FOR SEVERE HYPOGLYCEMIA   ibuprofen 200 MG tablet Commonly known as: ADVIL Take 200 mg by mouth as needed.   insulin aspart 100 UNIT/ML FlexPen Commonly known as: NovoLOG FlexPen Inject 7-8 Units into the skin 3 (three) times daily with meals. Inject 7-8 units under the skin three times daily before meals.   Insulin Pen Needle 32G X 4 MM Misc Commonly known as: BD Pen Needle Nano U/F Use 7 per day to inject insulin   Ketostix strip Generic drug: acetone (urine) test USE TO CHECK URINE KETONES IF BLOOD SUGAR IS OVER 300 AS DIRECTED BY PHYSICIAN   mirabegron ER 25 MG Tb24  tablet Commonly known as: MYRBETRIQ Take 1 tablet (25 mg total) by mouth daily.   nystatin 100000 UNIT/ML suspension Commonly known as: MYCOSTATIN Take 5 mLs (500,000 Units total) by mouth 4 (four) times daily. Swish for 30 seconds and spit out for 7-14 days.   ranitidine 300 MG tablet Commonly known as: ZANTAC Take 300 mg by mouth daily.       Allergies:  Allergies  Allergen Reactions  . Latex Rash  . Tape Rash  . Elavil [Amitriptyline Hcl]     Groggy/constipation/did not think worked   . Lyrica [Pregabalin]     Did not have benefit.  Falling asleep. Feeling depressed.    Past Medical History:  Diagnosis Date  . Bipolar 1 disorder ([place])   . Deafness in left ear   . Depression   . Diabetes mellitus without complication ([place])   . Osteoarthritis   . Pain of left breast   . Skin disorder     Past Surgical History:  Procedure Laterality Date  . ANKLE FRACTURE SURGERY Left   . BREAST ENHANCEMENT SURGERY    . BREAST EXCISIONAL BIOPSY Left   . CERVICAL DISC SURGERY    . CESAREAN SECTION    . cosmetic surgery facial    . ELBOW SURGERY Right   . GALLBLADDER SURGERY    . INNER EAR SURGERY    . MINOR CARPAL TUNNEL Left     Family History  Problem Relation Age of Onset  . Depression Sister   . Alcohol abuse Brother   . Diabetes Brother   . Depression Brother   . Breast cancer Maternal Aunt     Social History:  reports that she has never smoked. She has never used smokeless tobacco. She reports current alcohol use. She reports that she does not use drugs.     ROS       Lipids: LDL, Not on any statins, LDL is better with cutting back on junk food  Lab Results  Component Value Date   CHOL 217 (H) 04/28/2019   HDL 83.90 04/28/2019   LDLCALC 119 (H) 04/28/2019   LDLDIRECT 85.0 09/02/2019   TRIG 70.0 04/28/2019   CHOLHDL 3 04/28/2019    Depression: She is taking Wellbutrin and Prozac      She has a history of Numbness and, tingling in her feet    She thinks she has a lot of joint pains but has been told to have fibromyalgia, was tried on naltrexone by rheumatologist   Physical Examination:  LMP 01/09/2018     ASSESSMENT:  Diabetes type 1, long-standing and persistently poorly controlled  See history of present illness for detailed discussion of current diabetes management, blood sugar patterns and problems identified  Her A1c is 7.8 %  She still has difficulty managing her insulin and covering her meals and snacks appropriately She has started going to the gym for exercise in the early morning and this is likely helping with some of the days She is concerned about low sugars and tends to overtreat these Again no consistent pattern seen and blood sugars highly variable Fasting readings are relatively better  Her day-to-day management of insulin, exercise, diet and treatment of high and low sugars was discussed in detail  Again she does not think she is capable of learning how to use the insulin pump  HYPERLIPIDEMIA: LDL is improving  PLAN:    She will continue the same dose of Lantus However on the day she is going to the gym she can reduce the morning dose to 7 units She will make sure she will take 1 or 2 units less for breakfast on the morning she is coming back after exercise Also try to anticipate her sugars going low or if she is doing a lot of housework Make sure she is taking her NovoLog before starting the weight most of her meals She will need to take additional coverage  for snacks and if she is eating well a second helping of food in the evenings after dinner any other time Avoid overtreating hypoglycemia and use glucose tablets rather than food especially when she is exercising  Check urine microalbumin on the next visit  Follow-up in 3 months  There are no Patient Instructions on file for this visit.  Counseling time on subjects discussed in assessment and plan sections is over 50% of today's 25  minute visit     Elayne Snare 09/07/2019, 9:07 PM   Note: This note was prepared with Dragon voice recognition system technology. Any transcriptional errors that result from this process are unintentional.    Elayne Snare

## 2019-09-08 ENCOUNTER — Other Ambulatory Visit: Payer: Self-pay

## 2019-09-08 ENCOUNTER — Encounter: Payer: Self-pay | Admitting: Endocrinology

## 2019-09-08 ENCOUNTER — Ambulatory Visit (INDEPENDENT_AMBULATORY_CARE_PROVIDER_SITE_OTHER): Payer: Medicare Other | Admitting: Endocrinology

## 2019-09-08 VITALS — Ht 62.0 in

## 2019-09-08 DIAGNOSIS — E7800 Pure hypercholesterolemia, unspecified: Secondary | ICD-10-CM

## 2019-09-08 DIAGNOSIS — E1065 Type 1 diabetes mellitus with hyperglycemia: Secondary | ICD-10-CM

## 2019-09-09 ENCOUNTER — Other Ambulatory Visit: Payer: Self-pay | Admitting: Endocrinology

## 2019-09-10 ENCOUNTER — Telehealth: Payer: Self-pay

## 2019-09-10 NOTE — Telephone Encounter (Signed)
Patient called in requesting  5 emergency glucose  kits..  Please advise

## 2019-09-11 ENCOUNTER — Other Ambulatory Visit: Payer: Self-pay

## 2019-09-11 MED ORDER — GLUCAGON EMERGENCY 1 MG IJ KIT: PACK | INTRAMUSCULAR | 3 refills | Status: DC

## 2019-09-11 NOTE — Telephone Encounter (Signed)
Called pt and gave her MD message. Pt verbalized understanding. 

## 2019-09-11 NOTE — Telephone Encounter (Signed)
Rx was sent  

## 2019-09-11 NOTE — Telephone Encounter (Signed)
Ok to send 5?

## 2019-09-11 NOTE — Telephone Encounter (Signed)
Normally do not prescribe more than 3 at a time.  Need to make sure she is having a family member available to inject this when she is unable to treat the low sugar herself

## 2019-09-15 ENCOUNTER — Telehealth: Payer: Self-pay | Admitting: Endocrinology

## 2019-09-15 ENCOUNTER — Other Ambulatory Visit: Payer: Self-pay

## 2019-09-15 MED ORDER — DEXCOM G6 SENSOR MISC: 1.0000 | 2 refills | Status: DC

## 2019-09-15 MED ORDER — GLUCAGON EMERGENCY 1 MG IJ KIT: PACK | INTRAMUSCULAR | 3 refills | Status: DC

## 2019-09-15 NOTE — Telephone Encounter (Signed)
Rx sent.  It was previously sent to local retail pharmacy because pt expressed that she was completely out and she has been having frequent hypoglycemic events. She did not specify which pharmacy she wanted them to go to. For this reason, it was sent to local retail pharmacy.

## 2019-09-15 NOTE — Telephone Encounter (Signed)
Patient requests a RX for G6 sensor (1) sent asap to: [facility] on [address] in [place] Patient states urgent

## 2019-09-15 NOTE — Telephone Encounter (Signed)
Rx sent 

## 2019-09-15 NOTE — Telephone Encounter (Signed)
Patient states the RX for below was sent to the wrong PHARM. Please send the following RX to the [facility] asap (needs before 09/18/19) listed below:  MEDICATION: glucagon (GLUCAGON EMERGENCY) 1 MG injection  PHARMACY:   [facility], [place] [phone] (Phone) [phone] (Fax)    IS THIS A 90 DAY SUPPLY : If possible  IS PATIENT OUT OF MEDICATION: Yes  IF NOT; HOW MUCH IS LEFT: 0  LAST APPOINTMENT DATE: @10 /19/2020  NEXT APPOINTMENT DATE:@Visit  date not found  DO WE HAVE YOUR PERMISSION TO LEAVE A DETAILED MESSAGE: yes  OTHER COMMENTS:    **Let patient know to contact pharmacy at the end of the day to make sure medication is ready. **  ** Please notify patient to allow 48-72 hours to process**  **Encourage patient to contact the pharmacy for refills or they can request refills through [facility]**

## 2019-09-17 ENCOUNTER — Other Ambulatory Visit: Payer: Self-pay

## 2019-09-17 ENCOUNTER — Telehealth: Payer: Self-pay

## 2019-09-17 MED ORDER — DEXCOM G6 SENSOR MISC: 1.0000 | 2 refills | Status: DC

## 2019-09-17 NOTE — Telephone Encounter (Signed)
Called [employer] pharmacy benefits and they stated that the pt is not going to be covered for Dexcom sensors under her pharmacy benefits plan because it is a plan exclusion.

## 2019-09-17 NOTE — Telephone Encounter (Signed)
She will need check to see if she can use the guardian sensor from [employer], she may have received this with her pump.  Will need instructions on this

## 2019-09-17 NOTE — Telephone Encounter (Signed)
Called pt and informed her of this information below and she stated that she would purchase the dexcom supplies out of pocket.

## 2019-09-24 NOTE — Telephone Encounter (Signed)
Sheryl Matthews reports that she wanted a prescription called in for Dexcom so that she could have extra if one falls off.  It turns out that she will have to pay out of pocket, and she can not afford them.  She was told that [facility] has the cheepest price for this.  She reports having no problems with her pump or sensors at this time.

## 2019-10-10 DIAGNOSIS — R110 Nausea: Secondary | ICD-10-CM | POA: Diagnosis not present

## 2019-10-10 DIAGNOSIS — R5383 Other fatigue: Secondary | ICD-10-CM | POA: Diagnosis not present

## 2019-10-10 DIAGNOSIS — J029 Acute pharyngitis, unspecified: Secondary | ICD-10-CM | POA: Diagnosis not present

## 2019-10-10 DIAGNOSIS — U071 COVID-19: Secondary | ICD-10-CM | POA: Diagnosis not present

## 2019-10-10 DIAGNOSIS — R05 Cough: Secondary | ICD-10-CM | POA: Diagnosis not present

## 2019-10-11 DIAGNOSIS — R05 Cough: Secondary | ICD-10-CM | POA: Diagnosis not present

## 2019-10-11 DIAGNOSIS — R110 Nausea: Secondary | ICD-10-CM | POA: Diagnosis not present

## 2019-10-11 DIAGNOSIS — J029 Acute pharyngitis, unspecified: Secondary | ICD-10-CM | POA: Diagnosis not present

## 2019-10-11 DIAGNOSIS — R5383 Other fatigue: Secondary | ICD-10-CM | POA: Diagnosis not present

## 2019-11-05 ENCOUNTER — Ambulatory Visit (INDEPENDENT_AMBULATORY_CARE_PROVIDER_SITE_OTHER): Payer: Medicare Other | Admitting: Physician Assistant

## 2019-11-05 ENCOUNTER — Other Ambulatory Visit: Payer: Self-pay

## 2019-11-05 VITALS — BP 132/70 | HR 100 | Temp 98.7°F | Ht 62.0 in | Wt 134.0 lb

## 2019-11-05 DIAGNOSIS — R112 Nausea with vomiting, unspecified: Secondary | ICD-10-CM

## 2019-11-05 DIAGNOSIS — R1319 Other dysphagia: Secondary | ICD-10-CM

## 2019-11-05 DIAGNOSIS — R1030 Lower abdominal pain, unspecified: Secondary | ICD-10-CM | POA: Diagnosis not present

## 2019-11-05 DIAGNOSIS — R1310 Dysphagia, unspecified: Secondary | ICD-10-CM | POA: Diagnosis not present

## 2019-11-05 DIAGNOSIS — R350 Frequency of micturition: Secondary | ICD-10-CM

## 2019-11-05 LAB — POCT URINALYSIS DIP (CLINITEK)
Bilirubin, UA: NEGATIVE
Blood, UA: NEGATIVE
Glucose, UA: >=1000 mg/dL — AB
Leukocytes, UA: NEGATIVE
Nitrite, UA: NEGATIVE
POC PROTEIN,UA: NEGATIVE
Spec Grav, UA: 1.025 (ref 1.010–1.025)
Urobilinogen, UA: 0.2 E.U./dL
pH, UA: 7 (ref 5.0–8.0)

## 2019-11-05 MED ORDER — METOCLOPRAMIDE HCL 10 MG PO TABS: 5.0000 mg | ORAL_TABLET | Freq: Three times a day (TID) | ORAL | 1 refills | Status: DC

## 2019-11-05 NOTE — Progress Notes (Addendum)
Subjective:     Patient ID: Sheryl Matthews, female   DOB: 01-17-1966, 53 y.o.   MRN: [id number]  HPI Pt is a 53 yo female with a hx of type 1 Diabetes, over active bladder, and GERD who presents to the clinic for possible possible UTI.   Overall, hasn't been feeling well.  She is positive for polyuria and dysuria. Denies foul odor or vaginal irritation or discharge.  Admits to no new sexual partners. Pt states symptoms began in October. She tries to stay hydrated.   She is positive for abdominal pain. Admits to N/V. States episodes of vomiting occur 2-3 X/month. Not associated with eating. Denies diarrhea. Pt states she does not have daily bowel movements. Notes a decreased appetite at times.  She also admits to trouble swallowing foods, and globus sensations. Pt is unsure if these episodes are GERD related.   Pt admits to throat pain which began 3 weeks ago.  Concerned the thrush has returned.  Past thrust infection diagnosed in September and treated with flucanozole/ nystatin.  She denies fever and chills.   .. Active Ambulatory Problems    Diagnosis Date Noted  . Bipolar 1 disorder with moderate mania ([place]) 11/02/2014  . Mild nonproliferative diabetic retinopathy ([place]) 12/22/2014  . Pseudophakia of both eyes 12/22/2014  . Abdominal pain 06/07/2015  . Internal hemorrhoids 08/16/2015  . Uncontrolled type 1 diabetes mellitus with hyperglycemia ([place]) 09/15/2015  . HAV (hallux abducto valgus) 12/20/2015  . Adductovarus rotation of toe, acquired 12/20/2015  . Painful breasts 06/14/2016  . History of fibrocystic disease of breast 06/14/2016  . Left breast mass 07/12/2016  . Fibromyalgia 12/26/2016  . Vitamin D insufficiency 01/30/2017  . Low iron stores 01/30/2017  . SOB (shortness of breath) 02/02/2017  . No energy 02/02/2017  . Chest tightness 02/02/2017  . Weakness 02/02/2017  . RLS (restless legs syndrome) 12/18/2017  . Chronic idiopathic constipation 12/24/2018  . Posterior  vitreous degeneration, bilateral 04/14/2019  . Tinea manuum 05/05/2019  . Toenail fungus 05/05/2019  . 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  . OAB (overactive bladder) 08/12/2019   Resolved Ambulatory Problems    Diagnosis Date Noted  . Type I diabetes mellitus with peripheral autonomic neuropathy ([place]) 11/02/2014   Past Medical History:  Diagnosis Date  . Bipolar 1 disorder ([place])   . Deafness in left ear   . Depression   . Diabetes mellitus without complication ([place])   . Osteoarthritis   . Pain of left breast   . Skin disorder      Review of Systems See HPI    Objective:   Physical Exam Constitutional:      Appearance: She is well-developed and well-nourished.  HENT:     Head: Normocephalic and atraumatic.     Mouth/Throat:     Mouth: Oropharynx is clear and moist.     Pharynx: Oropharynx is clear. No pharyngeal swelling or oropharyngeal exudate.  Eyes:     Conjunctiva/sclera: Conjunctivae normal.  Neck:     Thyroid: No thyromegaly.  Cardiovascular:     Rate and Rhythm: Normal rate and regular rhythm.     Heart sounds: Normal heart sounds.  Pulmonary:     Effort: Pulmonary effort is normal.     Breath sounds: Normal breath sounds.  Abdominal:     Palpations: Abdomen is soft.     Tenderness: There is abdominal tenderness in the epigastric area and left upper quadrant. There is no right CVA tenderness, left CVA tenderness or guarding. Negative  signs include Murphy's sign and McBurney's sign.  Musculoskeletal:        General: Normal range of motion.     Cervical back: Normal range of motion and neck supple.  Skin:    General: Skin is warm and dry.  Neurological:     Mental Status: She is alert and oriented to person, place, and time.  Psychiatric:        Mood and Affect: Mood and affect normal.        Assessment:     Marland KitchenMarland KitchenKesiah was seen today for abdominal pain.  Diagnoses and all orders for this visit:  Urinary frequency -     POCT URINALYSIS DIP  (CLINITEK) -     Urine Culture  Lower abdominal pain -     POCT URINALYSIS DIP (CLINITEK) -     Urine Culture  Nausea and vomiting, intractability of vomiting not specified, unspecified vomiting type -     TSH -     Lipase -     COMPLETE METABOLIC PANEL WITH GFR -     CBC -     metoCLOPramide (REGLAN) 10 MG tablet; Take 0.5 tablets (5 mg total) by mouth 3 (three) times daily before meals.  Esophageal dysphagia -     TSH -     metoCLOPramide (REGLAN) 10 MG tablet; Take 0.5 tablets (5 mg total) by mouth 3 (three) times daily before meals.      Plan:    Physical exam revealed no signs of oral thrust remaining. Reassurance given.    No signs of infection noted in the dip UA, sent for culture due to pts complaints of polyuria/ dysuria.   N/V and abdominal pain could be gastroparesis. She has GI doctor following esophageal dysphagia. This is probable  due to pts hx of diabetes and curent sx of constipation, decreased appetite and trouble eating. Start reglan to increase GI- motility. Discussed small meals. Encouraged very close glucose control to help with symptoms. Pt to f/u with gastroenterology for motility studies.   Serum Lipase ordered to assess pancreatic etiology.   TSH, CBC and CMP ordered   Pt f/u as needed with PCP.  Marland KitchenVernetta Honey PA-C, have reviewed and agree with the above documentation in it's entirety.   Marland Kitchen.Spent 30 minutes with patient and greater than 50 percent of visit spent counseling patient regarding treatment plan.

## 2019-11-05 NOTE — Patient Instructions (Addendum)
Gastroparesis  Gastroparesis is a condition in which food takes longer than normal to empty from the stomach. The condition is usually long-lasting (chronic). It may also be called delayed gastric emptying. There is no cure, but there are treatments and things that you can do at home to help relieve symptoms. Treating the underlying condition that causes gastroparesis can also help relieve symptoms. What are the causes? In many cases, the cause of this condition is not known. Possible causes include:  A hormone (endocrine) disorder, such as hypothyroidism or diabetes.  A nervous system disease, such as Parkinson's disease or multiple sclerosis.  Cancer, infection, or surgery that affects the stomach or vagus nerve. The vagus nerve runs from your chest, through your neck, to the lower part of your brain.  A connective tissue disorder, such as scleroderma.  Certain medicines. What increases the risk? You are more likely to develop this condition if you:  Have certain disorders or diseases, including: ? An endocrine disorder. ? An eating disorder. ? Amyloidosis. ? Scleroderma. ? Parkinson's disease. ? Multiple sclerosis. ? Cancer or infection of the stomach or the vagus nerve.  Have had surgery on the stomach or vagus nerve.  Take certain medicines.  Are female. What are the signs or symptoms? Symptoms of this condition include:  Feeling full after eating very little.  Nausea.  Vomiting.  Heartburn.  Abdominal bloating.  Inconsistent blood sugar (glucose) levels on blood tests.  Lack of appetite.  Weight loss.  Acid from the stomach coming up into the esophagus (gastroesophageal reflux).  Sudden tightening (spasm) of the stomach, which can be painful. Symptoms may come and go. Some people may not notice any symptoms. How is this diagnosed? This condition is diagnosed with tests, such as:  Tests that check how long it takes food to move through the stomach and  intestines. These tests include: ? Upper gastrointestinal (GI) series. For this test, you drink a liquid that shows up well on X-rays, and then X-rays will be taken of your intestines. ? Gastric emptying scintigraphy. For this test, you eat food that contains a small amount of radioactive material, and then scans are taken. ? Wireless capsule GI monitoring system. For this test, you swallow a pill (capsule) that records information about how foods and fluid move through your stomach.  Gastric manometry. For this test, a tube is passed down your throat and into your stomach to measure electrical and muscular activity.  Endoscopy. For this test, a long, thin tube is passed down your throat and into your stomach to check for problems in your stomach lining.  Ultrasound. This test uses sound waves to create images of inside the body. This can help rule out gallbladder disease or pancreatitis as a cause of your symptoms. How is this treated? There is no cure for gastroparesis. Treatment may include:  Treating the underlying cause.  Managing your symptoms by making changes to your diet and exercise habits.  Taking medicines to control nausea and vomiting and to stimulate stomach muscles.  Getting food through a feeding tube in the hospital. This may be done in severe cases.  Having surgery to insert a device into your body that helps improve stomach emptying and control nausea and vomiting (gastric neurostimulator). Follow these instructions at home:  Take over-the-counter and prescription medicines only as told by your health care provider.  Follow instructions from your health care provider about eating or drinking restrictions. Your health care provider may recommend that you: ? Eat   smaller meals more often. ? Eat low-fat foods. ? Eat low-fiber forms of high-fiber foods. For example, eat cooked vegetables instead of raw vegetables. ? Have only liquid foods instead of solid foods. Liquid  foods are easier to digest.  Drink enough fluid to keep your urine pale yellow.  Exercise as often as told by your health care provider.  Keep all follow-up visits as told by your health care provider. This is important. Contact a health care provider if you:  Notice that your symptoms do not improve with treatment.  Have new symptoms. Get help right away if you:  Have severe abdominal pain that does not improve with treatment.  Have nausea that is severe or does not go away.  Cannot drink fluids without vomiting. Summary  Gastroparesis is a chronic condition in which food takes longer than normal to empty from the stomach.  Symptoms include nausea, vomiting, heartburn, abdominal bloating, and loss of appetite.  Eating smaller portions, and low-fat, low-fiber foods may help you manage your symptoms.  Get help right away if you have severe abdominal pain. This information is not intended to replace advice given to you by your health care provider. Make sure you discuss any questions you have with your health care provider. Document Released: 11/13/2005 Document Revised: 02/11/2018 Document Reviewed: 09/18/2017 Elsevier Patient Education  2020 [employer].   Follow up with GI. Keep sugars controlled.

## 2019-11-06 DIAGNOSIS — K5909 Other constipation: Secondary | ICD-10-CM | POA: Diagnosis not present

## 2019-11-06 DIAGNOSIS — R1310 Dysphagia, unspecified: Secondary | ICD-10-CM | POA: Diagnosis not present

## 2019-11-06 DIAGNOSIS — R1114 Bilious vomiting: Secondary | ICD-10-CM | POA: Diagnosis not present

## 2019-11-06 DIAGNOSIS — K219 Gastro-esophageal reflux disease without esophagitis: Secondary | ICD-10-CM | POA: Diagnosis not present

## 2019-11-06 LAB — COMPLETE METABOLIC PANEL WITH GFR
AG Ratio: 1.4 (calc) (ref 1.0–2.5)
ALT: 13 U/L (ref 6–29)
AST: 15 U/L (ref 10–35)
Albumin: 3.9 g/dL (ref 3.6–5.1)
Alkaline phosphatase (APISO): 89 U/L (ref 37–153)
BUN: 17 mg/dL (ref 7–25)
CO2: 28 mmol/L (ref 20–32)
Calcium: 9.1 mg/dL (ref 8.6–10.4)
Chloride: 99 mmol/L (ref 98–110)
Creat: 0.78 mg/dL (ref 0.50–1.05)
GFR, Est African American: 100 mL/min/{1.73_m2} (ref >=60)
GFR, Est Non African American: 86 mL/min/{1.73_m2} (ref >=60)
Globulin: 2.7 g/dL (calc) (ref 1.9–3.7)
Glucose, Bld: 345 mg/dL — ABNORMAL HIGH (ref 65–99)
Potassium: 4.4 mmol/L (ref 3.5–5.3)
Sodium: 136 mmol/L (ref 135–146)
Total Bilirubin: 0.5 mg/dL (ref 0.2–1.2)
Total Protein: 6.6 g/dL (ref 6.1–8.1)

## 2019-11-06 LAB — CBC
HCT: 39.9 % (ref 35.0–45.0)
Hemoglobin: 12.8 g/dL (ref 11.7–15.5)
MCH: 29.6 pg (ref 27.0–33.0)
MCHC: 32.1 g/dL (ref 32.0–36.0)
MCV: 92.1 fL (ref 80.0–100.0)
MPV: 12.3 fL (ref 7.5–12.5)
Platelets: 246 10*3/uL (ref 140–400)
RBC: 4.33 10*6/uL (ref 3.80–5.10)
RDW: 11.9 % (ref 11.0–15.0)
WBC: 5.3 10*3/uL (ref 3.8–10.8)

## 2019-11-06 LAB — TSH: TSH: 1.19 mIU/L

## 2019-11-06 LAB — LIPASE: Lipase: <5 U/L — ABNORMAL LOW (ref 7–60)

## 2019-11-07 LAB — URINE CULTURE
MICRO NUMBER:: 1181212
Result:: NO GROWTH
SPECIMEN QUALITY:: ADEQUATE

## 2019-11-07 NOTE — Progress Notes (Signed)
Call pt:   Thyroid in normal range. No bacterial growth on urine. Elevated sugars and 345. Need close follow up with endocrinology to work on plan to get sugars decreased.

## 2019-11-10 ENCOUNTER — Encounter: Payer: Self-pay | Admitting: Physician Assistant

## 2019-11-10 DIAGNOSIS — R1319 Other dysphagia: Secondary | ICD-10-CM | POA: Insufficient documentation

## 2019-11-10 DIAGNOSIS — R112 Nausea with vomiting, unspecified: Secondary | ICD-10-CM | POA: Insufficient documentation

## 2019-11-10 DIAGNOSIS — R1310 Dysphagia, unspecified: Secondary | ICD-10-CM | POA: Insufficient documentation

## 2019-11-11 DIAGNOSIS — K219 Gastro-esophageal reflux disease without esophagitis: Secondary | ICD-10-CM | POA: Diagnosis not present

## 2019-11-11 DIAGNOSIS — R1310 Dysphagia, unspecified: Secondary | ICD-10-CM | POA: Diagnosis not present

## 2019-11-17 DIAGNOSIS — R1114 Bilious vomiting: Secondary | ICD-10-CM | POA: Diagnosis not present

## 2019-11-19 ENCOUNTER — Telehealth: Payer: Self-pay | Admitting: Endocrinology

## 2019-11-19 NOTE — Telephone Encounter (Signed)
Patient has called to advise that she has been diagnosed with gastroparesis and want to know if she should come in to see Dr Dwyane Dee or dietician earlier than January 2021  Please contact at [phone]

## 2019-11-20 NOTE — Telephone Encounter (Signed)
Please review schedule and let me know if he has a 30 minute availability. If not, will continue treatment plan as written by gastroenterologist per Dr. Ronnie Derby orders.

## 2019-11-20 NOTE — Telephone Encounter (Signed)
Please advise 

## 2019-11-20 NOTE — Telephone Encounter (Signed)
Only if we have an appointment available for 30 minutes.  Meanwhile she should continue to take the treatment from her gastroenterologist

## 2019-12-03 ENCOUNTER — Other Ambulatory Visit (INDEPENDENT_AMBULATORY_CARE_PROVIDER_SITE_OTHER): Payer: Medicare Other

## 2019-12-03 ENCOUNTER — Other Ambulatory Visit: Payer: Self-pay

## 2019-12-03 DIAGNOSIS — E1065 Type 1 diabetes mellitus with hyperglycemia: Secondary | ICD-10-CM

## 2019-12-03 LAB — COMPREHENSIVE METABOLIC PANEL
ALT: 45 U/L — ABNORMAL HIGH (ref 0–35)
AST: 30 U/L (ref 0–37)
Albumin: 4 g/dL (ref 3.5–5.2)
Alkaline Phosphatase: 102 U/L (ref 39–117)
BUN: 25 mg/dL — ABNORMAL HIGH (ref 6–23)
CO2: 33 mEq/L — ABNORMAL HIGH (ref 19–32)
Calcium: 9.4 mg/dL (ref 8.4–10.5)
Chloride: 101 mEq/L (ref 96–112)
Creatinine, Ser: 0.92 mg/dL (ref 0.40–1.20)
GFR: 63.37 mL/min (ref >60.00)
Glucose, Bld: 102 mg/dL — ABNORMAL HIGH (ref 70–99)
Potassium: 4.1 mEq/L (ref 3.5–5.1)
Sodium: 138 mEq/L (ref 135–145)
Total Bilirubin: 0.4 mg/dL (ref 0.2–1.2)
Total Protein: 7 g/dL (ref 6.0–8.3)

## 2019-12-03 LAB — MICROALBUMIN / CREATININE URINE RATIO
Creatinine,U: 103.7 mg/dL
Microalb Creat Ratio: 0.7 mg/g (ref 0.0–30.0)
Microalb, Ur: <0.7 mg/dL (ref 0.0–1.9)

## 2019-12-03 LAB — HEMOGLOBIN A1C: Hgb A1c MFr Bld: 7.8 % — ABNORMAL HIGH (ref 4.6–6.5)

## 2019-12-05 ENCOUNTER — Other Ambulatory Visit: Payer: Self-pay | Admitting: Endocrinology

## 2019-12-05 DIAGNOSIS — E1065 Type 1 diabetes mellitus with hyperglycemia: Secondary | ICD-10-CM

## 2019-12-05 DIAGNOSIS — E1143 Type 2 diabetes mellitus with diabetic autonomic (poly)neuropathy: Secondary | ICD-10-CM

## 2019-12-05 DIAGNOSIS — K3184 Gastroparesis: Secondary | ICD-10-CM

## 2019-12-08 ENCOUNTER — Other Ambulatory Visit: Payer: Self-pay

## 2019-12-10 ENCOUNTER — Ambulatory Visit: Payer: Medicare Other | Admitting: Endocrinology

## 2019-12-10 ENCOUNTER — Encounter: Payer: Medicare Other | Admitting: Nutrition

## 2019-12-11 ENCOUNTER — Encounter: Payer: Medicare Other | Admitting: Dietician

## 2019-12-16 DIAGNOSIS — M791 Myalgia, unspecified site: Secondary | ICD-10-CM | POA: Diagnosis not present

## 2019-12-16 DIAGNOSIS — M542 Cervicalgia: Secondary | ICD-10-CM | POA: Diagnosis not present

## 2019-12-16 DIAGNOSIS — M961 Postlaminectomy syndrome, not elsewhere classified: Secondary | ICD-10-CM | POA: Diagnosis not present

## 2019-12-16 DIAGNOSIS — M5030 Other cervical disc degeneration, unspecified cervical region: Secondary | ICD-10-CM | POA: Diagnosis not present

## 2019-12-22 ENCOUNTER — Encounter: Payer: Medicare Other | Admitting: Nutrition

## 2019-12-30 NOTE — Progress Notes (Signed)
Subjective:   Sheryl Matthews is a 54 y.o. female who presents for an Initial Medicare Annual Wellness Visit.  Review of Systems    No ROS.  Medicare Wellness Virtual Visit.  Visual/audio telehealth visit, UTA vital signs.   See social history for additional risk factors.     Cardiac Risk Factors include: diabetes mellitus;sedentary lifestyle Sleep patterns: Getting 8 hours of sleep a night- wears CPAP at night. Wakes up during night 1-2 times to void. Wakes up and feels slugish.   Home Safety/Smoke Alarms: Feels safe in home. Smoke alarms in place.  Living environment;Lives with husband in a 1 story home and basement has steps with hand rails in place. Shower is step over tub combo and grab bars in place. Seat Belt Safety/Bike Helmet: Wears seat belt.   Female:   Pap- Scheduled for 01/07/20      Mammo- UTD      Dexa scan-  Not eligible due to age      69- UTD    Objective:    Today's Vitals   01/06/20 1303  Temp: 98 F (36.7 C)  TempSrc: Oral  Weight: 141 lb (64 kg)  Height: 5\' 2"  (1.575 m)  PainSc: 8    Body mass index is 25.79 kg/m.  Advanced Directives 01/06/2020 12/24/2015  Does Patient Have a Medical Advance Directive? Yes No  Type of Advance Directive Living will;Healthcare Power of Attorney -  Does patient want to make changes to medical advance directive? No - Patient declined -  Copy of [place] in Chart? No - copy requested -  Would patient like information on creating a medical advance directive? - Yes - Educational materials given    Current Medications (verified) Outpatient Encounter Medications as of 01/06/2020  Medication Sig  . ALPRAZolam (XANAX) 0.5 MG tablet 1 tablet  . buPROPion (WELLBUTRIN XL) 300 MG 24 hr tablet Take 300 mg by mouth daily.  . Continuous Blood Gluc Sensor (DEXCOM G6 SENSOR) MISC 1 each by Does not apply route See admin instructions. Apply 1 sensor to body once every 10 days.  Marland Kitchen doxepin (SINEQUAN) 10 MG capsule  Take 10 mg by mouth at bedtime as needed.  . famotidine (PEPCID) 20 MG tablet Take 20 mg by mouth 2 (two) times daily.  . fluticasone (FLONASE) 50 MCG/ACT nasal spray USE 1 SPRAY IN EACH NOSTRIL DAILY  . gabapentin (NEURONTIN) 300 MG capsule TAKE 1 CAPSULE DAILY.  Marland Kitchen glucagon (GLUCAGON EMERGENCY) 1 MG injection INJECT 1 MG INTO THE MUSCLE FOR SEVERE HYPOGLYCEMIA  . glucose blood (FREESTYLE LITE) test strip USE AS INSTRUCTED TO CHECK BLOOD SUGAR SIX TIMES PER DAY. DX:E10.65  . ibuprofen (ADVIL,MOTRIN) 200 MG tablet Take 200 mg by mouth as needed.  . insulin aspart (NOVOLOG FLEXPEN) 100 UNIT/ML FlexPen Inject 7-8 Units into the skin 3 (three) times daily with meals. Inject 7-8 units under the skin three times daily before meals. (Patient taking differently: Inject 7-8 Units into the skin 3 (three) times daily with meals. Inject 4-5 units under the skin before breakfast and 7-8 units before lunch and dinner.)  . Insulin Glargine (LANTUS SOLOSTAR) 100 UNIT/ML Solostar Pen Inject into the skin daily. Inject 8 units under the skin in the morning and 12 units in the evening.  . Insulin Pen Needle (BD PEN NEEDLE NANO U/F) 32G X 4 MM MISC Use 7 per day to inject insulin  . KETOSTIX strip USE TO CHECK URINE KETONES IF BLOOD SUGAR IS OVER 300  AS DIRECTED BY PHYSICIAN  . lubiprostone (AMITIZA) 24 MCG capsule Take 24 mcg by mouth 2 (two) times daily with a meal.  . ondansetron (ZOFRAN) 4 MG tablet Take 4 mg by mouth every 8 (eight) hours as needed for nausea or vomiting.  . pantoprazole (PROTONIX) 40 MG tablet Take 40 mg by mouth daily.  Marland Kitchen FLUoxetine (PROZAC) 20 MG capsule Take 4 capsules (80 mg total) by mouth daily. (Patient taking differently: Take 80 mg by mouth daily. )  . [DISCONTINUED] LANTUS SOLOSTAR 100 UNIT/ML Solostar Pen INJECT 20 UNITS UNDER THE SKIN TWICE A DAY  . [DISCONTINUED] metoCLOPramide (REGLAN) 10 MG tablet Take 0.5 tablets (5 mg total) by mouth 3 (three) times daily before meals.  .  [DISCONTINUED] nystatin (MYCOSTATIN) 100000 UNIT/ML suspension Take 5 mLs (500,000 Units total) by mouth 4 (four) times daily. Swish for 30 seconds and spit out for 7-14 days.   No facility-administered encounter medications on file as of 01/06/2020.    Allergies (verified) Latex, Tape, Elavil [amitriptyline hcl], and Lyrica [pregabalin]   History: Past Medical History:  Diagnosis Date  . Bipolar 1 disorder ([place])   . Deafness in left ear   . Depression   . Diabetes mellitus without complication ([place])   . Gastroparesis   . Osteoarthritis   . Pain of left breast   . Skin disorder    Past Surgical History:  Procedure Laterality Date  . ANKLE FRACTURE SURGERY Left   . BREAST ENHANCEMENT SURGERY    . BREAST EXCISIONAL BIOPSY Left   . CERVICAL DISC SURGERY    . CESAREAN SECTION    . cosmetic surgery facial    . ELBOW SURGERY Right   . GALLBLADDER SURGERY    . INNER EAR SURGERY    . MINOR CARPAL TUNNEL Left    Family History  Problem Relation Age of Onset  . Depression Sister   . Alcohol abuse Brother   . Diabetes Brother   . Depression Brother   . Breast cancer Maternal Aunt    Social History   Socioeconomic History  . Marital status: Married    Spouse name: Louie Casa  . Number of children: 1  . Years of education: 98  . Highest education level: Some college, no degree  Occupational History  . Occupation: Homemaker  Tobacco Use  . Smoking status: Never Smoker  . Smokeless tobacco: Never Used  Substance and Sexual Activity  . Alcohol use: Not Currently    Alcohol/week: 0.0 standard drinks  . Drug use: No  . Sexual activity: Not Currently    Partners: Male    Birth control/protection: None  Other Topics Concern  . Not on file  Social History Narrative   Stays in bed a lot- has fibromyalgia   Social Determinants of Health   Financial Resource Strain:   . Difficulty of Paying Living Expenses: Not on file  Food Insecurity:   . Worried About Charity fundraiser in  the Last Year: Not on file  . Ran Out of Food in the Last Year: Not on file  Transportation Needs:   . Lack of Transportation (Medical): Not on file  . Lack of Transportation (Non-Medical): Not on file  Physical Activity:   . Days of Exercise per Week: Not on file  . Minutes of Exercise per Session: Not on file  Stress:   . Feeling of Stress : Not on file  Social Connections:   . Frequency of Communication with Friends and Family: Not on  file  . Frequency of Social Gatherings with Friends and Family: Not on file  . Attends Religious Services: Not on file  . Active Member of Clubs or Organizations: Not on file  . Attends Archivist Meetings: Not on file  . Marital Status: Not on file    Tobacco Counseling Counseling given: Not Answered   Clinical Intake:  Pre-visit preparation completed: Yes  Pain : 0-10 Pain Score: 8  Pain Type: Chronic pain Pain Location: Shoulder Pain Descriptors / Indicators: Constant, Aching, Throbbing Pain Onset: More than a month ago Pain Frequency: Intermittent Pain Relieving Factors: ibuprofen  Pain Relieving Factors: ibuprofen  Nutritional Risks: None Diabetes: Yes CBG done?: No(180 at home this am) Did pt. bring in CBG monitor from home?: No  How often do you need to have someone help you when you read instructions, pamphlets, or other written materials from your doctor or pharmacy?: 1 - Never What is the last grade level you completed in school?: 13  Interpreter Needed?: No  Information entered by :: Orlie Dakin, LPN   Activities of Daily Living In your present state of health, do you have any difficulty performing the following activities: 01/06/2020  Hearing? Y  Comment hearing loss- hole in eardrum on the left  Vision? N  Difficulty concentrating or making decisions? Y  Walking or climbing stairs? Y  Comment due to fibromyalgia  Dressing or bathing? N  Doing errands, shopping? N  Preparing Food and eating ? N  Using  the Toilet? N  In the past six months, have you accidently leaked urine? Y  Comment wears a pad  Do you have problems with loss of bowel control? Y  Comment constipation  Managing your Medications? N  Managing your Finances? N  Housekeeping or managing your Housekeeping? N  Some recent data might be hidden     Immunizations and Health Maintenance Immunization History  Administered Date(s) Administered  . Influenza, Seasonal, Injecte, Preservative Fre 09/20/2015, 08/15/2016  . Influenza,inj,Quad PF,6+ Mos 08/31/2017, 07/23/2018, 08/11/2019  . Pneumococcal Polysaccharide-23 05/02/2019  . Tdap 10/31/2018   Health Maintenance Due  Topic Date Due  . Hepatitis C Screening  [birth year]  . HIV Screening  11/26/1979  . PAP SMEAR-Modifier  12/17/2018    Patient Care Team: Lavada Mesi as PCP - General (Family Medicine)  Indicate any recent Medical Services you may have received from other than [facility] providers in the past year (date may be approximate).     Assessment:   This is a routine wellness examination for Claud[place].Physical assessment deferred to PCP.   Hearing/Vision screen No exam data present  Dietary issues and exercise activities discussed: Current Exercise Habits: The patient does not participate in regular exercise at present, Exercise limited by: None identified Diet Eats a fairly healthy diet but gastroparesis has changed diet sees nutritionist for this. Breakfast:Cream of wheat and banana Lunch: applesauce and egg Dinner: applesauce, smoothie       Goals    . Exercise 3x per week (30 min per time)     Would like to go back to exercising again once gets stomach straight.       Depression Screen PHQ 2/9 Scores 01/06/2020 08/11/2019 07/18/2019 12/24/2018 08/31/2017 12/24/2015  PHQ - 2 Score 2 1 5 3 1 3   PHQ- 9 Score 10 3 23 12  - -  Exception Documentation Medical reason - - - - -  Some encounter information is confidential and restricted. Go to Review  Flowsheets activity to  see all data.    Fall Risk Fall Risk  01/06/2020 12/24/2015  Falls in the past year? 1 No  Number falls in past yr: 1 -  Injury with Fall? 0 -  Risk for fall due to : Medication side effect;Other (Comment) -  Risk for fall due to: Comment has dizzy spells occasionally -  Follow up Falls prevention discussed -    Is the patient's home free of loose throw rugs in walkways, pet beds, electrical cords, etc?   yes      Grab bars in the bathroom? yes      Handrails on the stairs?   yes      Adequate lighting?   yes   Cognitive Function:     6CIT Screen 01/06/2020  What Year? 0 points  What month? 0 points  What time? 0 points  Count back from 20 2 points  Months in reverse 4 points  Repeat phrase 2 points  Total Score 8    Screening Tests Health Maintenance  Topic Date Due  . Hepatitis C Screening  [birth year]  . HIV Screening  11/26/1979  . PAP SMEAR-Modifier  12/17/2018  . MAMMOGRAM  01/10/2020  . FOOT EXAM  05/01/2020  . HEMOGLOBIN A1C  06/01/2020  . OPHTHALMOLOGY EXAM  06/04/2020  . URINE MICROALBUMIN  12/02/2020  . COLONOSCOPY  10/18/2026  . TETANUS/TDAP  10/31/2028  . INFLUENZA VACCINE  Completed  . PNEUMOCOCCAL POLYSACCHARIDE VACCINE AGE 57-64 HIGH RISK  Completed     Plan:    Please schedule your next medicare wellness visit with me in 1 yr.  Ms. Vanmarter , Thank you for taking time to come for your Medicare Wellness Visit. I appreciate your ongoing commitment to your health goals. Please review the following plan we discussed and let me know if I can assist you in the future.  Hepatitis C panel ordered and will go to lab and have drawn.  These are the goals we discussed: Goals    . Exercise 3x per week (30 min per time)     Would like to go back to exercising again once gets stomach straight.        This is a list of the screening recommended for you and due dates:  Health Maintenance  Topic Date Due  .  Hepatitis C: One time screening  is recommended by Center for Disease Control  (CDC) for  adults born from [birth year] through 1967.   May 08, 1966  . HIV Screening  11/26/1979  . Pap Smear  12/17/2018  . Mammogram  01/10/2020  . Complete foot exam   05/01/2020  . Hemoglobin A1C  06/01/2020  . Eye exam for diabetics  06/04/2020  . Urine Protein Check  12/02/2020  . Colon Cancer Screening  10/18/2026  . Tetanus Vaccine  10/31/2028  . Flu Shot  Completed  . Pneumococcal vaccine  Completed     I have personally reviewed and noted the following in the patient's chart:   . Medical and social history . Use of alcohol, tobacco or illicit drugs  . Current medications and supplements . Functional ability and status . Nutritional status . Physical activity . Advanced directives . List of other physicians . Hospitalizations, surgeries, and ER visits in previous 12 months . Vitals . Screenings to include cognitive, depression, and falls . Referrals and appointments  In addition, I have reviewed and discussed with patient certain preventive protocols, quality metrics, and best practice recommendations. A written personalized care plan for  preventive services as well as general preventive health recommendations were provided to patient.     Joanne Chars, LPN   [phone]

## 2020-01-02 DIAGNOSIS — M542 Cervicalgia: Secondary | ICD-10-CM | POA: Diagnosis not present

## 2020-01-02 DIAGNOSIS — M5412 Radiculopathy, cervical region: Secondary | ICD-10-CM | POA: Diagnosis not present

## 2020-01-02 DIAGNOSIS — R200 Anesthesia of skin: Secondary | ICD-10-CM | POA: Diagnosis not present

## 2020-01-05 ENCOUNTER — Encounter: Payer: Self-pay | Admitting: Endocrinology

## 2020-01-05 ENCOUNTER — Other Ambulatory Visit: Payer: Self-pay

## 2020-01-05 ENCOUNTER — Ambulatory Visit (INDEPENDENT_AMBULATORY_CARE_PROVIDER_SITE_OTHER): Payer: Medicare Other | Admitting: Endocrinology

## 2020-01-05 VITALS — BP 120/66 | HR 96 | Ht 62.0 in | Wt 141.8 lb

## 2020-01-05 DIAGNOSIS — K3184 Gastroparesis: Secondary | ICD-10-CM

## 2020-01-05 DIAGNOSIS — E1065 Type 1 diabetes mellitus with hyperglycemia: Secondary | ICD-10-CM | POA: Diagnosis not present

## 2020-01-05 DIAGNOSIS — E1143 Type 2 diabetes mellitus with diabetic autonomic (poly)neuropathy: Secondary | ICD-10-CM

## 2020-01-05 DIAGNOSIS — E1043 Type 1 diabetes mellitus with diabetic autonomic (poly)neuropathy: Secondary | ICD-10-CM | POA: Diagnosis not present

## 2020-01-05 NOTE — Progress Notes (Signed)
Patient ID: Sheryl Matthews, female   DOB: May 16, 1966, 54 y.o.   MRN: [id number]          Reason for Appointment : Follow-up for Type 1 Diabetes  History of Present Illness          Diagnosis: Type 1 diabetes mellitus, date of diagnosis:  at age 22    Previous history:   She has been on insulin since the time of diagnosis. She was on the same program of [facility] and Regular Insulin since diagnosis was made when she was first seen here  Recent history:   INSULIN regimen is described as:  Lantus 8 units a.m.-- 12 units at 9 p.m., Novolog 5-8 units,  1: 10 carbohydrate ratio, using. 1:50 correction.  Her A1c again the same at 7.8   Glucose patterns, management and problems identified:    She has had symptoms of gastroparesis being managed by gastroenterologist  However she appears to be still able to eat breakfast and her vomiting is only occasional  Blood sugar patterns still indicate that her blood sugars at breakfast and some of lunch and dinner still go up fairly quickly  Overall her blood sugar patterns are similar to her previous visit  She said that her blood sugars are higher late at night because of binge eating in the evening  She is confused about how to take her insulin for snacks and not clear how she is covering her meals especially in the evenings  She had made an appointment with the dietitian but she canceled it because she was feeling sick that day  Hypoglycemia is less in the last week or so but was getting more low sugars in the afternoons in the previous week  She still does not think she can manage doing an insulin pump  Significant variability in her blood sugars especially overnight and late afternoons  Also her blood sugars were showing marked increases in the first week of her CGM recording compared to the second week  She did get dexamethasone from her PCP in August for headaches  Again most of her high sugars are related to eating larger portions are  snacks without coverage and sometimes treating low normal or low sugars excessively  LANTUS was increased to 12 units on the last visit with some improvement in her overnight blood sugars  She is still not comfortable wanting to try and use her pump  HIGHEST blood sugars are overnight mostly after 11 PM until about 4 AM with an average of 219 between 3-4 AM      CONTINUOUS GLUCOSE MONITORING RECORD INTERPRETATION    Dates of Recording: 1/26 through 2/8  Sensor description: G6  Results statistics:   CGM use % of time  99  Average and SD   Time in range      56%  % Time Above 180  38  % Time above 250  12  % Time Below target  7     Glycemic patterns summary:   Hyperglycemic episodes variably after meals, more in the last week after breakfast and lunch actually this week more after dinner Also has significant excursions above 180 frequently around midnight until at least 4 AM  Hypoglycemic episodes occurred mostly in 1 week #1 between 12-1 PM and around 4 PM and minimal in the last week  Overnight periods: Blood sugars are generally starting to rise around midnight and may be occasionally persistently high In the last week blood sugars usually are trending downward  after about 4 AM  Preprandial periods: Blood sugars are on an average high at breakfast with average over 200 but averaging as low as 104 before lunch Before dinner blood sugars are averaging about 156 with some variability  Postprandial periods:   After breakfast: Blood sugars at least in the last week are usually going up over 180 fairly rapidly on most occasions    After lunch: Blood sugar usually flat after meals in the first week and variably high in the last week After dinner: Blood sugars were rising significantly to as high as 400 in week 1 but mostly within target in the last week with average blood sugar about 188    Previous data:   CGM use % of time  99  Average and SD  183+/-82  Time in range      50   %  % Time Above 180  46  % Time above 250  20  % Time Below target  4    Wt Readings from Last 3 Encounters:  01/05/20 141 lb 12.8 oz (64.3 kg)  11/05/19 134 lb (60.8 kg)  08/11/19 142 lb (64.4 kg)     Diabetes labs:  Lab Results  Component Value Date   HGBA1C 7.8 (H) 12/03/2019   HGBA1C 7.8 (H) 09/02/2019   HGBA1C 7.9 (H) 04/28/2019   Lab Results  Component Value Date   MICROALBUR <0.7 12/03/2019   LDLCALC 119 (H) 04/28/2019   CREATININE 0.92 12/03/2019    Lab Results  Component Value Date   FRUCTOSAMINE 347 (H) 07/12/2018   FRUCTOSAMINE 340 (H) 06/11/2017     Allergies as of 01/05/2020      Reactions   Latex Rash   Tape Rash   Elavil [amitriptyline Hcl]    Groggy/constipation/did not think worked    Lyrica [pregabalin]    Did not have benefit.  Falling asleep. Feeling depressed.      Medication List       Accurate as of January 05, 2020  3:25 PM. If you have any questions, ask your nurse or doctor.        ALPRAZolam 0.5 MG tablet Commonly known as: XANAX 1 tablet   buPROPion 300 MG 24 hr tablet Commonly known as: WELLBUTRIN XL Take 300 mg by mouth daily.   Dexcom G6 Sensor Misc 1 each by Does not apply route See admin instructions. Apply 1 sensor to body once every 10 days.   FLUoxetine 20 MG capsule Commonly known as: PROZAC Take 4 capsules (80 mg total) by mouth daily.   fluticasone 50 MCG/ACT nasal spray Commonly known as: FLONASE USE 1 SPRAY IN EACH NOSTRIL DAILY   FREESTYLE LITE test strip Generic drug: glucose blood USE AS INSTRUCTED TO CHECK BLOOD SUGAR SIX TIMES PER DAY. DX:E10.65   gabapentin 300 MG capsule Commonly known as: NEURONTIN TAKE 1 CAPSULE DAILY.   Glucagon Emergency 1 MG Kit INJECT 1 MG INTO THE MUSCLE FOR SEVERE HYPOGLYCEMIA   ibuprofen 200 MG tablet Commonly known as: ADVIL Take 200 mg by mouth as needed.   insulin aspart 100 UNIT/ML FlexPen Commonly known as: NovoLOG FlexPen Inject 7-8 Units into  the skin 3 (three) times daily with meals. Inject 7-8 units under the skin three times daily before meals. What changed: additional instructions   Insulin Pen Needle 32G X 4 MM Misc Commonly known as: BD Pen Needle Nano U/F Use 7 per day to inject insulin   Ketostix strip Generic drug: acetone (urine) test USE  TO CHECK URINE KETONES IF BLOOD SUGAR IS OVER 300 AS DIRECTED BY PHYSICIAN   Lantus SoloStar 100 UNIT/ML Solostar Pen Generic drug: Insulin Glargine Inject into the skin daily. Inject 8 units under the skin in the morning and 12 units in the evening. What changed: Another medication with the same name was removed. Continue taking this medication, and follow the directions you see here. Changed by: Elayne Snare, MD   metoCLOPramide 10 MG tablet Commonly known as: REGLAN Take 0.5 tablets (5 mg total) by mouth 3 (three) times daily before meals.   nystatin 100000 UNIT/ML suspension Commonly known as: MYCOSTATIN Take 5 mLs (500,000 Units total) by mouth 4 (four) times daily. Swish for 30 seconds and spit out for 7-14 days.       Allergies:  Allergies  Allergen Reactions  . Latex Rash  . Tape Rash  . Elavil [Amitriptyline Hcl]     Groggy/constipation/did not think worked   . Lyrica [Pregabalin]     Did not have benefit.  Falling asleep. Feeling depressed.    Past Medical History:  Diagnosis Date  . Bipolar 1 disorder ([place])   . Deafness in left ear   . Depression   . Diabetes mellitus without complication ([place])   . Osteoarthritis   . Pain of left breast   . Skin disorder     Past Surgical History:  Procedure Laterality Date  . ANKLE FRACTURE SURGERY Left   . BREAST ENHANCEMENT SURGERY    . BREAST EXCISIONAL BIOPSY Left   . CERVICAL DISC SURGERY    . CESAREAN SECTION    . cosmetic surgery facial    . ELBOW SURGERY Right   . GALLBLADDER SURGERY    . INNER EAR SURGERY    . MINOR CARPAL TUNNEL Left     Family History  Problem Relation Age of Onset  .  Depression Sister   . Alcohol abuse Brother   . Diabetes Brother   . Depression Brother   . Breast cancer Maternal Aunt     Social History:  reports that she has never smoked. She has never used smokeless tobacco. She reports current alcohol use. She reports that she does not use drugs.     ROS     Lipids: LDL, Not on any statins, LDL is better in 10/20 with cutting back on junk food  Lab Results  Component Value Date   CHOL 217 (H) 04/28/2019   HDL 83.90 04/28/2019   LDLCALC 119 (H) 04/28/2019   LDLDIRECT 85.0 09/02/2019   TRIG 70.0 04/28/2019   CHOLHDL 3 04/28/2019    Depression: She is taking Wellbutrin and Prozac      She has a history of Numbness and, tingling in her feet   Gastroparesis: She has nausea, vomiting but does not appear that food intake has been reduced or any weight loss. She thinks that metoclopramide makes her nauseated and possibly drowsy, she thinks she is on Sinequan but did not see it on her records and she is due to see gastroenterologist next month   Physical Examination:  BP 120/66 (BP Location: Left Arm, Patient Position: Sitting, Cuff Size: Normal)   Pulse 96   Ht _0  (1.575 m)   Wt 141 lb 12.8 oz (64.3 kg)   LMP 01/09/2018   SpO2 98%   BMI 25.94 kg/m     ASSESSMENT:  Diabetes type 1, long-standing and persistently poorly controlled Diabetic gastroparesis, recently diagnosed  See history of present illness for detailed discussion  of current diabetes management, blood sugar patterns and problems identified  Her A1c is 7.8 %  She still has difficulty balancing her insulin and diet She is getting higher readings after breakfast frequently and likely needs more than 1: 10 carbohydrate ratio in the morning since she is eating more of a high carbohydrate meal and usually appears to be absorbing this Blood sugars are relatively lower in the afternoons and the lowest around 4-5 PM overall However hypoglycemia more frequent late at  night which is frequently related to binge eating for which she has difficulty knowing when to take her boluses  Her day-to-day management of insulin, blood sugar patterns, diet and treatment of high and low sugars was discussed in detail  Again she does not think she is capable of learning how to use the insulin pump even though she has a [employer]tr[employer] pump at home  Gastroparesis: She may be a candidate for erythromycin but she is waiting to see the gastroenterologist again next month, reportedly intolerant to Reglan  PLAN:     Reduce Lantus to 6 units in the morning and increase it to at least 14 in the evening, this should help her overnight hyperglycemia  She can try and exercise in the mornings after breakfast and reduce her insulin 50%  However for her usual meal of banana, grapes and peanut butter powder she can take 6 to 7 units instead of 5  She can try to get use only small amounts of NovoLog coverage for her late evening snacks depending on her carbohydrate intake, currently likely not taking any  As before try to use only glucose tablets or gel for hypoglycemia instead of eating more food   She will call to see the dietitian and reschedule her appointment for meal planning to help her with adjusting to a gastroparesis diet  Follow-up in 3 months  There are no Patient Instructions on file for this visit.      Elayne Snare 01/05/2020, 3:25 PM   Note: This note was prepared with Dragon voice recognition system technology. Any transcriptional errors that result from this process are unintentional.    Elayne Snare

## 2020-01-05 NOTE — Patient Instructions (Signed)
Lantus 6 units in am and 14 in pm  Take 6-7 units at starting breakfast for 60-65g carbs

## 2020-01-06 ENCOUNTER — Ambulatory Visit (INDEPENDENT_AMBULATORY_CARE_PROVIDER_SITE_OTHER): Payer: Medicare Other | Admitting: *Deleted

## 2020-01-06 VITALS — Temp 98.0°F | Ht 62.0 in | Wt 141.0 lb

## 2020-01-06 DIAGNOSIS — Z1159 Encounter for screening for other viral diseases: Secondary | ICD-10-CM

## 2020-01-06 DIAGNOSIS — Z0000 Encounter for general adult medical examination without abnormal findings: Secondary | ICD-10-CM

## 2020-01-06 NOTE — Patient Instructions (Signed)
Please schedule your next medicare wellness visit with me in 1 yr.  Sheryl Matthews , Thank you for taking time to come for your Medicare Wellness Visit. I appreciate your ongoing commitment to your health goals. Please review the following plan we discussed and let me know if I can assist you in the future.  Hepatitis C panel ordered and will go to lab and have drawn. These are the goals we discussed: Goals    . Exercise 3x per week (30 min per time)     Would like to go back to exercising again once gets stomach straight.

## 2020-01-07 ENCOUNTER — Telehealth: Payer: Self-pay

## 2020-01-07 DIAGNOSIS — R300 Dysuria: Secondary | ICD-10-CM | POA: Diagnosis not present

## 2020-01-07 DIAGNOSIS — N952 Postmenopausal atrophic vaginitis: Secondary | ICD-10-CM | POA: Diagnosis not present

## 2020-01-07 DIAGNOSIS — Z01411 Encounter for gynecological examination (general) (routine) with abnormal findings: Secondary | ICD-10-CM | POA: Diagnosis not present

## 2020-01-07 NOTE — Telephone Encounter (Signed)
This prescription is not needed

## 2020-01-07 NOTE — Telephone Encounter (Signed)
MEDICATION: kito sticks  PHARMACY[facility]   IS THIS A 90 DAY SUPPLY : yes  IS PATIENT OUT OF MEDICATION:   IF NOT; HOW MUCH IS LEFT:   LAST APPOINTMENT DATE: @2 /06/2020  NEXT APPOINTMENT DATE:@Visit  date not found  DO WE HAVE YOUR PERMISSION TO LEAVE A DETAILED MESSAGE:  OTHER COMMENTS: this would be a new RX   **Let patient know to contact pharmacy at the end of the day to make sure medication is ready. **  ** Please notify patient to allow 48-72 hours to process**  **Encourage patient to contact the pharmacy for refills or they can request refills through [facility]**

## 2020-01-07 NOTE — Telephone Encounter (Signed)
Ok to send

## 2020-01-08 NOTE — Telephone Encounter (Signed)
Called pt and notified her of this.

## 2020-01-12 DIAGNOSIS — E1143 Type 2 diabetes mellitus with diabetic autonomic (poly)neuropathy: Secondary | ICD-10-CM | POA: Diagnosis not present

## 2020-01-12 DIAGNOSIS — K3184 Gastroparesis: Secondary | ICD-10-CM | POA: Diagnosis not present

## 2020-01-13 DIAGNOSIS — R202 Paresthesia of skin: Secondary | ICD-10-CM | POA: Diagnosis not present

## 2020-01-13 DIAGNOSIS — M542 Cervicalgia: Secondary | ICD-10-CM | POA: Diagnosis not present

## 2020-01-28 DIAGNOSIS — K219 Gastro-esophageal reflux disease without esophagitis: Secondary | ICD-10-CM | POA: Diagnosis not present

## 2020-01-28 DIAGNOSIS — F419 Anxiety disorder, unspecified: Secondary | ICD-10-CM | POA: Diagnosis not present

## 2020-01-28 DIAGNOSIS — Z888 Allergy status to other drugs, medicaments and biological substances status: Secondary | ICD-10-CM | POA: Diagnosis not present

## 2020-01-28 DIAGNOSIS — Z79899 Other long term (current) drug therapy: Secondary | ICD-10-CM | POA: Diagnosis not present

## 2020-01-28 DIAGNOSIS — F329 Major depressive disorder, single episode, unspecified: Secondary | ICD-10-CM | POA: Diagnosis not present

## 2020-01-28 DIAGNOSIS — M797 Fibromyalgia: Secondary | ICD-10-CM | POA: Diagnosis not present

## 2020-01-28 DIAGNOSIS — E119 Type 2 diabetes mellitus without complications: Secondary | ICD-10-CM | POA: Diagnosis not present

## 2020-01-28 DIAGNOSIS — Z794 Long term (current) use of insulin: Secondary | ICD-10-CM | POA: Diagnosis not present

## 2020-01-28 DIAGNOSIS — H9202 Otalgia, left ear: Secondary | ICD-10-CM | POA: Diagnosis not present

## 2020-01-28 DIAGNOSIS — G4733 Obstructive sleep apnea (adult) (pediatric): Secondary | ICD-10-CM | POA: Diagnosis not present

## 2020-01-28 DIAGNOSIS — Z91048 Other nonmedicinal substance allergy status: Secondary | ICD-10-CM | POA: Diagnosis not present

## 2020-01-28 DIAGNOSIS — Z91040 Latex allergy status: Secondary | ICD-10-CM | POA: Diagnosis not present

## 2020-01-28 DIAGNOSIS — J029 Acute pharyngitis, unspecified: Secondary | ICD-10-CM | POA: Diagnosis not present

## 2020-01-28 DIAGNOSIS — R070 Pain in throat: Secondary | ICD-10-CM | POA: Diagnosis not present

## 2020-01-28 DIAGNOSIS — R221 Localized swelling, mass and lump, neck: Secondary | ICD-10-CM | POA: Diagnosis not present

## 2020-01-29 DIAGNOSIS — Z981 Arthrodesis status: Secondary | ICD-10-CM | POA: Diagnosis not present

## 2020-01-29 DIAGNOSIS — M542 Cervicalgia: Secondary | ICD-10-CM | POA: Diagnosis not present

## 2020-01-29 DIAGNOSIS — M5412 Radiculopathy, cervical region: Secondary | ICD-10-CM | POA: Diagnosis not present

## 2020-02-05 ENCOUNTER — Telehealth: Payer: Self-pay | Admitting: Neurology

## 2020-02-05 NOTE — Telephone Encounter (Signed)
Patient left vm asking if she should see another provider to manage arthritis pain or whether this could be managed by [facility]. Please advise.

## 2020-02-06 ENCOUNTER — Encounter: Payer: TRICARE For Life (TFL) | Admitting: Dietician

## 2020-02-06 NOTE — Telephone Encounter (Signed)
I would suggest starting the Dr. Darene Lamer in office sports medicine.

## 2020-02-06 NOTE — Telephone Encounter (Signed)
Patient made aware. Transferred to front desk to schedule appt.

## 2020-02-09 DIAGNOSIS — M25572 Pain in left ankle and joints of left foot: Secondary | ICD-10-CM | POA: Diagnosis not present

## 2020-02-09 DIAGNOSIS — M79672 Pain in left foot: Secondary | ICD-10-CM | POA: Diagnosis not present

## 2020-02-17 ENCOUNTER — Encounter: Payer: Self-pay | Admitting: Dietician

## 2020-02-17 ENCOUNTER — Encounter: Payer: Medicare Other | Attending: Endocrinology | Admitting: Dietician

## 2020-02-17 ENCOUNTER — Other Ambulatory Visit: Payer: Self-pay

## 2020-02-17 DIAGNOSIS — E1065 Type 1 diabetes mellitus with hyperglycemia: Secondary | ICD-10-CM | POA: Diagnosis not present
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
## 2020-02-17 NOTE — Patient Instructions (Addendum)
Consider getting your vitamin D level checked again.  You were low in the past. Consider taking 2000 units of vitamin D 3 daily.  Consider making a counseling appointment  Resource:  Living Well!  With Gastroparesis by [employer], [facility]  Instead of stress eating, consider using essential oils and the neck massager or stretching, or walk the dog. Consider not bringing in the items that you binge on into the house.  Plan exercise for after a meal and you may need to reduce the amount of insulin that you take with the meal.  Monitor your blood sugar throughout and be sure to have carbohydrate options that you can tolerate with you to drink throughout the exercise. Consider starting slower with exercise and do this for a shorter time period.  Plan the majority of your carbohydrates to be with meals and less with snacks. Have a protein choice (peanut butter, peanut butter powder, fish, greek yogurt, cottage cheese, egg) with each meal and each snack.

## 2020-02-18 DIAGNOSIS — M797 Fibromyalgia: Secondary | ICD-10-CM | POA: Diagnosis not present

## 2020-02-19 DIAGNOSIS — M797 Fibromyalgia: Secondary | ICD-10-CM | POA: Diagnosis not present

## 2020-02-20 ENCOUNTER — Other Ambulatory Visit: Payer: Self-pay | Admitting: Endocrinology

## 2020-03-09 DIAGNOSIS — M79642 Pain in left hand: Secondary | ICD-10-CM | POA: Diagnosis not present

## 2020-03-09 DIAGNOSIS — M79644 Pain in right finger(s): Secondary | ICD-10-CM | POA: Diagnosis not present

## 2020-03-19 DIAGNOSIS — K3184 Gastroparesis: Secondary | ICD-10-CM | POA: Diagnosis not present

## 2020-03-19 DIAGNOSIS — K5909 Other constipation: Secondary | ICD-10-CM | POA: Diagnosis not present

## 2020-03-22 DIAGNOSIS — F319 Bipolar disorder, unspecified: Secondary | ICD-10-CM | POA: Diagnosis not present

## 2020-03-26 DIAGNOSIS — M6530 Trigger finger, unspecified finger: Secondary | ICD-10-CM | POA: Diagnosis not present

## 2020-03-26 DIAGNOSIS — Z20822 Contact with and (suspected) exposure to covid-19: Secondary | ICD-10-CM | POA: Diagnosis not present

## 2020-03-26 DIAGNOSIS — Z01812 Encounter for preprocedural laboratory examination: Secondary | ICD-10-CM | POA: Diagnosis not present

## 2020-03-29 DIAGNOSIS — E119 Type 2 diabetes mellitus without complications: Secondary | ICD-10-CM | POA: Diagnosis not present

## 2020-03-29 DIAGNOSIS — G4730 Sleep apnea, unspecified: Secondary | ICD-10-CM | POA: Diagnosis not present

## 2020-03-29 DIAGNOSIS — E1143 Type 2 diabetes mellitus with diabetic autonomic (poly)neuropathy: Secondary | ICD-10-CM | POA: Diagnosis not present

## 2020-03-29 DIAGNOSIS — K3184 Gastroparesis: Secondary | ICD-10-CM | POA: Diagnosis not present

## 2020-03-29 DIAGNOSIS — Z79899 Other long term (current) drug therapy: Secondary | ICD-10-CM | POA: Diagnosis not present

## 2020-03-29 DIAGNOSIS — M5412 Radiculopathy, cervical region: Secondary | ICD-10-CM | POA: Diagnosis not present

## 2020-03-29 DIAGNOSIS — K219 Gastro-esophageal reflux disease without esophagitis: Secondary | ICD-10-CM | POA: Diagnosis not present

## 2020-03-29 DIAGNOSIS — M1990 Unspecified osteoarthritis, unspecified site: Secondary | ICD-10-CM | POA: Diagnosis not present

## 2020-03-29 DIAGNOSIS — F319 Bipolar disorder, unspecified: Secondary | ICD-10-CM | POA: Diagnosis not present

## 2020-03-29 DIAGNOSIS — Z79890 Hormone replacement therapy (postmenopausal): Secondary | ICD-10-CM | POA: Diagnosis not present

## 2020-03-29 DIAGNOSIS — M65331 Trigger finger, right middle finger: Secondary | ICD-10-CM | POA: Diagnosis not present

## 2020-03-29 DIAGNOSIS — Z833 Family history of diabetes mellitus: Secondary | ICD-10-CM | POA: Diagnosis not present

## 2020-03-29 DIAGNOSIS — M720 Palmar fascial fibromatosis [Dupuytren]: Secondary | ICD-10-CM | POA: Diagnosis not present

## 2020-03-29 DIAGNOSIS — Z91040 Latex allergy status: Secondary | ICD-10-CM | POA: Diagnosis not present

## 2020-04-01 ENCOUNTER — Other Ambulatory Visit: Payer: Medicare Other

## 2020-04-01 DIAGNOSIS — M150 Primary generalized (osteo)arthritis: Secondary | ICD-10-CM | POA: Diagnosis not present

## 2020-04-01 DIAGNOSIS — Z791 Long term (current) use of non-steroidal anti-inflammatories (NSAID): Secondary | ICD-10-CM | POA: Diagnosis not present

## 2020-04-01 DIAGNOSIS — M797 Fibromyalgia: Secondary | ICD-10-CM | POA: Diagnosis not present

## 2020-04-05 ENCOUNTER — Ambulatory Visit: Payer: Medicare Other | Admitting: Endocrinology

## 2020-04-05 DIAGNOSIS — M47812 Spondylosis without myelopathy or radiculopathy, cervical region: Secondary | ICD-10-CM | POA: Diagnosis not present

## 2020-04-05 DIAGNOSIS — G5603 Carpal tunnel syndrome, bilateral upper limbs: Secondary | ICD-10-CM | POA: Diagnosis not present

## 2020-04-05 DIAGNOSIS — Z981 Arthrodesis status: Secondary | ICD-10-CM | POA: Diagnosis not present

## 2020-04-06 ENCOUNTER — Ambulatory Visit: Payer: Medicare Other | Admitting: Endocrinology

## 2020-04-08 ENCOUNTER — Other Ambulatory Visit: Payer: Medicare Other

## 2020-04-12 ENCOUNTER — Ambulatory Visit: Payer: Medicare Other | Admitting: Endocrinology

## 2020-04-16 ENCOUNTER — Other Ambulatory Visit: Payer: Medicare Other

## 2020-04-20 ENCOUNTER — Telehealth: Payer: Medicare Other | Admitting: Endocrinology

## 2020-04-21 DIAGNOSIS — F319 Bipolar disorder, unspecified: Secondary | ICD-10-CM | POA: Diagnosis not present

## 2020-05-04 ENCOUNTER — Telehealth: Payer: Self-pay

## 2020-05-04 NOTE — Telephone Encounter (Signed)
FAXED [place]: [employer] (faxed on 05/03/20) Document: Standard written order for CGM supplies Other records requested: chart notes.  All above requested information has been faxed successfully to [employer] listed above. Documents and fax confirmation have been placed in the faxed file for future reference.

## 2020-05-24 ENCOUNTER — Other Ambulatory Visit: Payer: Self-pay | Admitting: Endocrinology

## 2020-06-09 DIAGNOSIS — N6320 Unspecified lump in the left breast, unspecified quadrant: Secondary | ICD-10-CM | POA: Diagnosis not present

## 2020-06-09 DIAGNOSIS — N644 Mastodynia: Secondary | ICD-10-CM | POA: Diagnosis not present

## 2020-06-25 ENCOUNTER — Other Ambulatory Visit (INDEPENDENT_AMBULATORY_CARE_PROVIDER_SITE_OTHER): Payer: Medicare Other

## 2020-06-25 ENCOUNTER — Other Ambulatory Visit: Payer: Self-pay

## 2020-06-25 DIAGNOSIS — E1065 Type 1 diabetes mellitus with hyperglycemia: Secondary | ICD-10-CM

## 2020-06-25 LAB — COMPREHENSIVE METABOLIC PANEL
ALT: 24 U/L (ref 0–35)
AST: 21 U/L (ref 0–37)
Albumin: 3.9 g/dL (ref 3.5–5.2)
Alkaline Phosphatase: 103 U/L (ref 39–117)
BUN: 19 mg/dL (ref 6–23)
CO2: 32 mEq/L (ref 19–32)
Calcium: 9.2 mg/dL (ref 8.4–10.5)
Chloride: 102 mEq/L (ref 96–112)
Creatinine, Ser: 0.79 mg/dL (ref 0.40–1.20)
GFR: 75.4 mL/min (ref >60.00)
Glucose, Bld: 208 mg/dL — ABNORMAL HIGH (ref 70–99)
Potassium: 4.1 mEq/L (ref 3.5–5.1)
Sodium: 137 mEq/L (ref 135–145)
Total Bilirubin: 0.4 mg/dL (ref 0.2–1.2)
Total Protein: 6.8 g/dL (ref 6.0–8.3)

## 2020-06-25 LAB — HEMOGLOBIN A1C: Hgb A1c MFr Bld: 8 % — ABNORMAL HIGH (ref 4.6–6.5)

## 2020-06-25 MED ORDER — NOVOLOG FLEXPEN 100 UNIT/ML ~~LOC~~ SOPN: PEN_INJECTOR | SUBCUTANEOUS | 1 refills | Status: DC

## 2020-06-28 ENCOUNTER — Encounter: Payer: Self-pay | Admitting: Endocrinology

## 2020-06-28 ENCOUNTER — Encounter: Payer: TRICARE For Life (TFL) | Admitting: Endocrinology

## 2020-06-28 ENCOUNTER — Other Ambulatory Visit: Payer: Self-pay

## 2020-06-29 NOTE — Progress Notes (Signed)
This encounter was created in error - please disregard.

## 2020-07-07 ENCOUNTER — Other Ambulatory Visit: Payer: Self-pay

## 2020-07-07 ENCOUNTER — Ambulatory Visit (INDEPENDENT_AMBULATORY_CARE_PROVIDER_SITE_OTHER): Payer: Medicare Other | Admitting: Endocrinology

## 2020-07-07 ENCOUNTER — Encounter: Payer: Self-pay | Admitting: Endocrinology

## 2020-07-07 VITALS — BP 112/60 | HR 79 | Ht 62.0 in | Wt 151.0 lb

## 2020-07-07 DIAGNOSIS — E7800 Pure hypercholesterolemia, unspecified: Secondary | ICD-10-CM | POA: Diagnosis not present

## 2020-07-07 DIAGNOSIS — F331 Major depressive disorder, recurrent, moderate: Secondary | ICD-10-CM

## 2020-07-07 DIAGNOSIS — E1065 Type 1 diabetes mellitus with hyperglycemia: Secondary | ICD-10-CM

## 2020-07-07 NOTE — Patient Instructions (Addendum)
Lantus 7 in am and 14 at supper  Take Reglan at bedtime

## 2020-07-07 NOTE — Progress Notes (Signed)
Patient ID: Sheryl Matthews, female   DOB: 03/24/1966, 54 y.o.   MRN: [id number]          Reason for Appointment : Follow-up for Type 1 Diabetes  History of Present Illness          Diagnosis: Type 1 diabetes mellitus, date of diagnosis:  at age 10    Previous history:   She has been on insulin since the time of diagnosis. She was on the same program of NPH and Regular Insulin since diagnosis was made when she was first seen here  Recent history:   INSULIN regimen is described as:  Lantus 8 units a.m.-- 12 units at 9 p.m., Novolog 5-8 units,  1: 10 carbohydrate ratio, using. 1:50 correction.  Her A1c again the same at 8   Glucose patterns, management and problems identified:    She has had very poor control again with marked hyperglycemia and inconsistent blood sugars  Most of her hyperglycemia is related to excessive snacking and binge eating and only occasionally related to mealtime hyperglycemia as discussed below  HIGHEST blood sugars are and mostly after midnight PM until about 4 AM  Hypoglycemia be only related to occasionally to overcorrection  Otherwise she is trying to take correction doses of NovoLog when blood sugars are going up including late at night occasionally with variable degrees of correction achieved  She is gaining weight despite her gastroparesis history likely from excessive carbohydrate intake and snacks, because of gastroparesis she is eating a low fiber diet with more carbohydrates      CONTINUOUS GLUCOSE MONITORING RECORD INTERPRETATION    Dates of Recording:  Last 2 weeks Sensor description: G6  Results statistics:   CGM use % of time  98  Average and SD  210+/-77  Time in range    35    % was 55  % Time Above 180  64  % Time above 250  31  % Time Below target  1.3    PRE-MEAL Fasting Lunch Dinner  2-3 AM Overall  Glucose range:       Mean/median:  185  207  220  261    POST-MEAL PC Breakfast PC Lunch PC Dinner  Glucose range:       Mean/median:  176  205  205    Glycemic patterns summary: Blood sugars show considerable variation throughout the day and night As before her blood sugars are well above target range overnight and generally between 220 and 250 until at least 5 AM Blood sugars the rest of the day are quite variable without any consistent postprandial spikes  Hyperglycemic episodes are occurring mostly overnight, starting at different times as also in the last week after about 4 PM.  Overnight hypoglycemia was more prominent in the first week when she also had significant hypoglycemia after evening meal  Hypoglycemic episodes occurred rarely, once in the early afternoon likely from overestimating her mealtime shot  Overnight periods: Blood sugars are primarily running higher, highest around 4 AM and then improving until morning time to a variable extent  Preprandial periods: Blood sugars are variably high at breakfast depending on her correction for high readings at night, variably high at lunch and dinnertime with averages as below  Postprandial periods:   On an average blood sugars are not consistently higher compared to Premeal readings, variable pattern seen depending on premeal reading     Wt Readings from Last 3 Encounters:  07/07/20 151 lb (68.5 kg)  06/28/20 146  lb (66.2 kg)  02/17/20 141 lb (64 kg)     Diabetes labs:  Lab Results  Component Value Date   HGBA1C 8.0 (H) 06/25/2020   HGBA1C 7.8 (H) 12/03/2019   HGBA1C 7.8 (H) 09/02/2019   Lab Results  Component Value Date   MICROALBUR <0.7 12/03/2019   LDLCALC 119 (H) 04/28/2019   CREATININE 0.79 06/25/2020    Lab Results  Component Value Date   FRUCTOSAMINE 347 (H) 07/12/2018   FRUCTOSAMINE 340 (H) 06/11/2017     Allergies as of 07/07/2020      Reactions   Latex Rash   Tape Rash   Elavil [amitriptyline Hcl]    Groggy/constipation/did not think worked    Lyrica [pregabalin]    Did not have benefit.  Falling  asleep. Feeling depressed.      Medication List       Accurate as of July 07, 2020  8:56 PM. If you have any questions, ask your nurse or doctor.        ALPRAZolam 0.5 MG tablet Commonly known as: XANAX 1 tablet   buPROPion 300 MG 24 hr tablet Commonly known as: WELLBUTRIN XL Take 300 mg by mouth daily.   Contour Next Test test strip Generic drug: glucose blood USE AS INSTRUCTED TO CHECK BLOOD SUGAR 6 TIMES DAILY   Dexcom G6 Sensor Misc 1 each by Does not apply route See admin instructions. Apply 1 sensor to body once every 10 days.   doxepin 10 MG capsule Commonly known as: SINEQUAN Take 10 mg by mouth at bedtime as needed.   famotidine 20 MG tablet Commonly known as: PEPCID Take 20 mg by mouth 2 (two) times daily.   FLUoxetine 20 MG capsule Commonly known as: PROZAC Take 4 capsules (80 mg total) by mouth daily.   fluticasone 50 MCG/ACT nasal spray Commonly known as: FLONASE USE 1 SPRAY IN EACH NOSTRIL DAILY   gabapentin 300 MG capsule Commonly known as: NEURONTIN TAKE 1 CAPSULE DAILY.   GINGER ROOT PO Take by mouth.   Glucagon Emergency 1 MG Kit INJECT 1 MG INTO THE MUSCLE FOR SEVERE HYPOGLYCEMIA   HAIR/SKIN/NAILS PO Take by mouth.   ibuprofen 200 MG tablet Commonly known as: ADVIL Take 200 mg by mouth as needed.   Insulin Pen Needle 32G X 4 MM Misc Commonly known as: BD Pen Needle Nano U/F Use 7 per day to inject insulin   Ketostix strip Generic drug: acetone (urine) test USE TO CHECK URINE KETONES IF BLOOD SUGAR IS OVER 300 AS DIRECTED BY PHYSICIAN   Lantus SoloStar 100 UNIT/ML Solostar Pen Generic drug: insulin glargine Inject into the skin daily. Inject 8 units under the skin in the morning and 12 units in the evening.   lubiprostone 24 MCG capsule Commonly known as: AMITIZA Take 24 mcg by mouth 2 (two) times daily with a meal.   metoCLOPramide 5 MG tablet Commonly known as: REGLAN Take 5 mg by mouth 4 (four) times daily.    NovoLOG FlexPen 100 UNIT/ML FlexPen Generic drug: insulin aspart Inject 7-8 units under the skin three times daily before meals.   ondansetron 4 MG tablet Commonly known as: ZOFRAN Take 4 mg by mouth every 8 (eight) hours as needed for nausea or vomiting.   pantoprazole 40 MG tablet Commonly known as: PROTONIX Take 40 mg by mouth daily.   PROBIOTIC ADVANCED PO Take by mouth.   TURMERIC CURCUMIN PO Take by mouth.       Allergies:  Allergies  Allergen Reactions   Latex Rash   Tape Rash   Elavil [Amitriptyline Hcl]     Groggy/constipation/did not think worked    Lyrica [Pregabalin]     Did not have benefit.  Falling asleep. Feeling depressed.    Past Medical History:  Diagnosis Date   Bipolar 1 disorder ([place])    Deafness in left ear    Depression    Diabetes mellitus without complication ([facility])    Gastroparesis    Osteoarthritis    Pain of left breast    Skin disorder     Past Surgical History:  Procedure Laterality Date   ANKLE FRACTURE SURGERY Left    BREAST ENHANCEMENT SURGERY     BREAST EXCISIONAL BIOPSY Left    CERVICAL DISC SURGERY     CESAREAN SECTION     cosmetic surgery facial     ELBOW SURGERY Right    GALLBLADDER SURGERY     INNER EAR SURGERY     MINOR CARPAL TUNNEL Left     Family History  Problem Relation Age of Onset   Depression Sister    Alcohol abuse Brother    Diabetes Brother    Depression Brother    Breast cancer Maternal Aunt     Social History:  reports that she has never smoked. She has never used smokeless tobacco. She reports previous alcohol use. She reports that she does not use drugs.     ROS     Lipids: LDL, Not on any statins, LDL was better in 10/20 with cutting back on junk food  Lab Results  Component Value Date   CHOL 217 (H) 04/28/2019   HDL 83.90 04/28/2019   LDLCALC 119 (H) 04/28/2019   LDLDIRECT 85.0 09/02/2019   TRIG 70.0 04/28/2019   CHOLHDL 3 04/28/2019     Depression: She is taking Prozac 20 mg but she thinks that she needs better treatment and more psychological counseling Also having difficulty with binge eating     She has a history of Numbness and, tingling in her feet   Gastroparesis: She has nausea, vomiting with fair control, says she has some nausea in the morning also but not taking the Reglan at bedtime   Physical Examination:  BP 112/60 (BP Location: Left Arm, Patient Position: Sitting, Cuff Size: Normal)    Pulse 79    Ht '5\' 2"'$  (1.575 m)    Wt 151 lb (68.5 kg)    LMP 01/09/2018    SpO2 98%    BMI 27.62 kg/m     ASSESSMENT:  Diabetes type 1, long-standing and persistently poorly controlled Diabetic gastroparesis, recently diagnosed  See history of present illness for detailed discussion of current diabetes management, blood sugar patterns and problems identified  Her A1c is 8%  Her main difficulty is postprandial hyperglycemia related to snacks and binge eating Also not clear why she has more consistently high readings overnight than any other time Morning sugars are improved but mostly if she is taking correction doses when she is alerted to high readings late at night Time within target is only 35% No excessive hypoglycemia  Again she does not think she is capable of learning how to use the insulin pump even though she has a [employer] pump at home  Gastroparesis: She may be a candidate for erythromycin In the meantime she can try adding Reglan at bedtime   PLAN:     Reduce Lantus to 7 units in the morning and increase it to 14 in the  evening  She will need to exercise more regularly  She needs to proactively take extra insulin when she starts eating a lot of snacks  She needs to try and make correction doses for her nighttime hyperglycemia more regularly  Consider insulin pump when she is prepared for this and again discussed the benefits of using closed-loop system   Will refer her to psychiatrist  for second opinion  Follow-up in 3 months  Patient Instructions  Lantus 7 in am and 14 at supper  Take Reglan at bedtime       Elayne Snare 07/07/2020, 8:56 PM   Note: This note was prepared with Dragon voice recognition system technology. Any transcriptional errors that result from this process are unintentional.    Elayne Snare

## 2020-07-13 DIAGNOSIS — M65311 Trigger thumb, right thumb: Secondary | ICD-10-CM | POA: Diagnosis not present

## 2020-07-27 ENCOUNTER — Telehealth: Payer: Self-pay | Admitting: Endocrinology

## 2020-07-27 NOTE — Telephone Encounter (Signed)
Patient called requesting transmitters called in for her Dexcom. She asked if we could fax it to:  [phone]

## 2020-07-27 NOTE — Telephone Encounter (Signed)
Spoke with [facility] w/[employer]. He is sending a faxed request for the patient's transmitters.

## 2020-07-30 DIAGNOSIS — Z01818 Encounter for other preprocedural examination: Secondary | ICD-10-CM | POA: Diagnosis not present

## 2020-08-03 NOTE — Telephone Encounter (Signed)
Patient called to follow up on the Transmitter refill. Told her, from looking at the last note, we were still waiting on a request, patient wondering if we were still waiting?

## 2020-08-04 ENCOUNTER — Telehealth: Payer: Self-pay | Admitting: Endocrinology

## 2020-08-04 NOTE — Telephone Encounter (Signed)
Paperwork has been received, signed off, and faxed back today

## 2020-08-04 NOTE — Telephone Encounter (Signed)
Did you see paperwork in things to be done for this patient regarding Dexcom transmitters from [place]?

## 2020-08-04 NOTE — Telephone Encounter (Signed)
Patient requests to be called at ph#  [phone] re: Patient requests advise on medication dosage in the morning if blood sugars are 300 due to patient is having surgery on 08/18/20. Patient wants to know if she should take half of insulin if her blood sugars are 300 the day of the surgery. Also, patient never received transmitter for her Dexcom. Also, patient requests that her most recent lab results be sent to Dr. Lavone Neri (Plastic Surgeon). Patient does not know fax# at this time but will call back with fax#.

## 2020-08-04 NOTE — Telephone Encounter (Signed)
I have discussed with her insulin adjustments. She will take 5 units of Lantus in the morning instead of 7 unless the blood sugar is low. She will take correction dose of NovoLog only if blood sugar is over 200 and bring it down to 200 level. Noah please check on the transmitter question

## 2020-08-05 NOTE — Telephone Encounter (Signed)
Contacted [facility], and all they are in need of at this time is last office visit notes. This has been faxed to them.

## 2020-08-16 DIAGNOSIS — Z20822 Contact with and (suspected) exposure to covid-19: Secondary | ICD-10-CM | POA: Diagnosis not present

## 2020-09-27 DIAGNOSIS — F319 Bipolar disorder, unspecified: Secondary | ICD-10-CM | POA: Diagnosis not present

## 2020-10-07 ENCOUNTER — Other Ambulatory Visit: Payer: Self-pay

## 2020-10-07 ENCOUNTER — Other Ambulatory Visit (INDEPENDENT_AMBULATORY_CARE_PROVIDER_SITE_OTHER): Payer: Medicare Other

## 2020-10-07 DIAGNOSIS — E1065 Type 1 diabetes mellitus with hyperglycemia: Secondary | ICD-10-CM | POA: Diagnosis not present

## 2020-10-07 DIAGNOSIS — E7800 Pure hypercholesterolemia, unspecified: Secondary | ICD-10-CM | POA: Diagnosis not present

## 2020-10-07 LAB — COMPREHENSIVE METABOLIC PANEL
ALT: 16 U/L (ref 0–35)
AST: 16 U/L (ref 0–37)
Albumin: 4 g/dL (ref 3.5–5.2)
Alkaline Phosphatase: 96 U/L (ref 39–117)
BUN: 21 mg/dL (ref 6–23)
CO2: 32 mEq/L (ref 19–32)
Calcium: 9.1 mg/dL (ref 8.4–10.5)
Chloride: 99 mEq/L (ref 96–112)
Creatinine, Ser: 0.87 mg/dL (ref 0.40–1.20)
GFR: 74.77 mL/min (ref >60.00)
Glucose, Bld: 269 mg/dL — ABNORMAL HIGH (ref 70–99)
Potassium: 4.4 mEq/L (ref 3.5–5.1)
Sodium: 135 mEq/L (ref 135–145)
Total Bilirubin: 0.5 mg/dL (ref 0.2–1.2)
Total Protein: 6.9 g/dL (ref 6.0–8.3)

## 2020-10-07 LAB — MICROALBUMIN / CREATININE URINE RATIO
Creatinine,U: 132.4 mg/dL
Microalb Creat Ratio: 0.5 mg/g (ref 0.0–30.0)
Microalb, Ur: <0.7 mg/dL (ref 0.0–1.9)

## 2020-10-07 LAB — LIPID PANEL
Cholesterol: 221 mg/dL — ABNORMAL HIGH (ref 0–200)
HDL: 79.3 mg/dL (ref >39.00)
LDL Cholesterol: 128 mg/dL — ABNORMAL HIGH (ref 0–99)
NonHDL: 141.55
Total CHOL/HDL Ratio: 3
Triglycerides: 67 mg/dL (ref 0.0–149.0)
VLDL: 13.4 mg/dL (ref 0.0–40.0)

## 2020-10-07 LAB — HEMOGLOBIN A1C: Hgb A1c MFr Bld: 7.9 % — ABNORMAL HIGH (ref 4.6–6.5)

## 2020-10-11 ENCOUNTER — Other Ambulatory Visit: Payer: Self-pay | Admitting: Endocrinology

## 2020-10-12 ENCOUNTER — Other Ambulatory Visit: Payer: Self-pay | Admitting: *Deleted

## 2020-10-12 ENCOUNTER — Other Ambulatory Visit: Payer: Self-pay

## 2020-10-12 ENCOUNTER — Encounter: Payer: Self-pay | Admitting: Endocrinology

## 2020-10-12 ENCOUNTER — Ambulatory Visit (INDEPENDENT_AMBULATORY_CARE_PROVIDER_SITE_OTHER): Payer: Medicare Other | Admitting: Endocrinology

## 2020-10-12 VITALS — BP 110/78 | HR 96 | Ht 62.0 in | Wt 139.6 lb

## 2020-10-12 DIAGNOSIS — E7800 Pure hypercholesterolemia, unspecified: Secondary | ICD-10-CM | POA: Diagnosis not present

## 2020-10-12 DIAGNOSIS — E1065 Type 1 diabetes mellitus with hyperglycemia: Secondary | ICD-10-CM

## 2020-10-12 MED ORDER — INSULIN PEN NEEDLE 31G X 5 MM MISC: 1 refills | Status: DC

## 2020-10-12 MED ORDER — ROSUVASTATIN CALCIUM 5 MG PO TABS: 5.0000 mg | ORAL_TABLET | Freq: Every day | ORAL | 3 refills | Status: DC

## 2020-10-12 NOTE — Patient Instructions (Signed)
Snack before doing housework

## 2020-10-12 NOTE — Progress Notes (Signed)
Patient ID: Sheryl Matthews, female   DOB: 1965/12/16, 54 y.o.   MRN: [id number]          Reason for Appointment : Follow-up for Type 1 Diabetes  History of Present Illness          Diagnosis: Type 1 diabetes mellitus, date of diagnosis:  at age 29    Previous history:   She has been on insulin since the time of diagnosis. She was on the same program of NPH and Regular Insulin since diagnosis was made when she was first seen here  Recent history:   INSULIN regimen is described as:  Lantus 8 units a.m.-- 12 units at 9 p.m., Novolog 5-8 units,  1: 10 carbohydrate ratio, using. 1:50 correction.  Her A1c again the same at 7.9, was 8   Glucose patterns, management and problems identified:    She has relatively better blood sugars compared to her previous visit although inconsistent, averaging 170 now and also her time in range is up to 58 % compared to 35  She says she is trying to avoid hyperglycemia with modifying her diet but is not able to do this consistently and especially in the last week she has had significant high sugars from snacking or binging; she said that sometimes because of depression she will not eat much at all  She has lost weight from her liposuction procedure  She was told to try a little less Lantus insulin in the morning and go up to 14 in the evening but she continues the same dose   CGM interpretation indicates the following patterns   HIGHEST blood sugar is on an average late at night again significant variability, otherwise blood sugars are pulmonary only modestly higher throughout the day  HYPERGLYCEMIA occurs overnight and late evening mostly and sporadically late morning or early afternoon  Overnight blood sugars are averaging just over 200 starting at 11 PM until at least 5 AM  More so in the last week  Hypoglycemia has been occurring twice around 4 PM, transiently at midday and once around 7 PM  Postprandial blood sugars are generally higher in the  morning after breakfast in the first week but not recently and in the evenings mostly in the second week, post lunch readings are generally fairly good with the exceptions twice   CGM use % of time  100  2-week average/SD 171 +/-70  Time in range      58 % was 35  % Time Above 180  38  % Time above 250  17  % Time Below 70  3.3     PRE-MEAL Fasting Lunch Dinner  1-2 AM Overall  Glucose range:       Averages:  148  143  160  211    POST-MEAL PC Breakfast PC Lunch PC Dinner  Glucose range:     Averages:  164  155  181    Results statistics:   CGM use % of time  98  Average and SD  210+/-77  Time in range    35    % was 55  % Time Above 180  64  % Time above 250  31  % Time Below target  1.3    PRE-MEAL Fasting Lunch Dinner  2-3 AM Overall  Glucose range:       Mean/median:  185  207  220  261    POST-MEAL PC Breakfast PC Lunch PC Dinner  Glucose range:  Mean/median:  176  205  205    Glycemic patterns summary: Blood sugars show considerable variation throughout the day and night As before her blood sugars are well above target range overnight and generally between 220 and 250 until at least 5 AM Blood sugars the rest of the day are quite variable without any consistent postprandial spikes  Hyperglycemic episodes are occurring mostly overnight, starting at different times as also in the last week after about 4 PM.  Overnight hypoglycemia was more prominent in the first week when she also had significant hypoglycemia after evening meal  Hypoglycemic episodes occurred rarely, once in the early afternoon likely from overestimating her mealtime shot  Overnight periods: Blood sugars are primarily running higher, highest around 4 AM and then improving until morning time to a variable extent  Preprandial periods: Blood sugars are variably high at breakfast depending on her correction for high readings at night, variably high at lunch and dinnertime with averages as  below  Postprandial periods:   On an average blood sugars are not consistently higher compared to Premeal readings, variable pattern seen depending on premeal reading     Wt Readings from Last 3 Encounters:  10/12/20 139 lb 9.6 oz (63.3 kg)  07/07/20 151 lb (68.5 kg)  06/28/20 146 lb (66.2 kg)     Diabetes labs:  Lab Results  Component Value Date   HGBA1C 7.9 (H) 10/07/2020   HGBA1C 8.0 (H) 06/25/2020   HGBA1C 7.8 (H) 12/03/2019   Lab Results  Component Value Date   MICROALBUR <0.7 10/07/2020   LDLCALC 128 (H) 10/07/2020   CREATININE 0.87 10/07/2020    Lab Results  Component Value Date   FRUCTOSAMINE 347 (H) 07/12/2018   FRUCTOSAMINE 340 (H) 06/11/2017     Allergies as of 10/12/2020      Reactions   Latex Rash   Tape Rash   Elavil [amitriptyline Hcl]    Groggy/constipation/did not think worked    Lyrica [pregabalin]    Did not have benefit.  Falling asleep. Feeling depressed.      Medication List       Accurate as of October 12, 2020  1:33 PM. If you have any questions, ask your nurse or doctor.        ALPRAZolam 0.5 MG tablet Commonly known as: XANAX 1 tablet   buPROPion 300 MG 24 hr tablet Commonly known as: WELLBUTRIN XL Take 450 mg by mouth daily. Patient takes 450 mg per day   Contour Next Test test strip Generic drug: glucose blood USE AS INSTRUCTED TO CHECK BLOOD SUGAR 6 TIMES DAILY   Dexcom G6 Sensor Misc 1 each by Does not apply route See admin instructions. Apply 1 sensor to body once every 10 days.   doxepin 10 MG capsule Commonly known as: SINEQUAN Take 10 mg by mouth at bedtime as needed.   famotidine 20 MG tablet Commonly known as: PEPCID Take 20 mg by mouth 2 (two) times daily.   FLUoxetine 20 MG capsule Commonly known as: PROZAC Take 4 capsules (80 mg total) by mouth daily.   fluticasone 50 MCG/ACT nasal spray Commonly known as: FLONASE USE 1 SPRAY IN EACH NOSTRIL DAILY   gabapentin 300 MG capsule Commonly known  as: NEURONTIN TAKE 1 CAPSULE DAILY.   GINGER ROOT PO Take by mouth.   Glucagon Emergency 1 MG Kit INJECT 1 MG INTO THE MUSCLE FOR SEVERE HYPOGLYCEMIA   HAIR/SKIN/NAILS PO Take by mouth.   ibuprofen 200 MG tablet Commonly known  as: ADVIL Take 200 mg by mouth as needed.   Insulin Pen Needle 32G X 4 MM Misc Commonly known as: BD Pen Needle Nano U/F Use 7 per day to inject insulin   Ketostix strip Generic drug: acetone (urine) test USE TO CHECK URINE KETONES IF BLOOD SUGAR IS OVER 300 AS DIRECTED BY PHYSICIAN   Lantus SoloStar 100 UNIT/ML Solostar Pen Generic drug: insulin glargine Inject into the skin daily. Inject 8 units under the skin in the morning and 12 units in the evening.   lubiprostone 24 MCG capsule Commonly known as: AMITIZA Take 24 mcg by mouth 2 (two) times daily with a meal.   meloxicam 15 MG tablet Commonly known as: MOBIC Take 15 mg by mouth daily.   metoCLOPramide 5 MG tablet Commonly known as: REGLAN Take 5 mg by mouth 4 (four) times daily.   NovoLOG FlexPen 100 UNIT/ML FlexPen Generic drug: insulin aspart Inject 7-8 units under the skin three times daily before meals.   ondansetron 4 MG tablet Commonly known as: ZOFRAN Take 4 mg by mouth every 8 (eight) hours as needed for nausea or vomiting.   pantoprazole 40 MG tablet Commonly known as: PROTONIX Take 40 mg by mouth daily.   PROBIOTIC ADVANCED PO Take by mouth.   TURMERIC CURCUMIN PO Take by mouth.       Allergies:  Allergies  Allergen Reactions  . Latex Rash  . Tape Rash  . Elavil [Amitriptyline Hcl]     Groggy/constipation/did not think worked   . Lyrica [Pregabalin]     Did not have benefit.  Falling asleep. Feeling depressed.    Past Medical History:  Diagnosis Date  . Bipolar 1 disorder ([place])   . Deafness in left ear   . Depression   . Diabetes mellitus without complication ([place])   . Gastroparesis   . Osteoarthritis   . Pain of left breast   . Skin disorder      Past Surgical History:  Procedure Laterality Date  . ANKLE FRACTURE SURGERY Left   . BREAST ENHANCEMENT SURGERY    . BREAST EXCISIONAL BIOPSY Left   . CERVICAL DISC SURGERY    . CESAREAN SECTION    . cosmetic surgery facial    . ELBOW SURGERY Right   . GALLBLADDER SURGERY    . INNER EAR SURGERY    . MINOR CARPAL TUNNEL Left     Family History  Problem Relation Age of Onset  . Depression Sister   . Alcohol abuse Brother   . Diabetes Brother   . Depression Brother   . Breast cancer Maternal Aunt     Social History:  reports that she has never smoked. She has never used smokeless tobacco. She reports previous alcohol use. She reports that she does not use drugs.     ROS    Lipids: LDL, Not on any statins, LDL was better in 10/20 with cutting back on junk food but now consistently higher She says her sister also has hypercholesterolemia  Lab Results  Component Value Date   CHOL 221 (H) 10/07/2020   CHOL 217 (H) 04/28/2019   CHOL 187 07/12/2018   Lab Results  Component Value Date   HDL 79.30 10/07/2020   HDL 83.90 04/28/2019   HDL 80.40 07/12/2018   Lab Results  Component Value Date   LDLCALC 128 (H) 10/07/2020   LDLCALC 119 (H) 04/28/2019   LDLCALC 93 07/12/2018   Lab Results  Component Value Date   TRIG 67.0  10/07/2020   TRIG 70.0 04/28/2019   TRIG 65.0 07/12/2018   Lab Results  Component Value Date   CHOLHDL 3 10/07/2020   CHOLHDL 3 04/28/2019   CHOLHDL 2 07/12/2018   Lab Results  Component Value Date   LDLDIRECT 85.0 09/02/2019     Depression: Not consistently controlled    She has a history of Numbness and, tingling in her feet   Gastroparesis: She has nausea, vomiting with variable control, says she has some nausea in the morning.  Taking Reglan   Physical Examination:  BP 110/78   Pulse 96   Ht $R'5\' 2"'Bs$  (1.575 m)   Wt 139 lb 9.6 oz (63.3 kg)   LMP 01/09/2018   SpO2 97%   BMI 25.53 kg/m     ASSESSMENT:  Diabetes type 1,  long-standing and persistently poorly controlled Diabetic gastroparesis, recently diagnosed  See history of present illness for detailed discussion of current diabetes management, blood sugar patterns and problems identified  Her A1c is 7.9  Recently overall her blood sugars are better although inconsistent Still has difficulty controlling postprandial hyperglycemia when she is eating a lot of snacks and sweets, lately this is mostly in the evenings. This continues to be a problem even though she tries to take extra insulin to cover the high readings Hypoglycemia is usually when she is more active and may have also taken extra insulin to correct high readings Generally Premeal readings at breakfast and dinnertime are fairly good indicating that Lantus dose is appropriate  Again discussed that ideal management would be with the help of a closed-loop insulin pump which he does not want to do  Gastroparesis: Continue Reglan  Hyperlipidemia: She agrees to start Crestor 5 mg daily for cardiovascular risk reduction since unlikely that she will improve her diet enough for lipid control also   PLAN:     No change in insulin regimen  Discussed appropriate treatment of low sugars  To have a snack before doing a lot of housework activity  She will try to cut back on large carbohydrate snacks and sweets especially at night    Follow-up in 3 months  There are no Patient Instructions on file for this visit.      Elayne Snare 10/12/2020, 1:33 PM   Note: This note was prepared with Dragon voice recognition system technology. Any transcriptional errors that result from this process are unintentional.    Elayne Snare

## 2020-10-13 ENCOUNTER — Other Ambulatory Visit: Payer: Self-pay | Admitting: *Deleted

## 2020-10-13 MED ORDER — FIASP FLEXTOUCH 100 UNIT/ML ~~LOC~~ SOPN: 8.0000 [IU] | PEN_INJECTOR | Freq: Three times a day (TID) | SUBCUTANEOUS | 1 refills | Status: DC

## 2020-10-15 ENCOUNTER — Telehealth: Payer: Self-pay | Admitting: Endocrinology

## 2020-10-15 NOTE — Telephone Encounter (Signed)
New message    Pt c/o medication issue:  1. Name of Medication: insulin aspart (NOVOLOG FLEXPEN) 100 UNIT/ML FlexPen  2.. What is your medication issue? [facility] has change the brand needs to confirm with MD

## 2020-10-15 NOTE — Telephone Encounter (Signed)
I spoke with her to let her know that we had received a fax from [facility] that they are out of novolog and needed a substitute, Dr. Dwyane Dee sent in [place] in place of the novolog.

## 2020-10-18 ENCOUNTER — Other Ambulatory Visit: Payer: Self-pay | Admitting: *Deleted

## 2020-10-18 MED ORDER — INSULIN LISPRO (1 UNIT DIAL) 100 UNIT/ML (KWIKPEN)
7.0000 [IU] | PEN_INJECTOR | Freq: Three times a day (TID) | SUBCUTANEOUS | 1 refills | Status: DC
Start: 2020-10-18 — End: 2021-09-14

## 2020-10-25 DIAGNOSIS — F319 Bipolar disorder, unspecified: Secondary | ICD-10-CM | POA: Diagnosis not present

## 2020-11-23 DIAGNOSIS — F319 Bipolar disorder, unspecified: Secondary | ICD-10-CM | POA: Diagnosis not present

## 2020-11-25 DIAGNOSIS — H0100A Unspecified blepharitis right eye, upper and lower eyelids: Secondary | ICD-10-CM | POA: Diagnosis not present

## 2020-11-25 DIAGNOSIS — H527 Unspecified disorder of refraction: Secondary | ICD-10-CM | POA: Diagnosis not present

## 2020-11-25 DIAGNOSIS — E103293 Type 1 diabetes mellitus with mild nonproliferative diabetic retinopathy without macular edema, bilateral: Secondary | ICD-10-CM | POA: Diagnosis not present

## 2020-11-25 DIAGNOSIS — H43813 Vitreous degeneration, bilateral: Secondary | ICD-10-CM | POA: Diagnosis not present

## 2020-11-25 DIAGNOSIS — H0100B Unspecified blepharitis left eye, upper and lower eyelids: Secondary | ICD-10-CM | POA: Diagnosis not present

## 2020-11-25 DIAGNOSIS — Z961 Presence of intraocular lens: Secondary | ICD-10-CM | POA: Diagnosis not present

## 2020-11-25 DIAGNOSIS — L908 Other atrophic disorders of skin: Secondary | ICD-10-CM | POA: Diagnosis not present

## 2020-12-02 DIAGNOSIS — F319 Bipolar disorder, unspecified: Secondary | ICD-10-CM | POA: Diagnosis not present

## 2020-12-03 DIAGNOSIS — H7292 Unspecified perforation of tympanic membrane, left ear: Secondary | ICD-10-CM | POA: Diagnosis not present

## 2020-12-03 DIAGNOSIS — H6123 Impacted cerumen, bilateral: Secondary | ICD-10-CM | POA: Diagnosis not present

## 2020-12-21 DIAGNOSIS — K219 Gastro-esophageal reflux disease without esophagitis: Secondary | ICD-10-CM | POA: Diagnosis not present

## 2020-12-21 DIAGNOSIS — K3184 Gastroparesis: Secondary | ICD-10-CM | POA: Diagnosis not present

## 2020-12-21 DIAGNOSIS — K5909 Other constipation: Secondary | ICD-10-CM | POA: Diagnosis not present

## 2021-01-03 DIAGNOSIS — F319 Bipolar disorder, unspecified: Secondary | ICD-10-CM | POA: Diagnosis not present

## 2021-01-05 ENCOUNTER — Telehealth (INDEPENDENT_AMBULATORY_CARE_PROVIDER_SITE_OTHER): Payer: Medicare Other | Admitting: Physician Assistant

## 2021-01-05 ENCOUNTER — Encounter: Payer: Self-pay | Admitting: Physician Assistant

## 2021-01-05 VITALS — Temp 98.3°F | Ht 62.0 in | Wt 139.0 lb

## 2021-01-05 DIAGNOSIS — M542 Cervicalgia: Secondary | ICD-10-CM | POA: Diagnosis not present

## 2021-01-05 DIAGNOSIS — M549 Dorsalgia, unspecified: Secondary | ICD-10-CM | POA: Diagnosis not present

## 2021-01-05 DIAGNOSIS — W19XXXA Unspecified fall, initial encounter: Secondary | ICD-10-CM | POA: Diagnosis not present

## 2021-01-05 MED ORDER — HYDROCODONE-ACETAMINOPHEN 5-325 MG PO TABS: 1.0000 | ORAL_TABLET | Freq: Four times a day (QID) | ORAL | 0 refills | Status: AC | PRN
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
## 2021-01-05 NOTE — Progress Notes (Signed)
Slid down steps on 01/03/2021 Back of head hit the bottom of steps  No loss of consciousness Pain is in the neck (right/middle) and right flank   No headaches or memory issues  Has been taking tylenol for pain and using heating pad - helping some

## 2021-01-06 DIAGNOSIS — Z791 Long term (current) use of non-steroidal anti-inflammatories (NSAID): Secondary | ICD-10-CM | POA: Diagnosis not present

## 2021-01-06 DIAGNOSIS — E109 Type 1 diabetes mellitus without complications: Secondary | ICD-10-CM | POA: Diagnosis not present

## 2021-01-06 DIAGNOSIS — Z91048 Other nonmedicinal substance allergy status: Secondary | ICD-10-CM | POA: Diagnosis not present

## 2021-01-06 DIAGNOSIS — M329 Systemic lupus erythematosus, unspecified: Secondary | ICD-10-CM | POA: Diagnosis not present

## 2021-01-06 DIAGNOSIS — Z888 Allergy status to other drugs, medicaments and biological substances status: Secondary | ICD-10-CM | POA: Diagnosis not present

## 2021-01-06 DIAGNOSIS — S299XXA Unspecified injury of thorax, initial encounter: Secondary | ICD-10-CM | POA: Diagnosis not present

## 2021-01-06 DIAGNOSIS — R0789 Other chest pain: Secondary | ICD-10-CM | POA: Diagnosis not present

## 2021-01-06 DIAGNOSIS — F319 Bipolar disorder, unspecified: Secondary | ICD-10-CM | POA: Diagnosis not present

## 2021-01-06 DIAGNOSIS — K219 Gastro-esophageal reflux disease without esophagitis: Secondary | ICD-10-CM | POA: Diagnosis not present

## 2021-01-06 DIAGNOSIS — Z981 Arthrodesis status: Secondary | ICD-10-CM | POA: Diagnosis not present

## 2021-01-06 DIAGNOSIS — F419 Anxiety disorder, unspecified: Secondary | ICD-10-CM | POA: Diagnosis not present

## 2021-01-06 DIAGNOSIS — M542 Cervicalgia: Secondary | ICD-10-CM | POA: Diagnosis not present

## 2021-01-06 DIAGNOSIS — R519 Headache, unspecified: Secondary | ICD-10-CM | POA: Diagnosis not present

## 2021-01-06 DIAGNOSIS — Z91040 Latex allergy status: Secondary | ICD-10-CM | POA: Diagnosis not present

## 2021-01-06 DIAGNOSIS — Z79899 Other long term (current) drug therapy: Secondary | ICD-10-CM | POA: Diagnosis not present

## 2021-01-06 DIAGNOSIS — G8911 Acute pain due to trauma: Secondary | ICD-10-CM | POA: Diagnosis not present

## 2021-01-06 DIAGNOSIS — M069 Rheumatoid arthritis, unspecified: Secondary | ICD-10-CM | POA: Diagnosis not present

## 2021-01-06 DIAGNOSIS — G4733 Obstructive sleep apnea (adult) (pediatric): Secondary | ICD-10-CM | POA: Diagnosis not present

## 2021-01-06 DIAGNOSIS — Z794 Long term (current) use of insulin: Secondary | ICD-10-CM | POA: Diagnosis not present

## 2021-01-06 DIAGNOSIS — G44319 Acute post-traumatic headache, not intractable: Secondary | ICD-10-CM | POA: Diagnosis not present

## 2021-01-07 ENCOUNTER — Telehealth: Payer: Self-pay

## 2021-01-07 ENCOUNTER — Other Ambulatory Visit: Payer: Self-pay

## 2021-01-07 ENCOUNTER — Encounter: Payer: Self-pay | Admitting: Nurse Practitioner

## 2021-01-07 ENCOUNTER — Ambulatory Visit (INDEPENDENT_AMBULATORY_CARE_PROVIDER_SITE_OTHER): Payer: Medicare Other | Admitting: Nurse Practitioner

## 2021-01-07 ENCOUNTER — Ambulatory Visit (INDEPENDENT_AMBULATORY_CARE_PROVIDER_SITE_OTHER): Payer: Medicare Other

## 2021-01-07 VITALS — BP 90/58 | HR 90 | Temp 98.0°F | Ht 62.0 in | Wt 147.4 lb

## 2021-01-07 DIAGNOSIS — W109XXA Fall (on) (from) unspecified stairs and steps, initial encounter: Secondary | ICD-10-CM | POA: Diagnosis not present

## 2021-01-07 DIAGNOSIS — W19XXXD Unspecified fall, subsequent encounter: Secondary | ICD-10-CM

## 2021-01-07 DIAGNOSIS — M545 Low back pain, unspecified: Secondary | ICD-10-CM

## 2021-01-07 MED ORDER — KETOROLAC TROMETHAMINE 60 MG/2ML IM SOLN
60.0000 mg | Freq: Once | INTRAMUSCULAR | Status: AC
  Administered 2021-01-07: 60 mg via INTRAMUSCULAR

## 2021-01-07 MED ORDER — CYCLOBENZAPRINE HCL 5 MG PO TABS: 5.0000 mg | ORAL_TABLET | Freq: Three times a day (TID) | ORAL | 0 refills | Status: DC | PRN

## 2021-01-07 NOTE — Telephone Encounter (Signed)
Transition Care Management Follow-up Telephone Call  Date of discharge and from where: 01/06/21 from [facility]  How have you been since you were released from the hospital? Pt states that she is still in pain and that it hurts to move certain ways. Pt states that the pain is felt when she breaths.   Any questions or concerns? No  Items Reviewed:  Did the pt receive and understand the discharge instructions provided? Yes   Medications obtained and verified? Yes   Other? No   Any new allergies since your discharge? No   Dietary orders reviewed? N/A  Do you have support at home? Yes   Functional Questionnaire: (I = Independent and D = Dependent) ADLs: I  Bathing/Dressing- I  Meal Prep- I  Eating- I  Maintaining continence- I  Transferring/Ambulation- I  Managing Meds- I  Follow up appointments reviewed:   PCP Hospital f/u appt confirmed? Yes  Scheduled to see Jacolyn Reedy, NP on 01/07/2021 @ 11:20.  Are transportation arrangements needed? No  If their condition worsens, is the pt aware to call PCP or go to the Emergency Dept.? Yes Was the patient provided with contact information for the PCP's office or ED? Yes Was to pt encouraged to call back with questions or concerns? Yes

## 2021-01-07 NOTE — Progress Notes (Signed)
Acute Office Visit  Subjective:    Patient ID: Sheryl Matthews, female    DOB: 1966-05-24, 55 y.o.   MRN: [id number]  Chief Complaint  Patient presents with  . Follow-up    [facility] 01/06/2021  . [place] on 01/03/2021  . Back Pain    Rates pain at this moment at a 9/10     HPI Patient is in today for follow-up for back pain after a fall occurring on Monday 01/03/2021. She reports that while walking down a set of concrete stairs she lost her footing and slipped landing on her back at the bottom of the stairs and the occipital region of her head hit the last concrete step. She was seen virtually by her PCP on 01/05/2021 for resulting back, neck, and head pain after the fall and was found to have no warning signs or concerning symptoms for severe injury. She was prescribed Norco for pain as needed. On 01/06/2021 she presented to the emergency department for further evaluation due to ongoing pain. There she was evaluated by chest x-ray and CT of the head and neck- both of which were negative for any acute findings. She was prescribed Ibuprofen 800mg .   She tells me that the pain in her head and neck has improved, but she is experiencing significant right sided flank/lumbar pain in the area where she landed on a concrete paver during the fall. She describes the pain as "sharp" and "shooting" pain with movement. She reports that the Norco and Ibuprofen are helpful for her pain, but as soon as the medication wears off the pain returns. She tells me that her pain is 9/10 with lateral movement to the left side and she wakes multiple times a night due to pain while moving in her sleep. She has not been using ice or heat to the area and reports that she has not consistently used the ibuprofen. She denies bowel or urinary incontinence, saddle features, ecchymosis, difficulty breathing or taking a deep breath, abdominal pain, or weakness or numbness in her lower extremities.   Past Medical History:   Diagnosis Date  . Bipolar 1 disorder ([place])   . Deafness in left ear   . Depression   . Diabetes mellitus without complication ([place])   . Gastroparesis   . Osteoarthritis   . Pain of left breast   . Skin disorder     Past Surgical History:  Procedure Laterality Date  . ANKLE FRACTURE SURGERY Left   . BREAST ENHANCEMENT SURGERY    . BREAST EXCISIONAL BIOPSY Left   . CERVICAL DISC SURGERY    . CESAREAN SECTION    . cosmetic surgery facial    . ELBOW SURGERY Right   . GALLBLADDER SURGERY    . INNER EAR SURGERY    . MINOR CARPAL TUNNEL Left     Family History  Problem Relation Age of Onset  . Depression Sister   . Alcohol abuse Brother   . Diabetes Brother   . Depression Brother   . Breast cancer Maternal Aunt     Social History   Socioeconomic History  . Marital status: Married    Spouse name: Louie Casa  . Number of children: 1  . Years of education: 35  . Highest education level: Some college, no degree  Occupational History  . Occupation: Homemaker  Tobacco Use  . Smoking status: Never Smoker  . Smokeless tobacco: Never Used  Vaping Use  . Vaping Use: Never used  Substance and Sexual Activity  . Alcohol use: Not Currently    Alcohol/week: 0.0 standard drinks  . Drug use: No  . Sexual activity: Not Currently    Partners: Male    Birth control/protection: None  Other Topics Concern  . Not on file  Social History Narrative   Stays in bed a lot- has fibromyalgia   Social Determinants of Health   Financial Resource Strain: Not on file  Food Insecurity: Not on file  Transportation Needs: Not on file  Physical Activity: Not on file  Stress: Not on file  Social Connections: Not on file  Intimate Partner Violence: Not on file    Outpatient Medications Prior to Visit  Medication Sig Dispense Refill  . ALPRAZolam (XANAX) 0.5 MG tablet 1 tablet    . amitriptyline (ELAVIL) 25 MG tablet Take 25 mg by mouth at bedtime.    . ARIPiprazole (ABILIFY) 5 MG tablet      . Biotin w/ Vitamins C & E (HAIR/SKIN/NAILS PO) Take by mouth.    Marland Kitchen buPROPion (WELLBUTRIN XL) 300 MG 24 hr tablet Take 450 mg by mouth daily. Patient takes 450 mg per day    . Continuous Blood Gluc Sensor (DEXCOM G6 SENSOR) MISC 1 each by Does not apply route See admin instructions. Apply 1 sensor to body once every 10 days. 3 each 2  . CONTOUR NEXT TEST test strip USE AS INSTRUCTED TO CHECK BLOOD SUGAR 6 TIMES DAILY 500 strip 0  . doxepin (SINEQUAN) 10 MG capsule Take 10 mg by mouth at bedtime as needed.    . DULoxetine (CYMBALTA) 30 MG capsule Take 30 mg by mouth daily.    Marland Kitchen esomeprazole (NEXIUM) 40 MG capsule Nexium 40 mg capsule,delayed release    . famotidine (PEPCID) 20 MG tablet Take 20 mg by mouth 2 (two) times daily.    . fluticasone (FLONASE) 50 MCG/ACT nasal spray USE 1 SPRAY IN EACH NOSTRIL DAILY 48 g 6  . gabapentin (NEURONTIN) 300 MG capsule TAKE 1 CAPSULE DAILY. 90 capsule 0  . Ginger, Zingiber officinalis, (GINGER ROOT PO) Take by mouth.    . Glucagon, rDNA, (GLUCAGON EMERGENCY) 1 MG KIT INJECT 1 MG INTO THE MUSCLE FOR SEVERE HYPOGLYCEMIA 4 kit 5  . HYDROcodone-acetaminophen (NORCO/VICODIN) 5-325 MG tablet Take 1 tablet by mouth every 6 (six) hours as needed for up to 5 days for moderate pain. 20 tablet 0  . ibuprofen (ADVIL) 800 MG tablet Take 800 mg by mouth every 8 (eight) hours as needed.    . insulin aspart (NOVOLOG FLEXPEN) 100 UNIT/ML FlexPen Inject 7-8 units under the skin three times daily before meals. 30 mL 1  . Insulin Glargine (LANTUS SOLOSTAR) 100 UNIT/ML Solostar Pen Inject into the skin daily. Inject 8 units under the skin in the morning and 12 units in the evening.    . insulin lispro (HUMALOG KWIKPEN) 100 UNIT/ML KwikPen Inject 7-8 Units into the skin 3 (three) times daily. Please dispense a 90 day supply 45 mL 1  . Insulin Pen Needle 31G X 5 MM MISC Use 7 per day to inject insulin 650 each 1  . KETOSTIX strip USE TO CHECK URINE KETONES IF BLOOD SUGAR IS OVER 300  AS DIRECTED BY PHYSICIAN 100 each 2  . lamoTRIgine (LAMICTAL) 25 MG tablet Take by mouth.    . lubiprostone (AMITIZA) 24 MCG capsule Take 24 mcg by mouth 2 (two) times daily with a meal.    . meloxicam (MOBIC) 15 MG tablet Take  15 mg by mouth daily.    . metoCLOPramide (REGLAN) 5 MG tablet Take 5 mg by mouth 4 (four) times daily.    . ondansetron (ZOFRAN) 4 MG tablet Take 4 mg by mouth every 8 (eight) hours as needed for nausea or vomiting.    . pantoprazole (PROTONIX) 40 MG tablet Take 40 mg by mouth daily.    . Probiotic Product (PROBIOTIC ADVANCED PO) Take by mouth.    . rosuvastatin (CRESTOR) 5 MG tablet Take 1 tablet (5 mg total) by mouth daily. 30 tablet 3  . TURMERIC CURCUMIN PO Take by mouth.    . zolpidem (AMBIEN CR) 6.25 MG CR tablet     . FLUoxetine (PROZAC) 20 MG capsule Take 4 capsules (80 mg total) by mouth daily. (Patient taking differently: Take 80 mg by mouth daily. ) 360 capsule 0  . ibuprofen (ADVIL,MOTRIN) 200 MG tablet Take 200 mg by mouth as needed.     No facility-administered medications prior to visit.    Allergies  Allergen Reactions  . Latex Rash  . Tape Rash  . Elavil [Amitriptyline Hcl]     Groggy/constipation/did not think worked   . Lyrica [Pregabalin]     Did not have benefit.  Falling asleep. Feeling depressed.    Review of Systems All review of systems negative except what is listed in the HPI     Objective:    Physical Exam Vitals and nursing note reviewed.  Constitutional:      Appearance: She is ill-appearing.  HENT:     Head: Normocephalic and atraumatic.  Eyes:     Extraocular Movements: Extraocular movements intact.     Conjunctiva/sclera: Conjunctivae normal.     Pupils: Pupils are equal, round, and reactive to light.  Neck:     Comments: No  Cardiovascular:     Rate and Rhythm: Normal rate and regular rhythm.     Pulses: Normal pulses.     Heart sounds: Normal heart sounds.  Pulmonary:     Effort: Pulmonary effort is  normal.     Breath sounds: Normal breath sounds.  Abdominal:     General: Abdomen is flat.     Palpations: Abdomen is soft.  Musculoskeletal:        General: Tenderness and signs of injury present.     Cervical back: Normal range of motion. No rigidity or tenderness. No pain with movement, spinous process tenderness or muscular tenderness. Normal range of motion.     Thoracic back: Normal. No swelling, edema, deformity, spasms, tenderness or bony tenderness. Normal range of motion.     Lumbar back: Spasms and tenderness present. No bony tenderness. Decreased range of motion. Negative right straight leg raise test and negative left straight leg raise test.       Back:     Right lower leg: No edema.     Left lower leg: No edema.     Comments: No ecchymosis present to the occipital, thoracic, or lumbar region. No visible or palpable edema noted.   Skin:    General: Skin is warm and dry.     Capillary Refill: Capillary refill takes less than 2 seconds.  Neurological:     General: No focal deficit present.     Mental Status: She is alert and oriented to person, place, and time.     Sensory: No sensory deficit.     Motor: No weakness.     Coordination: Coordination normal.     Gait: Gait abnormal.  Psychiatric:        Mood and Affect: Mood normal.        Behavior: Behavior normal.        Thought Content: Thought content normal.        Judgment: Judgment normal.     BP (!) 90/58   Pulse 90   Temp 98 F (36.7 C)   Ht 5\' 2"  (1.575 m)   Wt 147 lb 6.4 oz (66.9 kg)   LMP 01/09/2018   SpO2 98%   BMI 26.96 kg/m  Wt Readings from Last 3 Encounters:  01/07/21 147 lb 6.4 oz (66.9 kg)  01/05/21 139 lb (63 kg)  10/12/20 139 lb 9.6 oz (63.3 kg)    Health Maintenance Due  Topic Date Due  . Hepatitis C Screening  Never done  . HIV Screening  Never done  . PAP SMEAR-Modifier  12/17/2018  . MAMMOGRAM  01/10/2020  . FOOT EXAM  05/01/2020  . OPHTHALMOLOGY EXAM  06/04/2020    There  are no preventive care reminders to display for this patient.   Lab Results  Component Value Date   TSH 1.19 11/05/2019   Lab Results  Component Value Date   WBC 5.3 11/05/2019   HGB 12.8 11/05/2019   HCT 39.9 11/05/2019   MCV 92.1 11/05/2019   PLT 246 11/05/2019   Lab Results  Component Value Date   NA 135 10/07/2020   K 4.4 10/07/2020   CO2 32 10/07/2020   GLUCOSE 269 (H) 10/07/2020   BUN 21 10/07/2020   CREATININE 0.87 10/07/2020   BILITOT 0.5 10/07/2020   ALKPHOS 96 10/07/2020   AST 16 10/07/2020   ALT 16 10/07/2020   PROT 6.9 10/07/2020   ALBUMIN 4.0 10/07/2020   CALCIUM 9.1 10/07/2020   GFR 74.77 10/07/2020   Lab Results  Component Value Date   CHOL 221 (H) 10/07/2020   Lab Results  Component Value Date   HDL 79.30 10/07/2020   Lab Results  Component Value Date   LDLCALC 128 (H) 10/07/2020   Lab Results  Component Value Date   TRIG 67.0 10/07/2020   Lab Results  Component Value Date   CHOLHDL 3 10/07/2020   Lab Results  Component Value Date   HGBA1C 7.9 (H) 10/07/2020       Assessment & Plan:   Problem List Items Addressed This Visit      Other   Fall   Relevant Orders   DG Lumbar Spine Complete    Other Visit Diagnoses    Acute right-sided low back pain without sciatica    -  Primary   Relevant Medications   ibuprofen (ADVIL) 800 MG tablet   cyclobenzaprine (FLEXERIL) 5 MG tablet   ketorolac (TORADOL) injection 60 mg (Completed)   Other Relevant Orders   DG Lumbar Spine Complete     Fall with pain to the right lumber/flank and decreased ROM present. Reviewed previous provider notes and reports of imaging from the emergency room. CT of head and neck and chest x-ray were all negative for acute fracture or injury.There is no evidence of evaluation of the lumbar area of the spine in the emergency room. Strong suspicions today for soft tissue injury based on evaluation. Will obtain lumbar x-ray for further evaluation to rule out possible  injury to the spine.  Toradol injection provided today for pain and inflammation relief.  Recommend starting 800mg  Ibuprofen tomorrow morning and taking every 6-8 hours on a consistent basis for a minimum of  2 days.  Recommend use of ice and heat to the affected area for 20 minutes each at least 4 times a day.  Recommend rest for the next 24-48 hours then begin gentle stretching exercises provided on handout.  Prescription for flexeril provided for muscle spasms with instructions NOT to take medication with any other medication that may be sedating including gabapentin, xanax, Norco, and zolpidem. Instructions provided to only take this medication on an as needed basis.  Her blood pressure was 90/58 in the office today- she does report that she took a Norco and Xanax this morning prior to her visit. Recommend that these medication NOT be taken together.  Will follow-up on the lumbar x-ray and make changes to the plan of care based on results, if necessary.  Discussed with patient that soft tissue injuries may take several weeks to fully resolve.     Meds ordered this encounter  Medications  . cyclobenzaprine (FLEXERIL) 5 MG tablet    Sig: Take 1 tablet (5 mg total) by mouth 3 (three) times daily as needed for muscle spasms.    Dispense:  30 tablet    Refill:  0  . ketorolac (TORADOL) injection 60 mg   No follow-ups on file. \  Orma Render, NP

## 2021-01-07 NOTE — Patient Instructions (Signed)
We will get the xray today to make sure that there is not any spinal misalignment in the lower back. I suspect this is muscular, but we want to make sure.   Use ice 20 minutes and then heat 20 minutes to the affected area at least 4 times a day.   I want you to rest for the next 2 days and take it easy then start with the gentle exercises- stop if it is too painful- don't over stretch.   I have given you a Toradol shot today to help with inflammation and pain. Starting tomorrow restart the 800mg  Ibuprofen and take this every 6-8 hours for at least the next 2 days to help with inflammation and pain. Keep it in your system around the clock for best results.   I have sent in medication called Flexeril (meloxicam) for muscle spasms. This medication is sedating. Do not take this with the pain medication or with xanax or any other medications for sleep or that make you sleepy. You can take this up to 3 times in a day for muscle spasms.   Low Back Sprain or Strain Rehab Ask your health care provider which exercises are safe for you. Do exercises exactly as told by your health care provider and adjust them as directed. It is normal to feel mild stretching, pulling, tightness, or discomfort as you do these exercises. Stop right away if you feel sudden pain or your pain gets worse. Do not begin these exercises until told by your health care provider. Stretching and range-of-motion exercises Hold each position for 15-20 seconds and repeat 1-2 times twice a day.   These exercises warm up your muscles and joints and improve the movement and flexibility of your back. These exercises also help to relieve pain, numbness, and tingling. Lumbar rotation 1. Lie on your back on a firm surface and bend your knees. 2. Straighten your arms out to your sides so each arm forms a 90-degree angle (right angle) with a side of your body. 3. Slowly move (rotate) both of your knees to one side of your body until you feel a  stretch in your lower back (lumbar). Try not to let your shoulders lift off the floor. 4. Hold this position for __________ seconds. 5. Tense your abdominal muscles and slowly move your knees back to the starting position. 6. Repeat this exercise on the other side of your body. Repeat __________ times. Complete this exercise __________ times a day.   Single knee to chest 1. Lie on your back on a firm surface with both legs straight. 2. Bend one of your knees. Use your hands to move your knee up toward your chest until you feel a gentle stretch in your lower back and buttock. ? Hold your leg in this position by holding on to the front of your knee. ? Keep your other leg as straight as possible. 3. Hold this position for __________ seconds. 4. Slowly return to the starting position. 5. Repeat with your other leg. Repeat __________ times. Complete this exercise __________ times a day.   Prone extension on elbows 1. Lie on your abdomen on a firm surface (prone position). 2. Prop yourself up on your elbows. 3. Use your arms to help lift your chest up until you feel a gentle stretch in your abdomen and your lower back. ? This will place some of your body weight on your elbows. If this is uncomfortable, try stacking pillows under your chest. ? Your hips  should stay down, against the surface that you are lying on. Keep your hip and back muscles relaxed. 4. Hold this position for __________ seconds. 5. Slowly relax your upper body and return to the starting position. Repeat __________ times. Complete this exercise __________ times a day.   Strengthening exercises These exercises build strength and endurance in your back. Endurance is the ability to use your muscles for a long time, even after they get tired. Pelvic tilt This exercise strengthens the muscles that lie deep in the abdomen. 1. Lie on your back on a firm surface. Bend your knees and keep your feet flat on the floor. 2. Tense your  abdominal muscles. Tip your pelvis up toward the ceiling and flatten your lower back into the floor. ? To help with this exercise, you may place a small towel under your lower back and try to push your back into the towel. 3. Hold this position for __________ seconds. 4. Let your muscles relax completely before you repeat this exercise. Repeat __________ times. Complete this exercise __________ times a day. Alternating arm and leg raises 1. Get on your hands and knees on a firm surface. If you are on a hard floor, you may want to use padding, such as an exercise mat, to cushion your knees. 2. Line up your arms and legs. Your hands should be directly below your shoulders, and your knees should be directly below your hips. 3. Lift your left leg behind you. At the same time, raise your right arm and straighten it in front of you. ? Do not lift your leg higher than your hip. ? Do not lift your arm higher than your shoulder. ? Keep your abdominal and back muscles tight. ? Keep your hips facing the ground. ? Do not arch your back. ? Keep your balance carefully, and do not hold your breath. 4. Hold this position for __________ seconds. 5. Slowly return to the starting position. 6. Repeat with your right leg and your left arm. Repeat __________ times. Complete this exercise __________ times a day.   Abdominal set with straight leg raise 1. Lie on your back on a firm surface. 2. Bend one of your knees and keep your other leg straight. 3. Tense your abdominal muscles and lift your straight leg up, 4-6 inches (10-15 cm) off the ground. 4. Keep your abdominal muscles tight and hold this position for __________ seconds. ? Do not hold your breath. ? Do not arch your back. Keep it flat against the ground. 5. Keep your abdominal muscles tense as you slowly lower your leg back to the starting position. 6. Repeat with your other leg. Repeat __________ times. Complete this exercise __________ times a day.    Single leg lower with bent knees 1. Lie on your back on a firm surface. 2. Tense your abdominal muscles and lift your feet off the floor, one foot at a time, so your knees and hips are bent in 90-degree angles (right angles). ? Your knees should be over your hips and your lower legs should be parallel to the floor. 3. Keeping your abdominal muscles tense and your knee bent, slowly lower one of your legs so your toe touches the ground. 4. Lift your leg back up to return to the starting position. ? Do not hold your breath. ? Do not let your back arch. Keep your back flat against the ground. 5. Repeat with your other leg. Repeat __________ times. Complete this exercise __________ times a day.  Posture and body mechanics Good posture and healthy body mechanics can help to relieve stress in your body's tissues and joints. Body mechanics refers to the movements and positions of your body while you do your daily activities. Posture is part of body mechanics. Good posture means:  Your spine is in its natural S-curve position (neutral).  Your shoulders are pulled back slightly.  Your head is not tipped forward. Follow these guidelines to improve your posture and body mechanics in your everyday activities. Standing  When standing, keep your spine neutral and your feet about hip width apart. Keep a slight bend in your knees. Your ears, shoulders, and hips should line up.  When you do a task in which you stand in one place for a long time, place one foot up on a stable object that is 2-4 inches (5-10 cm) high, such as a footstool. This helps keep your spine neutral.   Sitting  When sitting, keep your spine neutral and keep your feet flat on the floor. Use a footrest, if necessary, and keep your thighs parallel to the floor. Avoid rounding your shoulders, and avoid tilting your head forward.  When working at a desk or a computer, keep your desk at a height where your hands are slightly lower than  your elbows. Slide your chair under your desk so you are close enough to maintain good posture.  When working at a computer, place your monitor at a height where you are looking straight ahead and you do not have to tilt your head forward or downward to look at the screen.   Resting  When lying down and resting, avoid positions that are most painful for you.  If you have pain with activities such as sitting, bending, stooping, or squatting, lie in a position in which your body does not bend very much. For example, avoid curling up on your side with your arms and knees near your chest (fetal position).  If you have pain with activities such as standing for a long time or reaching with your arms, lie with your spine in a neutral position and bend your knees slightly. Try the following positions: ? Lying on your side with a pillow between your knees. ? Lying on your back with a pillow under your knees. Lifting  When lifting objects, keep your feet at least shoulder width apart and tighten your abdominal muscles.  Bend your knees and hips and keep your spine neutral. It is important to lift using the strength of your legs, not your back. Do not lock your knees straight out.  Always ask for help to lift heavy or awkward objects.   This information is not intended to replace advice given to you by your health care provider. Make sure you discuss any questions you have with your health care provider. Document Revised: 03/07/2019 Document Reviewed: 12/05/2018 Elsevier Patient Education  [employer].

## 2021-01-08 ENCOUNTER — Encounter: Payer: Self-pay | Admitting: Nurse Practitioner

## 2021-01-08 NOTE — Progress Notes (Signed)
No fracture or misalignment of the lower spine. Pain appears to be muscular in nature and will take a while to totally heal. Begin gentle exercises on Monday.

## 2021-01-11 NOTE — Telephone Encounter (Signed)
Patient left vm asking for xray results. Given results above. No further questions.

## 2021-01-12 DIAGNOSIS — M25551 Pain in right hip: Secondary | ICD-10-CM | POA: Diagnosis not present

## 2021-01-12 DIAGNOSIS — M545 Low back pain, unspecified: Secondary | ICD-10-CM | POA: Diagnosis not present

## 2021-01-19 DIAGNOSIS — G4733 Obstructive sleep apnea (adult) (pediatric): Secondary | ICD-10-CM | POA: Diagnosis not present

## 2021-01-25 DIAGNOSIS — H0100B Unspecified blepharitis left eye, upper and lower eyelids: Secondary | ICD-10-CM | POA: Diagnosis not present

## 2021-01-25 DIAGNOSIS — H0100A Unspecified blepharitis right eye, upper and lower eyelids: Secondary | ICD-10-CM | POA: Diagnosis not present

## 2021-01-31 DIAGNOSIS — F319 Bipolar disorder, unspecified: Secondary | ICD-10-CM | POA: Diagnosis not present

## 2021-02-03 ENCOUNTER — Other Ambulatory Visit: Payer: Medicare Other

## 2021-02-08 ENCOUNTER — Ambulatory Visit: Payer: Medicare Other | Admitting: Endocrinology

## 2021-02-22 ENCOUNTER — Other Ambulatory Visit: Payer: Self-pay | Admitting: Endocrinology

## 2021-02-25 ENCOUNTER — Other Ambulatory Visit: Payer: Medicare Other

## 2021-02-28 ENCOUNTER — Other Ambulatory Visit: Payer: Self-pay | Admitting: *Deleted

## 2021-02-28 DIAGNOSIS — E1065 Type 1 diabetes mellitus with hyperglycemia: Secondary | ICD-10-CM

## 2021-02-28 MED ORDER — ROSUVASTATIN CALCIUM 5 MG PO TABS: 5.0000 mg | ORAL_TABLET | Freq: Every day | ORAL | 3 refills | Status: DC

## 2021-03-01 DIAGNOSIS — K3184 Gastroparesis: Secondary | ICD-10-CM | POA: Diagnosis not present

## 2021-03-01 DIAGNOSIS — E1065 Type 1 diabetes mellitus with hyperglycemia: Secondary | ICD-10-CM | POA: Diagnosis not present

## 2021-03-02 ENCOUNTER — Ambulatory Visit: Payer: Medicare Other | Admitting: Endocrinology

## 2021-03-16 ENCOUNTER — Other Ambulatory Visit: Payer: Self-pay | Admitting: Endocrinology

## 2021-04-11 DIAGNOSIS — F319 Bipolar disorder, unspecified: Secondary | ICD-10-CM | POA: Diagnosis not present

## 2021-05-19 DIAGNOSIS — F319 Bipolar disorder, unspecified: Secondary | ICD-10-CM | POA: Diagnosis not present

## 2021-05-24 DIAGNOSIS — F4310 Post-traumatic stress disorder, unspecified: Secondary | ICD-10-CM | POA: Diagnosis not present

## 2021-05-24 DIAGNOSIS — Z79899 Other long term (current) drug therapy: Secondary | ICD-10-CM | POA: Diagnosis not present

## 2021-05-24 DIAGNOSIS — F411 Generalized anxiety disorder: Secondary | ICD-10-CM | POA: Diagnosis not present

## 2021-05-24 DIAGNOSIS — F319 Bipolar disorder, unspecified: Secondary | ICD-10-CM | POA: Diagnosis not present

## 2021-05-24 DIAGNOSIS — F609 Personality disorder, unspecified: Secondary | ICD-10-CM | POA: Diagnosis not present

## 2021-05-25 ENCOUNTER — Other Ambulatory Visit: Payer: Self-pay

## 2021-05-25 ENCOUNTER — Encounter: Payer: Self-pay | Admitting: Physician Assistant

## 2021-05-25 ENCOUNTER — Ambulatory Visit (INDEPENDENT_AMBULATORY_CARE_PROVIDER_SITE_OTHER): Payer: Medicare Other | Admitting: Physician Assistant

## 2021-05-25 VITALS — BP 128/69 | HR 89 | Wt 146.0 lb

## 2021-05-25 DIAGNOSIS — R251 Tremor, unspecified: Secondary | ICD-10-CM

## 2021-05-25 DIAGNOSIS — H8112 Benign paroxysmal vertigo, left ear: Secondary | ICD-10-CM

## 2021-05-25 MED ORDER — MECLIZINE HCL 25 MG PO TABS: 25.0000 mg | ORAL_TABLET | Freq: Three times a day (TID) | ORAL | 0 refills | Status: DC | PRN

## 2021-05-25 MED ORDER — GABAPENTIN 300 MG PO CAPS: 300.0000 mg | ORAL_CAPSULE | Freq: Every day | ORAL | 3 refills | Status: DC

## 2021-05-25 NOTE — Patient Instructions (Addendum)
Essential Tremor A tremor is trembling or shaking that a person cannot control. Most tremors affect the hands or arms. Tremors can also affect the head, vocal cords, legs, and other parts of the body. Essential tremor is a tremor without a known cause. Usually, it occurs while a person is trying to perform an action. Ittends to get worse gradually as a person ages. What are the causes? The cause of this condition is not known. What increases the risk? You are more likely to develop this condition if: You have a family member with essential tremor. You are age 57 or older. You take certain medicines. What are the signs or symptoms? The main sign of a tremor is a rhythmic shaking of certain parts of your body that is uncontrolled and unintentional. You may: Have difficulty eating with a spoon or fork. Have difficulty writing. Nod your head up and down or side to side. Have a quivering voice. The shaking may: Get worse over time. Come and go. Be more noticeable on one side of your body. Get worse due to stress, fatigue, caffeine, and extreme heat or cold. How is this diagnosed? This condition may be diagnosed based on: Your symptoms and medical history. A physical exam. There is no single test to diagnose an essential tremor. However, your health care provider may order tests to rule out other causes of your condition. These may include: Blood and urine tests. Imaging studies of your brain, such as CT scan and MRI. A test that measures involuntary muscle movement (electromyogram). How is this treated? Treatment for essential tremor depends on the severity of the condition. Some tremors may go away without treatment. Mild tremors may not need treatment if they do not affect your day-to-day life. Severe tremors may need to be treated using one or more of the following options: Medicines. Lifestyle changes. Occupational or physical therapy. Follow these instructions at  home: Lifestyle  Do not use any products that contain nicotine or tobacco, such as cigarettes and e-cigarettes. If you need help quitting, ask your health care provider. Limit your caffeine intake as told by your health care provider. Try to get 8 hours of sleep each night. Find ways to manage your stress that fits your lifestyle and personality. Consider trying meditation or yoga. Try to anticipate stressful situations and allow extra time to manage them. If you are struggling emotionally with the effects of your tremor, consider working with a mental health provider.  General instructions Take over-the-counter and prescription medicines only as told by your health care provider. Avoid extreme heat and extreme cold. Keep all follow-up visits as told by your health care provider. This is important. Visits may include physical therapy visits. Contact a health care provider if: You experience any changes in the location or intensity of your tremors. You start having a tremor after starting a new medicine. You have tremor with other symptoms, such as: Numbness. Tingling. Pain. Weakness. Your tremor gets worse. Your tremor interferes with your daily life. You feel down, blue, or sad for at least 2 weeks in a row. Worrying about your tremor and what other people think about you interferes with your everyday life functions, including relationships, work, or school. Summary Essential tremor is a tremor without a known cause. Usually, it occurs when you are trying to perform an action. You are more likely to develop this condition if you have a family member with essential tremor. The main sign of a tremor is a rhythmic shaking of   certain parts of your body that is uncontrolled and unintentional. Treatment for essential tremor depends on the severity of the condition. This information is not intended to replace advice given to you by your health care provider. Make sure you discuss any  questions you have with your healthcare provider. Document Revised: 08/06/2020 Document Reviewed: 08/06/2020 Elsevier Patient Education  2022 [employer].   Vertigo Vertigo is the feeling that you or the things around you are moving when they are not. This feeling can come and go at any time. Vertigo often goes away on its own. This condition can be dangerous if it happens when you are doingactivities like driving or working with machines. Your doctor will do tests to find the cause of your vertigo. These tests willalso help your doctor decide on the best treatment for you. Follow these instructions at home: Eating and drinking     Drink enough fluid to keep your pee (urine) pale yellow. Do not drink alcohol. Activity Return to your normal activities when your doctor says that it is safe. In the morning, first sit up on the side of the bed. When you feel okay, stand slowly while you hold onto something until you know that your balance is fine. Move slowly. Avoid sudden body or head movements or certain positions, as told by your doctor. Use a cane if you have trouble standing or walking. Sit down right away if you feel dizzy. Avoid doing any tasks or activities that can cause danger to you or others if you get dizzy. Avoid bending down if you feel dizzy. Place items in your home so that they are easy for you to reach without bending or leaning over. Do not drive or use machinery if you feel dizzy. General instructions Take over-the-counter and prescription medicines only as told by your doctor. Keep all follow-up visits. Contact a doctor if: Your medicine does not help your vertigo. Your problems get worse or you have new symptoms. You have a fever. You feel like you may vomit (nauseous), or this feeling gets worse. You start to vomit. Your family or friends see changes in how you act. You lose feeling (have numbness) in part of your body. You feel prickling and tingling in a  part of your body. Get help right away if: You are always dizzy. You faint. You get very bad headaches. You get a stiff neck. Bright light starts to bother you. You have trouble moving or talking. You feel weak in your hands, arms, or legs. You have changes in your hearing or in how you see (vision). These symptoms may be an emergency. Get help right away. Call your local emergency services (911 in the [place] Do not wait to see if the symptoms will go away. Do not drive yourself to the hospital. Summary Vertigo is the feeling that you or the things around you are moving when they are not. Your doctor will do tests to find the cause of your vertigo. You may be told to avoid some tasks, positions, or movements. Contact a doctor if your medicine is not helping, or if you have a fever, new symptoms, or a change in how you act. Get help right away if you get very bad headaches, or if you have changes in how you speak, hear, or see. This information is not intended to replace advice given to you by your health care provider. Make sure you discuss any questions you have with your healthcare provider. Document Revised: 10/13/2020 Document  Reviewed: 10/13/2020 Elsevier Patient Education  2022 [employer].

## 2021-05-25 NOTE — Progress Notes (Signed)
Subjective:    Patient ID: Sheryl Matthews, female    DOB: [birth year], 55 y.o.   MRN: [id number]  HPI Pt is a 55 yo female with T1DM, Bipolar 1 who presents to the clinic with 3 weeks of dizziness and feeling like she is going to fall. She has fallen into walls and chairs but not to the ground. She does feel like room is spinning at times. Seems to be when she gets up or turns her head. She has ongoing sinus symptoms but nothing worsened. No fever, chills, headaches, speech changes, strength changes.   She has noticed a bilateral hand tremor at times when she goes to do things. This is new.    .. 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[place]) 12/22/2014   Pseudophakia of both eyes 12/22/2014   Abdominal pain 06/07/2015   Internal hemorrhoids 08/16/2015   Uncontrolled type 1 diabetes mellitus with hyperglycemia (HCC) 09/15/2015   HAV (hallux abducto valgus) 12/20/2015   Adductovarus rotation of toe, acquired 12/20/2015   Painful breasts 06/14/2016   History of fibrocystic disease of breast 06/14/2016   Left breast mass 07/12/2016   Fibromyalgia 12/26/2016   Vitamin D insufficiency 01/30/2017   Low iron stores 01/30/2017   SOB (shortness of breath) 02/02/2017   No energy 02/02/2017   Chest tightness 02/02/2017   Weakness 02/02/2017   RLS (restless legs syndrome) 12/18/2017   Chronic idiopathic constipation 12/24/2018   Posterior vitreous degeneration, bilateral 04/14/2019   Tinea manuum 05/05/2019   Toenail fungus 05/05/2019   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)    Deafness in left ear    Depression    Diabetes mellitus without complication ([place])    Gastroparesis    Osteoarthritis    Pain of left breast    Skin disorder    .Marland Kitchen Family History  Problem Relation Age of Onset   Depression Sister    Alcohol abuse Brother    Diabetes Brother    Depression Brother    Breast cancer Maternal Aunt     Review of Systems See HPI.     Objective:   Physical Exam Vitals reviewed.  Constitutional:      Appearance: Normal appearance.  HENT:     Head: Normocephalic.     Right Ear: Tympanic membrane, ear canal and external ear normal. There is no impacted cerumen.     Left Ear: Tympanic membrane, ear canal and external ear normal. There is no impacted cerumen.     Nose: Nose normal.     Mouth/Throat:     Mouth: Mucous membranes are moist.  Eyes:     Extraocular Movements: Extraocular movements intact.     Conjunctiva/sclera: Conjunctivae normal.     Pupils: Pupils are equal, round, and reactive to light.  Cardiovascular:     Rate and Rhythm: Normal rate and regular rhythm.  Pulmonary:  Effort: Pulmonary effort is normal.     Breath sounds: Normal breath sounds.  Neurological:     General: No focal deficit present.     Mental Status: She is alert and oriented to person, place, and time.     Comments: Dix hallpike positive for nystagmus to the left.   Psychiatric:        Mood and Affect: Mood normal.         Assessment & Plan:  Marland KitchenMarland KitchenBrooklynn was seen today for fall.  Diagnoses and all orders for this visit:  Benign paroxysmal positional vertigo, left -     meclizine (ANTIVERT) 25 MG tablet; Take 1 tablet (25 mg total) by mouth 3 (three) times daily as needed for dizziness.  Tremor of both hands -     gabapentin (NEURONTIN) 300 MG capsule; Take 1  capsule (300 mg total) by mouth daily. -     COMPLETE METABOLIC PANEL WITH GFR -     TSH -     Magnesium -     CBC with Differential/Platelet   Dizziness due to vertigo.  Start epley manuevers when over at visit. HO given.  Start antivert.  Consider PT if not having improvement.  No tremor seen today.  Labs ordered.  Increased gabapentin.  Follow up as needed or if symptoms change or worsen.  Recheck 2 weeks.

## 2021-05-26 LAB — COMPLETE METABOLIC PANEL WITH GFR
AG Ratio: 1.6 (calc) (ref 1.0–2.5)
ALT: 36 U/L — ABNORMAL HIGH (ref 6–29)
AST: 29 U/L (ref 10–35)
Albumin: 4.2 g/dL (ref 3.6–5.1)
Alkaline phosphatase (APISO): 95 U/L (ref 37–153)
BUN: 23 mg/dL (ref 7–25)
CO2: 32 mmol/L (ref 20–32)
Calcium: 9.5 mg/dL (ref 8.6–10.4)
Chloride: 103 mmol/L (ref 98–110)
Creat: 0.97 mg/dL (ref 0.50–1.05)
GFR, Est African American: 76 mL/min/{1.73_m2} (ref >=60)
GFR, Est Non African American: 65 mL/min/{1.73_m2} (ref >=60)
Globulin: 2.7 g/dL (calc) (ref 1.9–3.7)
Glucose, Bld: 71 mg/dL (ref 65–99)
Potassium: 4.4 mmol/L (ref 3.5–5.3)
Sodium: 141 mmol/L (ref 135–146)
Total Bilirubin: 0.3 mg/dL (ref 0.2–1.2)
Total Protein: 6.9 g/dL (ref 6.1–8.1)

## 2021-05-26 LAB — CBC WITH DIFFERENTIAL/PLATELET
Absolute Monocytes: 539 cells/uL (ref 200–950)
Basophils Absolute: 50 cells/uL (ref 0–200)
Basophils Relative: 0.9 %
Eosinophils Absolute: 198 cells/uL (ref 15–500)
Eosinophils Relative: 3.6 %
HCT: 42.9 % (ref 35.0–45.0)
Hemoglobin: 13.8 g/dL (ref 11.7–15.5)
Lymphs Abs: 1447 cells/uL (ref 850–3900)
MCH: 30.1 pg (ref 27.0–33.0)
MCHC: 32.2 g/dL (ref 32.0–36.0)
MCV: 93.7 fL (ref 80.0–100.0)
MPV: 11.6 fL (ref 7.5–12.5)
Monocytes Relative: 9.8 %
Neutro Abs: 3267 cells/uL (ref 1500–7800)
Neutrophils Relative %: 59.4 %
Platelets: 301 10*3/uL (ref 140–400)
RBC: 4.58 10*6/uL (ref 3.80–5.10)
RDW: 12.2 % (ref 11.0–15.0)
Total Lymphocyte: 26.3 %
WBC: 5.5 10*3/uL (ref 3.8–10.8)

## 2021-05-26 LAB — MAGNESIUM: Magnesium: 2.2 mg/dL (ref 1.5–2.5)

## 2021-05-26 LAB — TSH: TSH: 1.83 mIU/L (ref 0.40–4.50)

## 2021-05-26 NOTE — Progress Notes (Signed)
Sheryl Matthews,   Hemoglobin good.  Magnesium normal.  Thyroid normal.  Electrolytes great.  One liver enzyme up a littl.

## 2021-06-03 DIAGNOSIS — F319 Bipolar disorder, unspecified: Secondary | ICD-10-CM | POA: Diagnosis not present

## 2021-06-06 DIAGNOSIS — Z794 Long term (current) use of insulin: Secondary | ICD-10-CM | POA: Diagnosis not present

## 2021-06-06 DIAGNOSIS — F329 Major depressive disorder, single episode, unspecified: Secondary | ICD-10-CM | POA: Diagnosis not present

## 2021-06-06 DIAGNOSIS — F4310 Post-traumatic stress disorder, unspecified: Secondary | ICD-10-CM | POA: Diagnosis not present

## 2021-06-06 DIAGNOSIS — E118 Type 2 diabetes mellitus with unspecified complications: Secondary | ICD-10-CM | POA: Diagnosis not present

## 2021-06-06 DIAGNOSIS — F332 Major depressive disorder, recurrent severe without psychotic features: Secondary | ICD-10-CM | POA: Diagnosis not present

## 2021-06-06 DIAGNOSIS — F411 Generalized anxiety disorder: Secondary | ICD-10-CM | POA: Diagnosis not present

## 2021-06-07 ENCOUNTER — Encounter: Payer: Self-pay | Admitting: Physician Assistant

## 2021-06-08 DIAGNOSIS — H6123 Impacted cerumen, bilateral: Secondary | ICD-10-CM | POA: Diagnosis not present

## 2021-06-08 DIAGNOSIS — H838X3 Other specified diseases of inner ear, bilateral: Secondary | ICD-10-CM | POA: Diagnosis not present

## 2021-06-08 DIAGNOSIS — H9012 Conductive hearing loss, unilateral, left ear, with unrestricted hearing on the contralateral side: Secondary | ICD-10-CM | POA: Diagnosis not present

## 2021-06-08 DIAGNOSIS — H90A32 Mixed conductive and sensorineural hearing loss, unilateral, left ear with restricted hearing on the contralateral side: Secondary | ICD-10-CM | POA: Diagnosis not present

## 2021-06-08 DIAGNOSIS — H7292 Unspecified perforation of tympanic membrane, left ear: Secondary | ICD-10-CM | POA: Diagnosis not present

## 2021-06-10 DIAGNOSIS — F332 Major depressive disorder, recurrent severe without psychotic features: Secondary | ICD-10-CM | POA: Diagnosis not present

## 2021-06-10 DIAGNOSIS — F329 Major depressive disorder, single episode, unspecified: Secondary | ICD-10-CM | POA: Diagnosis not present

## 2021-06-10 DIAGNOSIS — Z794 Long term (current) use of insulin: Secondary | ICD-10-CM | POA: Diagnosis not present

## 2021-06-10 DIAGNOSIS — F411 Generalized anxiety disorder: Secondary | ICD-10-CM | POA: Diagnosis not present

## 2021-06-10 DIAGNOSIS — E1165 Type 2 diabetes mellitus with hyperglycemia: Secondary | ICD-10-CM | POA: Diagnosis not present

## 2021-06-10 DIAGNOSIS — Z9889 Other specified postprocedural states: Secondary | ICD-10-CM | POA: Diagnosis not present

## 2021-06-10 DIAGNOSIS — F4310 Post-traumatic stress disorder, unspecified: Secondary | ICD-10-CM | POA: Diagnosis not present

## 2021-06-13 DIAGNOSIS — Z794 Long term (current) use of insulin: Secondary | ICD-10-CM | POA: Diagnosis not present

## 2021-06-13 DIAGNOSIS — F411 Generalized anxiety disorder: Secondary | ICD-10-CM | POA: Diagnosis not present

## 2021-06-13 DIAGNOSIS — F4310 Post-traumatic stress disorder, unspecified: Secondary | ICD-10-CM | POA: Diagnosis not present

## 2021-06-13 DIAGNOSIS — E119 Type 2 diabetes mellitus without complications: Secondary | ICD-10-CM | POA: Diagnosis not present

## 2021-06-13 DIAGNOSIS — F332 Major depressive disorder, recurrent severe without psychotic features: Secondary | ICD-10-CM | POA: Diagnosis not present

## 2021-06-13 DIAGNOSIS — F329 Major depressive disorder, single episode, unspecified: Secondary | ICD-10-CM | POA: Diagnosis not present

## 2021-06-15 DIAGNOSIS — F332 Major depressive disorder, recurrent severe without psychotic features: Secondary | ICD-10-CM | POA: Diagnosis not present

## 2021-06-15 DIAGNOSIS — Z794 Long term (current) use of insulin: Secondary | ICD-10-CM | POA: Diagnosis not present

## 2021-06-15 DIAGNOSIS — F4310 Post-traumatic stress disorder, unspecified: Secondary | ICD-10-CM | POA: Diagnosis not present

## 2021-06-15 DIAGNOSIS — F411 Generalized anxiety disorder: Secondary | ICD-10-CM | POA: Diagnosis not present

## 2021-06-15 DIAGNOSIS — F329 Major depressive disorder, single episode, unspecified: Secondary | ICD-10-CM | POA: Diagnosis not present

## 2021-06-15 DIAGNOSIS — E1165 Type 2 diabetes mellitus with hyperglycemia: Secondary | ICD-10-CM | POA: Diagnosis not present

## 2021-06-20 DIAGNOSIS — F329 Major depressive disorder, single episode, unspecified: Secondary | ICD-10-CM | POA: Diagnosis not present

## 2021-06-20 DIAGNOSIS — F609 Personality disorder, unspecified: Secondary | ICD-10-CM | POA: Diagnosis not present

## 2021-06-20 DIAGNOSIS — F4310 Post-traumatic stress disorder, unspecified: Secondary | ICD-10-CM | POA: Diagnosis not present

## 2021-06-20 DIAGNOSIS — Z794 Long term (current) use of insulin: Secondary | ICD-10-CM | POA: Diagnosis not present

## 2021-06-20 DIAGNOSIS — E119 Type 2 diabetes mellitus without complications: Secondary | ICD-10-CM | POA: Diagnosis not present

## 2021-06-20 DIAGNOSIS — F339 Major depressive disorder, recurrent, unspecified: Secondary | ICD-10-CM | POA: Diagnosis not present

## 2021-06-20 DIAGNOSIS — F411 Generalized anxiety disorder: Secondary | ICD-10-CM | POA: Diagnosis not present

## 2021-06-20 DIAGNOSIS — F332 Major depressive disorder, recurrent severe without psychotic features: Secondary | ICD-10-CM | POA: Diagnosis not present

## 2021-06-22 DIAGNOSIS — F411 Generalized anxiety disorder: Secondary | ICD-10-CM | POA: Diagnosis not present

## 2021-06-22 DIAGNOSIS — F329 Major depressive disorder, single episode, unspecified: Secondary | ICD-10-CM | POA: Diagnosis not present

## 2021-06-22 DIAGNOSIS — F332 Major depressive disorder, recurrent severe without psychotic features: Secondary | ICD-10-CM | POA: Diagnosis not present

## 2021-06-22 DIAGNOSIS — F4310 Post-traumatic stress disorder, unspecified: Secondary | ICD-10-CM | POA: Diagnosis not present

## 2021-06-22 DIAGNOSIS — E1165 Type 2 diabetes mellitus with hyperglycemia: Secondary | ICD-10-CM | POA: Diagnosis not present

## 2021-06-22 DIAGNOSIS — Z794 Long term (current) use of insulin: Secondary | ICD-10-CM | POA: Diagnosis not present

## 2021-06-24 DIAGNOSIS — F329 Major depressive disorder, single episode, unspecified: Secondary | ICD-10-CM | POA: Diagnosis not present

## 2021-06-24 DIAGNOSIS — F332 Major depressive disorder, recurrent severe without psychotic features: Secondary | ICD-10-CM | POA: Diagnosis not present

## 2021-06-24 DIAGNOSIS — Z79899 Other long term (current) drug therapy: Secondary | ICD-10-CM | POA: Diagnosis not present

## 2021-06-24 DIAGNOSIS — F319 Bipolar disorder, unspecified: Secondary | ICD-10-CM | POA: Diagnosis not present

## 2021-06-27 DIAGNOSIS — Z9889 Other specified postprocedural states: Secondary | ICD-10-CM | POA: Diagnosis not present

## 2021-06-27 DIAGNOSIS — E119 Type 2 diabetes mellitus without complications: Secondary | ICD-10-CM | POA: Diagnosis not present

## 2021-06-27 DIAGNOSIS — F332 Major depressive disorder, recurrent severe without psychotic features: Secondary | ICD-10-CM | POA: Diagnosis not present

## 2021-06-27 DIAGNOSIS — F329 Major depressive disorder, single episode, unspecified: Secondary | ICD-10-CM | POA: Diagnosis not present

## 2021-06-29 DIAGNOSIS — F332 Major depressive disorder, recurrent severe without psychotic features: Secondary | ICD-10-CM | POA: Diagnosis not present

## 2021-06-29 DIAGNOSIS — Z79899 Other long term (current) drug therapy: Secondary | ICD-10-CM | POA: Diagnosis not present

## 2021-06-29 DIAGNOSIS — Z9889 Other specified postprocedural states: Secondary | ICD-10-CM | POA: Diagnosis not present

## 2021-06-29 DIAGNOSIS — F329 Major depressive disorder, single episode, unspecified: Secondary | ICD-10-CM | POA: Diagnosis not present

## 2021-07-05 DIAGNOSIS — F4310 Post-traumatic stress disorder, unspecified: Secondary | ICD-10-CM | POA: Diagnosis not present

## 2021-07-05 DIAGNOSIS — F609 Personality disorder, unspecified: Secondary | ICD-10-CM | POA: Diagnosis not present

## 2021-07-05 DIAGNOSIS — Z79899 Other long term (current) drug therapy: Secondary | ICD-10-CM | POA: Diagnosis not present

## 2021-07-05 DIAGNOSIS — F411 Generalized anxiety disorder: Secondary | ICD-10-CM | POA: Diagnosis not present

## 2021-07-05 DIAGNOSIS — F319 Bipolar disorder, unspecified: Secondary | ICD-10-CM | POA: Diagnosis not present

## 2021-07-06 ENCOUNTER — Ambulatory Visit (INDEPENDENT_AMBULATORY_CARE_PROVIDER_SITE_OTHER): Payer: Medicare Other | Admitting: Physician Assistant

## 2021-07-06 VITALS — BP 112/65 | HR 88 | Temp 97.7°F | Ht 62.0 in | Wt 142.0 lb

## 2021-07-06 DIAGNOSIS — N3001 Acute cystitis with hematuria: Secondary | ICD-10-CM

## 2021-07-06 DIAGNOSIS — J0140 Acute pansinusitis, unspecified: Secondary | ICD-10-CM

## 2021-07-06 LAB — POCT URINALYSIS DIP (CLINITEK)
Bilirubin, UA: NEGATIVE
Glucose, UA: >=1000 mg/dL — AB
Ketones, POC UA: NEGATIVE mg/dL
Nitrite, UA: POSITIVE — AB
Spec Grav, UA: 1.025 (ref 1.010–1.025)
Urobilinogen, UA: 1 E.U./dL
pH, UA: 6.5 (ref 5.0–8.0)

## 2021-07-06 MED ORDER — FLUCONAZOLE 150 MG PO TABS: 150.0000 mg | ORAL_TABLET | Freq: Once | ORAL | 0 refills | Status: AC

## 2021-07-06 MED ORDER — AMOXICILLIN-POT CLAVULANATE 875-125 MG PO TABS: 1.0000 | ORAL_TABLET | Freq: Two times a day (BID) | ORAL | 0 refills | Status: AC

## 2021-07-06 NOTE — Patient Instructions (Signed)
Urinary Tract Infection, Adult A urinary tract infection (UTI) is an infection of any part of the urinary tract. The urinary tract includes the kidneys, ureters, bladder, and urethra. These organs make, store, and get rid of urine in the body. An upper UTI affects the ureters and kidneys. A lower UTI affects the bladder and urethra. What are the causes? Most urinary tract infections are caused by bacteria in your genital area around your urethra, where urine leaves your body. These bacteria grow and cause inflammation of your urinary tract. What increases the risk? You are more likely to develop this condition if: You have a urinary catheter that stays in place. You are not able to control when you urinate or have a bowel movement (incontinence). You are female and you: Use a spermicide or diaphragm for birth control. Have low estrogen levels. Are pregnant. You have certain genes that increase your risk. You are sexually active. You take antibiotic medicines. You have a condition that causes your flow of urine to slow down, such as: An enlarged prostate, if you are female. Blockage in your urethra. A kidney stone. A nerve condition that affects your bladder control (neurogenic bladder). Not getting enough to drink, or not urinating often. You have certain medical conditions, such as: Diabetes. A weak disease-fighting system (immunesystem). Sickle cell disease. Gout. Spinal cord injury. What are the signs or symptoms? Symptoms of this condition include: Needing to urinate right away (urgency). Frequent urination. This may include small amounts of urine each time you urinate. Pain or burning with urination. Blood in the urine. Urine that smells bad or unusual. Trouble urinating. Cloudy urine. Vaginal discharge, if you are female. Pain in the abdomen or the lower back. You may also have: Vomiting or a decreased appetite. Confusion. Irritability or tiredness. A fever or  chills. Diarrhea. The first symptom in older adults may be confusion. In some cases, they may not have any symptoms until the infection has worsened. How is this diagnosed? This condition is diagnosed based on your medical history and a physical exam. You may also have other tests, including: Urine tests. Blood tests. Tests for STIs (sexually transmitted infections). If you have had more than one UTI, a cystoscopy or imaging studies may be done to determine the cause of the infections. How is this treated? Treatment for this condition includes: Antibiotic medicine. Over-the-counter medicines to treat discomfort. Drinking enough water to stay hydrated. If you have frequent infections or have other conditions such as a kidney stone, you may need to see a health care provider who specializes in the urinary tract (urologist). In rare cases, urinary tract infections can cause sepsis. Sepsis is a life-threatening condition that occurs when the body responds to an infection. Sepsis is treated in the hospital with IV antibiotics, fluids, and other medicines. Follow these instructions at home: Medicines Take over-the-counter and prescription medicines only as told by your health care provider. If you were prescribed an antibiotic medicine, take it as told by your health care provider. Do not stop using the antibiotic even if you start to feel better. General instructions Make sure you: Empty your bladder often and completely. Do not hold urine for long periods of time. Empty your bladder after sex. Wipe from front to back after urinating or having a bowel movement if you are female. Use each tissue only one time when you wipe. Drink enough fluid to keep your urine pale yellow. Keep all follow-up visits. This is important. Contact a health care provider   if: Your symptoms do not get better after 1-2 days. Your symptoms go away and then return. Get help right away if: You have severe pain in your  back or your lower abdomen. You have a fever or chills. You have nausea or vomiting. Summary A urinary tract infection (UTI) is an infection of any part of the urinary tract, which includes the kidneys, ureters, bladder, and urethra. Most urinary tract infections are caused by bacteria in your genital area. Treatment for this condition often includes antibiotic medicines. If you were prescribed an antibiotic medicine, take it as told by your health care provider. Do not stop using the antibiotic even if you start to feel better. Keep all follow-up visits. This is important. This information is not intended to replace advice given to you by your health care provider. Make sure you discuss any questions you have with your health care provider. Document Revised: 06/25/2020 Document Reviewed: 06/25/2020 Elsevier Patient Education  2022 [employer] Inc[employer]  

## 2021-07-06 NOTE — Progress Notes (Signed)
Subjective:    Patient ID: Sheryl Matthews, female    DOB: Dec 25, 1965, 55 y.o.   MRN: [id number]  HPI Pt is a 55 yo female with T1DM, Bipolar 1 with Major Depression who presents to the clinic with urinary symptoms for the last 3 days. She has also had some sinus symptoms with pressure for last few weeks after a cold. She has dysuria and urinary frequency. She has some pressure with urination. Not taken any medication. Denies fever, chills, flank pain, nausea, vomiting, diarrhea. Some minimal lower discomfort.   .. 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[place]) 12/22/2014   Pseudophakia of both eyes 12/22/2014   Abdominal pain 06/07/2015   Internal hemorrhoids 08/16/2015   Uncontrolled type 1 diabetes mellitus with hyperglycemia (HCC) 09/15/2015   HAV (hallux abducto valgus) 12/20/2015   Adductovarus rotation of toe, acquired 12/20/2015   Painful breasts 06/14/2016   History of fibrocystic disease of breast 06/14/2016   Left breast mass 07/12/2016   Fibromyalgia 12/26/2016   Vitamin D insufficiency 01/30/2017   Low iron stores 01/30/2017   SOB (shortness of breath) 02/02/2017   No energy 02/02/2017   Chest tightness 02/02/2017   Weakness 02/02/2017   RLS (restless legs syndrome) 12/18/2017   Chronic idiopathic constipation 12/24/2018   Posterior vitreous degeneration, bilateral 04/14/2019   Tinea manuum 05/05/2019   Toenail fungus 05/05/2019   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   OAB (overactive bladder) 08/12/2019   Esophageal dysphagia 11/10/2019   Nausea and vomiting 11/10/2019   Mid back pain on right side 01/05/2021   Fall 01/05/2021   Neck pain 01/05/2021   Tremor of both hands 05/25/2021   Benign paroxysmal positional vertigo, left 05/25/2021   Resolved Ambulatory Problems    Diagnosis Date Noted   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     Objective:   Physical Exam Vitals reviewed.  Constitutional:      Appearance: Normal appearance.  HENT:     Head: Normocephalic.     Comments: Tenderness over maxillary sinus to palpation.     Right Ear: Tympanic membrane, ear canal and external ear normal. There is no impacted cerumen.     Left Ear: Tympanic membrane, ear canal and external ear normal. There is no impacted cerumen.     Nose: Nose normal.     Mouth/Throat:     Mouth: Mucous membranes are moist.  Eyes:     Extraocular Movements: Extraocular movements intact.     Conjunctiva/sclera: Conjunctivae normal.     Pupils: Pupils are equal, round, and reactive to light.  Cardiovascular:     Rate and Rhythm: Normal rate and regular rhythm.     Pulses: Normal pulses.     Heart sounds: Normal heart sounds.  Pulmonary:     Effort: Pulmonary effort is normal.     Breath sounds: Normal breath sounds.  Abdominal:     General: Bowel sounds are normal. There is no distension.     Palpations: Abdomen  is soft.     Tenderness: There is abdominal tenderness. There is no right CVA tenderness, left CVA tenderness or guarding.     Comments: Lower abdominal tenderness but no guarding or rebound.   Musculoskeletal:     Cervical back: Normal range of motion.  Lymphadenopathy:     Cervical: No cervical adenopathy.  Neurological:     General: No focal deficit present.     Mental Status: She is alert and oriented to person, place, and time.  Psychiatric:        Mood and Affect: Mood normal.         .. Results for orders placed or performed in visit on 07/06/21  Urine Culture   Specimen: Urine  Result Value Ref Range   MICRO  NUMBER: [id number]    SPECIMEN QUALITY: Adequate    Sample Source NOT GIVEN    STATUS: FINAL    ISOLATE 1: ESBL Escherichia coli (A)       Susceptibility   Esbl escherichia coli - URINE CULTURE, REFLEX    AMOX/CLAVULANIC 16 Intermediate     AMPICILLIN* >=32 Resistant      * Extended spectrum beta-lactamase (ESBL) producing organisms demonstrate decreased activity with penicillins, cephalosporins and aztreonam.     AMPICILLIN/SULBACTAM >=32 Resistant     CEFAZOLIN >=64 Resistant     CEFTAZIDIME 4 Resistant     CEFEPIME 2 Resistant     CEFTRIAXONE >=64 Resistant     CIPROFLOXACIN >=4 Resistant     LEVOFLOXACIN >=8 Resistant     GENTAMICIN >=16 Resistant     IMIPENEM <=0.25 Sensitive     NITROFURANTOIN 128 Resistant     PIP/TAZO <=4 Sensitive     TOBRAMYCIN >=16 Resistant     TRIMETH/SULFA* <=20 Sensitive      * Extended spectrum beta-lactamase (ESBL) producing organisms demonstrate decreased activity with penicillins, cephalosporins and aztreonam. Legend: S = Susceptible  I = Intermediate R = Resistant  NS = Not susceptible * = Not tested  NR = Not reported **NN = See antimicrobic comments   POCT URINALYSIS DIP (CLINITEK)  Result Value Ref Range   Color, UA yellow yellow   Clarity, UA clear clear   Glucose, UA >=1,000 (A) negative mg/dL   Bilirubin, UA negative negative   Ketones, POC UA negative negative mg/dL   Spec Grav, UA 1.025 1.010 - 1.025   Blood, UA trace-lysed (A) negative   pH, UA 6.5 5.0 - 8.0   POC PROTEIN,UA trace negative, trace   Urobilinogen, UA 1.0 0.2 or 1.0 E.U./dL   Nitrite, UA Positive (A) Negative   Leukocytes, UA Trace (A) Negative    Assessment & Plan:   Marland KitchenMarland KitchenKaitlynn was seen today for urinary tract infection.  Diagnoses and all orders for this visit:  Acute cystitis with hematuria -     amoxicillin-clavulanate (AUGMENTIN) 875-125 MG tablet; Take 1 tablet by mouth 2 (two) times daily for 10 days. -     POCT URINALYSIS DIP (CLINITEK) -      Urine Culture  Acute non-recurrent pansinusitis -     amoxicillin-clavulanate (AUGMENTIN) 875-125 MG tablet; Take 1 tablet by mouth 2 (two) times daily for 10 days.  Other orders -     fluconazole (DIFLUCAN) 150 MG tablet; Take 1 tablet (150 mg total) by mouth once for 1 dose. Repeat x2 in 24-48 hours if symptoms persist.  UA positive for nitrites/blood/protein.  Treated with Augmentin.  Will send for culture.  Diflucan send for yeast after abx  use.  Continue to drink plenty of water.  Follow up as needed or if any red flag symptoms develop.

## 2021-07-07 DIAGNOSIS — F319 Bipolar disorder, unspecified: Secondary | ICD-10-CM | POA: Diagnosis not present

## 2021-07-09 LAB — URINE CULTURE
MICRO NUMBER:: 12229099
SPECIMEN QUALITY:: ADEQUATE

## 2021-07-11 ENCOUNTER — Other Ambulatory Visit: Payer: Self-pay | Admitting: Physician Assistant

## 2021-07-11 ENCOUNTER — Encounter: Payer: Self-pay | Admitting: Physician Assistant

## 2021-07-11 DIAGNOSIS — F4310 Post-traumatic stress disorder, unspecified: Secondary | ICD-10-CM | POA: Diagnosis not present

## 2021-07-11 DIAGNOSIS — Z79899 Other long term (current) drug therapy: Secondary | ICD-10-CM | POA: Diagnosis not present

## 2021-07-11 DIAGNOSIS — F332 Major depressive disorder, recurrent severe without psychotic features: Secondary | ICD-10-CM | POA: Diagnosis not present

## 2021-07-11 DIAGNOSIS — F329 Major depressive disorder, single episode, unspecified: Secondary | ICD-10-CM | POA: Diagnosis not present

## 2021-07-11 DIAGNOSIS — F411 Generalized anxiety disorder: Secondary | ICD-10-CM | POA: Diagnosis not present

## 2021-07-11 MED ORDER — SULFAMETHOXAZOLE-TRIMETHOPRIM 800-160 MG PO TABS: 1.0000 | ORAL_TABLET | Freq: Two times a day (BID) | ORAL | 0 refills | Status: DC

## 2021-07-11 NOTE — Progress Notes (Signed)
E.coli is resistant to many antibiotics. Stop augmentin. Start bactrim. I will send to pharmacy.

## 2021-07-12 ENCOUNTER — Telehealth: Payer: Self-pay | Admitting: Neurology

## 2021-07-12 NOTE — Telephone Encounter (Signed)
Patient called in stating since [place] changed her antibiotic that her sugars are dropping.   "E.coli is resistant to many antibiotics. Stop augmentin. Start bactrim. I will send to pharmacy."   Patient started the Bactrim yesterday. Sugars dropped to the 40's three different times yesterday. She wants to change to something that won't affect her sugars as much, whether they are too high or low. I did inform the patient that it was resistant to a lot of antibiotics. No antibiotic allergies in patient. Please advise.     Esbl escherichia coli    URINE CULTURE, REFLEX    AMOX/CLAVULANIC 16  Intermediate    AMPICILLIN >=32  Resistant 1    AMPICILLIN/SULBACTAM >=32  Resistant    CEFAZOLIN >=64  Resistant    CEFEPIME 2  Resistant    CEFTAZIDIME 4  Resistant    CEFTRIAXONE >=64  Resistant    CIPROFLOXACIN >=4  Resistant    GENTAMICIN >=16  Resistant    IMIPENEM <=0.25  Sensitive    LEVOFLOXACIN >=8  Resistant    NITROFURANTOIN 128  Resistant    PIP/TAZO <=4  Sensitive    TOBRAMYCIN >=16  Resistant    TRIMETH/SULFA <=20  Sensitive 2

## 2021-07-12 NOTE — Telephone Encounter (Signed)
I would continue with current antibiotic and have her cut her Lantus dose in half.  If it still low then she can hold the Lantus completely and try to make sure she is getting protein in her diet at least 3 times a day to help maintain glucose regulation.

## 2021-07-12 NOTE — Telephone Encounter (Signed)
Patient made aware. Will let us know if any issues.

## 2021-07-13 DIAGNOSIS — E1165 Type 2 diabetes mellitus with hyperglycemia: Secondary | ICD-10-CM | POA: Diagnosis not present

## 2021-07-13 DIAGNOSIS — F4310 Post-traumatic stress disorder, unspecified: Secondary | ICD-10-CM | POA: Diagnosis not present

## 2021-07-13 DIAGNOSIS — F332 Major depressive disorder, recurrent severe without psychotic features: Secondary | ICD-10-CM | POA: Diagnosis not present

## 2021-07-13 DIAGNOSIS — F329 Major depressive disorder, single episode, unspecified: Secondary | ICD-10-CM | POA: Diagnosis not present

## 2021-07-13 DIAGNOSIS — F319 Bipolar disorder, unspecified: Secondary | ICD-10-CM | POA: Diagnosis not present

## 2021-07-13 DIAGNOSIS — F411 Generalized anxiety disorder: Secondary | ICD-10-CM | POA: Diagnosis not present

## 2021-07-14 ENCOUNTER — Other Ambulatory Visit: Payer: Self-pay | Admitting: Neurology

## 2021-07-14 MED ORDER — CONTOUR NEXT TEST VI STRP: ORAL_STRIP | 0 refills | Status: DC

## 2021-07-20 ENCOUNTER — Telehealth: Payer: Self-pay | Admitting: Neurology

## 2021-07-20 ENCOUNTER — Other Ambulatory Visit: Payer: Self-pay

## 2021-07-20 DIAGNOSIS — E1065 Type 1 diabetes mellitus with hyperglycemia: Secondary | ICD-10-CM

## 2021-07-20 MED ORDER — FREESTYLE SYSTEM KIT: 1.0000 | PACK | 0 refills | Status: AC | PRN

## 2021-07-20 MED ORDER — FREESTYLE TEST VI STRP: ORAL_STRIP | 5 refills | Status: AC

## 2021-07-20 MED ORDER — FREESTYLE TEST VI STRP: ORAL_STRIP | 5 refills | Status: DC

## 2021-07-20 NOTE — Telephone Encounter (Signed)
Needs prescription for Freestyle meter and strips, insurance only covers this now. Ordered.

## 2021-07-28 DIAGNOSIS — F319 Bipolar disorder, unspecified: Secondary | ICD-10-CM | POA: Diagnosis not present

## 2021-08-02 ENCOUNTER — Ambulatory Visit: Payer: Medicare Other | Admitting: Physician Assistant

## 2021-08-02 DIAGNOSIS — F411 Generalized anxiety disorder: Secondary | ICD-10-CM | POA: Diagnosis not present

## 2021-08-02 DIAGNOSIS — Z79899 Other long term (current) drug therapy: Secondary | ICD-10-CM | POA: Diagnosis not present

## 2021-08-02 DIAGNOSIS — F609 Personality disorder, unspecified: Secondary | ICD-10-CM | POA: Diagnosis not present

## 2021-08-02 DIAGNOSIS — F319 Bipolar disorder, unspecified: Secondary | ICD-10-CM | POA: Diagnosis not present

## 2021-08-02 DIAGNOSIS — F4310 Post-traumatic stress disorder, unspecified: Secondary | ICD-10-CM | POA: Diagnosis not present

## 2021-08-03 ENCOUNTER — Encounter: Payer: Self-pay | Admitting: Medical-Surgical

## 2021-08-03 ENCOUNTER — Ambulatory Visit (INDEPENDENT_AMBULATORY_CARE_PROVIDER_SITE_OTHER): Payer: TRICARE For Life (TFL) | Admitting: Medical-Surgical

## 2021-08-03 VITALS — BP 118/77 | HR 91 | Resp 20 | Ht 62.0 in | Wt 145.1 lb

## 2021-08-03 DIAGNOSIS — R251 Tremor, unspecified: Secondary | ICD-10-CM

## 2021-08-03 DIAGNOSIS — R350 Frequency of micturition: Secondary | ICD-10-CM

## 2021-08-03 DIAGNOSIS — N898 Other specified noninflammatory disorders of vagina: Secondary | ICD-10-CM

## 2021-08-03 LAB — POCT URINALYSIS DIP (CLINITEK)
Bilirubin, UA: NEGATIVE
Blood, UA: NEGATIVE
Glucose, UA: 500 mg/dL — AB
Ketones, POC UA: NEGATIVE mg/dL
Leukocytes, UA: NEGATIVE
Nitrite, UA: NEGATIVE
POC PROTEIN,UA: NEGATIVE
Spec Grav, UA: 1.025 (ref 1.010–1.025)
Urobilinogen, UA: 1 E.U./dL
pH, UA: 6.5 (ref 5.0–8.0)

## 2021-08-03 LAB — WET PREP FOR TRICH, YEAST, CLUE
MICRO NUMBER:: 12341348
Specimen Quality: ADEQUATE

## 2021-08-03 MED ORDER — GABAPENTIN 300 MG PO CAPS: 300.0000 mg | ORAL_CAPSULE | Freq: Every day | ORAL | 3 refills | Status: AC

## 2021-08-03 MED ORDER — FLUTICASONE PROPIONATE 50 MCG/ACT NA SUSP: 1.0000 | Freq: Every day | NASAL | 6 refills | Status: DC

## 2021-08-03 NOTE — Progress Notes (Signed)
  HPI with pertinent ROS:   CC: UTI follow up  HPI: Is a 55 year old female presenting today for follow-up on UTI.  She was recently tested and found to be positive for ESBL E. coli in her urine.  It was showing sensitive to Bactrim and she was placed on a 7-day course of this medication.  Today she presents with complaints of urinary frequency, vaginal irritation, and small amounts of yellowish-brown vaginal discharge.  She is not currently sexually active and has no concerns for STI at this point.  Feels like her urine has a strange odor to it but no fever, chills, nausea, vomiting, flank pain, abdominal pain, burning with urination, urgency, or hematuria.  I reviewed the past medical history, family history, social history, surgical history, and allergies today and no changes were needed.  Please see the problem list section below in epic for further details.   Physical exam:   General: Well Developed, well nourished, and in no acute distress.  Neuro: Alert and oriented x3.  HEENT: Normocephalic, atraumatic.  Skin: Warm and dry. Cardiac: Regular rate and rhythm, no murmurs rubs or gallops, no lower extremity edema.  Respiratory: Clear to auscultation bilaterally. Not using accessory muscles, speaking in full sentences.  Impression and Recommendations:    1. Urinary frequency 2. Vaginal irritation 3. Vaginal discharge POCT UA negative for nitrites, leukocytes, protein, and blood but continues to be positive for glucose.  Sending for culture.  Sending wet prep to evaluate for yeast or BV. - POCT URINALYSIS DIP (CLINITEK) - Urine Culture - WET PREP FOR TRICH, YEAST, CLUE  4. Tremor of both hands Refilling gabapentin per patient request. - gabapentin (NEURONTIN) 300 MG capsule; Take 1 capsule (300 mg total) by mouth daily.  Dispense: 90 capsule; Refill: 3  Return if symptoms worsen or fail to improve. ___________________________________________ Clearnce Sorrel, DNP, APRN,  FNP-BC [facility] [place]

## 2021-08-05 DIAGNOSIS — F411 Generalized anxiety disorder: Secondary | ICD-10-CM | POA: Diagnosis not present

## 2021-08-05 DIAGNOSIS — F332 Major depressive disorder, recurrent severe without psychotic features: Secondary | ICD-10-CM | POA: Diagnosis not present

## 2021-08-05 DIAGNOSIS — F329 Major depressive disorder, single episode, unspecified: Secondary | ICD-10-CM | POA: Diagnosis not present

## 2021-08-05 DIAGNOSIS — F4310 Post-traumatic stress disorder, unspecified: Secondary | ICD-10-CM | POA: Diagnosis not present

## 2021-08-05 DIAGNOSIS — Z79899 Other long term (current) drug therapy: Secondary | ICD-10-CM | POA: Diagnosis not present

## 2021-08-05 LAB — URINE CULTURE
MICRO NUMBER:: 12345057
SPECIMEN QUALITY:: ADEQUATE

## 2021-08-10 ENCOUNTER — Telehealth: Payer: Self-pay | Admitting: Endocrinology

## 2021-08-10 DIAGNOSIS — E1065 Type 1 diabetes mellitus with hyperglycemia: Secondary | ICD-10-CM

## 2021-08-10 MED ORDER — DEXCOM G6 SENSOR MISC: 1.0000 | 0 refills | Status: AC

## 2021-08-10 NOTE — Telephone Encounter (Signed)
Rx sent to [facility] (patient requested)

## 2021-08-10 NOTE — Telephone Encounter (Signed)
Pt has an upcoming app on 09/14/2021 and is wondering if she can get a refill on Continuous Blood Gluc Sensor (DEXCOM G6 SENSOR) MISC as well as the transmitter.    Pt said it normally goes to [facility]. Pt would like a call back from nurse.

## 2021-08-19 DIAGNOSIS — E119 Type 2 diabetes mellitus without complications: Secondary | ICD-10-CM | POA: Diagnosis not present

## 2021-08-19 DIAGNOSIS — F332 Major depressive disorder, recurrent severe without psychotic features: Secondary | ICD-10-CM | POA: Diagnosis not present

## 2021-08-19 DIAGNOSIS — Z794 Long term (current) use of insulin: Secondary | ICD-10-CM | POA: Diagnosis not present

## 2021-08-19 DIAGNOSIS — F329 Major depressive disorder, single episode, unspecified: Secondary | ICD-10-CM | POA: Diagnosis not present

## 2021-08-19 DIAGNOSIS — F4310 Post-traumatic stress disorder, unspecified: Secondary | ICD-10-CM | POA: Diagnosis not present

## 2021-08-19 DIAGNOSIS — F411 Generalized anxiety disorder: Secondary | ICD-10-CM | POA: Diagnosis not present

## 2021-08-24 DIAGNOSIS — F332 Major depressive disorder, recurrent severe without psychotic features: Secondary | ICD-10-CM | POA: Diagnosis not present

## 2021-08-24 DIAGNOSIS — F411 Generalized anxiety disorder: Secondary | ICD-10-CM | POA: Diagnosis not present

## 2021-08-24 DIAGNOSIS — F329 Major depressive disorder, single episode, unspecified: Secondary | ICD-10-CM | POA: Diagnosis not present

## 2021-08-24 DIAGNOSIS — F4310 Post-traumatic stress disorder, unspecified: Secondary | ICD-10-CM | POA: Diagnosis not present

## 2021-08-30 DIAGNOSIS — J310 Chronic rhinitis: Secondary | ICD-10-CM | POA: Diagnosis not present

## 2021-08-30 DIAGNOSIS — H7292 Unspecified perforation of tympanic membrane, left ear: Secondary | ICD-10-CM | POA: Diagnosis not present

## 2021-08-30 DIAGNOSIS — H6692 Otitis media, unspecified, left ear: Secondary | ICD-10-CM | POA: Diagnosis not present

## 2021-08-30 DIAGNOSIS — J342 Deviated nasal septum: Secondary | ICD-10-CM | POA: Diagnosis not present

## 2021-09-07 DIAGNOSIS — K219 Gastro-esophageal reflux disease without esophagitis: Secondary | ICD-10-CM | POA: Diagnosis not present

## 2021-09-07 DIAGNOSIS — K581 Irritable bowel syndrome with constipation: Secondary | ICD-10-CM | POA: Diagnosis not present

## 2021-09-07 DIAGNOSIS — K3184 Gastroparesis: Secondary | ICD-10-CM | POA: Diagnosis not present

## 2021-09-09 ENCOUNTER — Other Ambulatory Visit: Payer: Self-pay

## 2021-09-09 ENCOUNTER — Telehealth: Payer: Self-pay | Admitting: Internal Medicine

## 2021-09-09 ENCOUNTER — Other Ambulatory Visit (INDEPENDENT_AMBULATORY_CARE_PROVIDER_SITE_OTHER): Payer: Medicare Other

## 2021-09-09 DIAGNOSIS — E7800 Pure hypercholesterolemia, unspecified: Secondary | ICD-10-CM

## 2021-09-09 DIAGNOSIS — E1065 Type 1 diabetes mellitus with hyperglycemia: Secondary | ICD-10-CM

## 2021-09-09 LAB — LIPID PANEL
Cholesterol: 234 mg/dL — ABNORMAL HIGH (ref 0–200)
HDL: 100.4 mg/dL (ref >39.00)
LDL Cholesterol: 119 mg/dL — ABNORMAL HIGH (ref 0–99)
NonHDL: 133.79
Total CHOL/HDL Ratio: 2
Triglycerides: 73 mg/dL (ref 0.0–149.0)
VLDL: 14.6 mg/dL (ref 0.0–40.0)

## 2021-09-09 LAB — COMPREHENSIVE METABOLIC PANEL
ALT: 35 U/L (ref 0–35)
AST: 30 U/L (ref 0–37)
Albumin: 4.3 g/dL (ref 3.5–5.2)
Alkaline Phosphatase: 101 U/L (ref 39–117)
BUN: 24 mg/dL — ABNORMAL HIGH (ref 6–23)
CO2: 33 mEq/L — ABNORMAL HIGH (ref 19–32)
Calcium: 9.9 mg/dL (ref 8.4–10.5)
Chloride: 100 mEq/L (ref 96–112)
Creatinine, Ser: 0.9 mg/dL (ref 0.40–1.20)
GFR: 71.33 mL/min (ref >60.00)
Glucose, Bld: 42 mg/dL — CL (ref 70–99)
Potassium: 4.1 mEq/L (ref 3.5–5.1)
Sodium: 138 mEq/L (ref 135–145)
Total Bilirubin: 0.4 mg/dL (ref 0.2–1.2)
Total Protein: 7.5 g/dL (ref 6.0–8.3)

## 2021-09-09 LAB — HEMOGLOBIN A1C: Hgb A1c MFr Bld: 8.9 % — ABNORMAL HIGH (ref 4.6–6.5)

## 2021-09-09 NOTE — Telephone Encounter (Signed)
Received a critical lab result on 09/09/2021 at 1720   Serum glucose 42 mg/dL   Spoke to the pt she was aware of this, she corrected hypoglycemia . She took 8 units of lantus this am and used a sliding scale  after eating rather then before , because her pre-meal Bg was 75 mg/dL   I have advised the pt to use sliding scale for pre-meal BG reading and not postprandial to prevent hypoglycemia     Pt expressed understanding   Abby Nena Jordan, MD  [facility] Endocrinology  [facility] [address] [place], [place] [address] Phone: [phone] FAX: [phone]

## 2021-09-12 ENCOUNTER — Other Ambulatory Visit: Payer: Medicare Other

## 2021-09-12 DIAGNOSIS — H90A12 Conductive hearing loss, unilateral, left ear with restricted hearing on the contralateral side: Secondary | ICD-10-CM | POA: Diagnosis not present

## 2021-09-12 DIAGNOSIS — H7292 Unspecified perforation of tympanic membrane, left ear: Secondary | ICD-10-CM | POA: Diagnosis not present

## 2021-09-13 DIAGNOSIS — Z79899 Other long term (current) drug therapy: Secondary | ICD-10-CM | POA: Diagnosis not present

## 2021-09-13 DIAGNOSIS — F609 Personality disorder, unspecified: Secondary | ICD-10-CM | POA: Diagnosis not present

## 2021-09-13 DIAGNOSIS — F411 Generalized anxiety disorder: Secondary | ICD-10-CM | POA: Diagnosis not present

## 2021-09-13 DIAGNOSIS — F4310 Post-traumatic stress disorder, unspecified: Secondary | ICD-10-CM | POA: Diagnosis not present

## 2021-09-13 DIAGNOSIS — F319 Bipolar disorder, unspecified: Secondary | ICD-10-CM | POA: Diagnosis not present

## 2021-09-14 ENCOUNTER — Encounter: Payer: Self-pay | Admitting: Endocrinology

## 2021-09-14 ENCOUNTER — Ambulatory Visit (INDEPENDENT_AMBULATORY_CARE_PROVIDER_SITE_OTHER): Payer: Medicare Other | Admitting: Endocrinology

## 2021-09-14 ENCOUNTER — Other Ambulatory Visit: Payer: Self-pay

## 2021-09-14 VITALS — BP 118/72 | HR 83 | Ht 62.0 in | Wt 150.0 lb

## 2021-09-14 DIAGNOSIS — K3184 Gastroparesis: Secondary | ICD-10-CM | POA: Diagnosis not present

## 2021-09-14 DIAGNOSIS — E1043 Type 1 diabetes mellitus with diabetic autonomic (poly)neuropathy: Secondary | ICD-10-CM | POA: Diagnosis not present

## 2021-09-14 DIAGNOSIS — E7800 Pure hypercholesterolemia, unspecified: Secondary | ICD-10-CM | POA: Diagnosis not present

## 2021-09-14 DIAGNOSIS — E1065 Type 1 diabetes mellitus with hyperglycemia: Secondary | ICD-10-CM | POA: Diagnosis not present

## 2021-09-14 MED ORDER — ROSUVASTATIN CALCIUM 5 MG PO TABS: 5.0000 mg | ORAL_TABLET | Freq: Every day | ORAL | 3 refills | Status: DC

## 2021-09-14 MED ORDER — LYUMJEV KWIKPEN 100 UNIT/ML ~~LOC~~ SOPN: PEN_INJECTOR | SUBCUTANEOUS | 1 refills | Status: AC

## 2021-09-14 NOTE — Patient Instructions (Addendum)
Lantus 10-11 in pm  Reduce Novolog when more active

## 2021-09-14 NOTE — Progress Notes (Signed)
Patient ID: Sheryl Matthews, female   DOB: [birth year], 55 y.o.   MRN: [id number]          Reason for Appointment : Follow-up for Type 1 Diabetes  History of Present Illness          Diagnosis: Type 1 diabetes mellitus, date of diagnosis:  at age 56    Previous history:   She has been on insulin since the time of diagnosis. She was on the same program of NPH and Regular Insulin since diagnosis was made when she was first seen here  Recent history:   INSULIN regimen:  Lantus 8 units a.m.-- 12 units at 9 p.m., Novolog 5-8 units,  1: 10 carbohydrate ratio, using. 1:50 correction.  Her A1c is 8.9 Last visit was in 11/21  Glucose patterns, management and problems identified:   She has marked lability in her blood sugar readings lately  As discussed in her CGM interpretation from her Dexcom sensor as follows she has difficulty controlling her blood sugars with her variable meals and snacks  She said that she is having most of her high readings in the evening when she is binging on carbohydrates  This is despite trying to take some extra NovoLog coverage for snacks  Also it is unclear whether she is consistently taking her NovoLog when she starts to eat and she thinks that sometimes she is waiting until her sugar goes up to take her NovoLog  Her overnight blood sugars are highly variable also and sometimes come down to low normal or low normal levels early morning but occasionally stay persistently high based on the type of snack she is having  Occasionally will get low sugars from being more active especially in the early afternoon  She is more insulin sensitive midday with some low sugars at times  Unclear also whether she is using carbohydrate counting for her NovoLog and mostly taking insulin based on her blood sugar level  She sometimes has significantly high readings after breakfast when eating cereal and may underestimate how much she needs to take for this including today Previously had  been approved for insulin pump but she thinks this is too overwhelming for her    CGM interpretation indicates the following patterns  She has marked VARIABILITY in her blood sugars especially overnight but continues to have inconsistent patterns during the day also  HIGHEST blood sugar is on an average late at night again rising usually after about 8-9 PM and then coming down variably by early morning OVERNIGHT blood sugars were generally higher in the first week and not consistently as high in the second week; blood sugars may come down to near normal or slightly low early morning around 6-8 AM POSTPRANDIAL readings are highly variable with significant spikes after all the meals especially evening meal Postprandial readings after lunch are not usually consistently high and may be relatively low by mid afternoon at times HYPOGLYCEMIA occurs inconsistently and midafternoon and sometimes around 7-8 PM Preprandial readings are mostly averaging about 180 at all meals and generally better in the first week at lunchtime Late evening hyperglycemia after 9 PM was most pronounced in the first week with blood sugars rising usually to 300 or so after 10 PM and not consistently coming down overnight  CGM use % of time   2-week average/GV 198, SD 88  Time in range        40%  % Time Above 180 30  % Time above 250 26  %  Time Below 70 3     Previous data:  CGM use % of time  100  2-week average/SD 171 +/-70  Time in range      58 % was 35  % Time Above 180  38  % Time above 250  17  % Time Below 70  3.3     PRE-MEAL Fasting Lunch Dinner  1-2 AM Overall  Glucose range:       Averages:  148  143  160  211    POST-MEAL PC Breakfast PC Lunch PC Dinner  Glucose range:     Averages:  164  155  181      Wt Readings from Last 3 Encounters:  08/03/21 145 lb 1.6 oz (65.8 kg)  07/06/21 142 lb (64.4 kg)  05/25/21 146 lb (66.2 kg)     Diabetes labs:  Lab Results  Component Value Date    HGBA1C 8.9 (H) 09/09/2021   HGBA1C 7.9 (H) 10/07/2020   HGBA1C 8.0 (H) 06/25/2020   Lab Results  Component Value Date   MICROALBUR <0.7 10/07/2020   LDLCALC 119 (H) 09/09/2021   CREATININE 0.90 09/09/2021    Lab Results  Component Value Date   FRUCTOSAMINE 347 (H) 07/12/2018   FRUCTOSAMINE 340 (H) 06/11/2017     Allergies as of 09/14/2021       Reactions   Latex Rash   Tape Rash   Elavil [amitriptyline Hcl]    Groggy/constipation/did not think worked    Lyrica [pregabalin]    Did not have benefit.  Falling asleep. Feeling depressed.        Medication List        Accurate as of September 14, 2021 11:09 AM. If you have any questions, ask your nurse or doctor.          ALPRAZolam 0.5 MG tablet Commonly known as: XANAX 1 tablet   buPROPion 300 MG 24 hr tablet Commonly known as: WELLBUTRIN XL Take 450 mg by mouth daily. Patient takes 450 mg per day   Dexcom G6 Sensor Misc 1 each by Does not apply route See admin instructions. Apply 1 sensor to body once every 10 days.   doxepin 10 MG capsule Commonly known as: SINEQUAN Take 10 mg by mouth at bedtime as needed.   esomeprazole 40 MG capsule Commonly known as: NEXIUM Nexium 40 mg capsule,delayed release   famotidine 20 MG tablet Commonly known as: PEPCID Take 20 mg by mouth 2 (two) times daily.   FLUoxetine 20 MG capsule Commonly known as: PROZAC Take 4 capsules (80 mg total) by mouth daily.   fluticasone 50 MCG/ACT nasal spray Commonly known as: FLONASE Place 1 spray into both nostrils daily.   FREESTYLE TEST STRIPS test strip Generic drug: glucose blood Check fasting blood sugar every morning and 2 hours after last two meals of the day. Check up to 3 times daily.   gabapentin 300 MG capsule Commonly known as: NEURONTIN Take 1 capsule (300 mg total) by mouth daily.   Glucagon Emergency 1 MG Kit INJECT 1 MG INTO THE MUSCLE FOR SEVERE HYPOGLYCEMIA   glucose monitoring kit monitoring kit 1  each by Does not apply route as needed for other.   HAIR/SKIN/NAILS PO Take by mouth.   ibuprofen 800 MG tablet Commonly known as: ADVIL Take 800 mg by mouth every 8 (eight) hours as needed.   insulin lispro 100 UNIT/ML KwikPen Commonly known as: HumaLOG KwikPen Inject 7-8 Units into the skin 3 (three) times  daily. Please dispense a 90 day supply   Ketostix strip Generic drug: acetone (urine) test USE TO CHECK URINE KETONES IF BLOOD SUGAR IS OVER 300 AS DIRECTED BY PHYSICIAN   Lantus SoloStar 100 UNIT/ML Solostar Pen Generic drug: insulin glargine INJECT 20 UNITS UNDER THE SKIN TWICE A DAY   metoCLOPramide 5 MG tablet Commonly known as: REGLAN Take 5 mg by mouth 4 (four) times daily.   NovoLOG FlexPen 100 UNIT/ML FlexPen Generic drug: insulin aspart Inject 7-8 units under the skin three times daily before meals.   ondansetron 4 MG tablet Commonly known as: ZOFRAN Take 4 mg by mouth every 8 (eight) hours as needed for nausea or vomiting.   pantoprazole 40 MG tablet Commonly known as: PROTONIX Take 40 mg by mouth daily.   PROBIOTIC ADVANCED PO Take by mouth.   Sure Comfort Pen Needles 31G X 5 MM Misc Generic drug: Insulin Pen Needle USE 7 PEN NEEDLES PER DAY TO INJECT INSULIN   TURMERIC CURCUMIN PO Take by mouth.        Allergies:  Allergies  Allergen Reactions   Latex Rash   Tape Rash   Elavil [Amitriptyline Hcl]     Groggy/constipation/did not think worked    Lyrica [Pregabalin]     Did not have benefit.  Falling asleep. Feeling depressed.    Past Medical History:  Diagnosis Date   Bipolar 1 disorder ([place])    Deafness in left ear    Depression    Diabetes mellitus without complication (HCC)    Gastroparesis    Osteoarthritis    Pain of left breast    Skin disorder     Past Surgical History:  Procedure Laterality Date   ANKLE FRACTURE SURGERY Left    BREAST ENHANCEMENT SURGERY     BREAST EXCISIONAL BIOPSY Left    CERVICAL DISC SURGERY      CESAREAN SECTION     cosmetic surgery facial     ELBOW SURGERY Right    GALLBLADDER SURGERY     INNER EAR SURGERY     MINOR CARPAL TUNNEL Left     Family History  Problem Relation Age of Onset   Depression Sister    Alcohol abuse Brother    Diabetes Brother    Depression Brother    Breast cancer Maternal Aunt     Social History:  reports that she has never smoked. She has never used smokeless tobacco. She reports that she does not currently use alcohol. She reports that she does not use drugs.     ROS    Lipids: She was told to start rosuvastatin last year but she ran out and lipids are poorly controlled  Lab Results  Component Value Date   CHOL 234 (H) 09/09/2021   CHOL 221 (H) 10/07/2020   CHOL 217 (H) 04/28/2019   Lab Results  Component Value Date   HDL 100.40 09/09/2021   HDL 79.30 10/07/2020   HDL 83.90 04/28/2019   Lab Results  Component Value Date   LDLCALC 119 (H) 09/09/2021   LDLCALC 128 (H) 10/07/2020   LDLCALC 119 (H) 04/28/2019   Lab Results  Component Value Date   TRIG 73.0 09/09/2021   TRIG 67.0 10/07/2020   TRIG 70.0 04/28/2019   Lab Results  Component Value Date   CHOLHDL 2 09/09/2021   CHOLHDL 3 10/07/2020   CHOLHDL 3 04/28/2019   Lab Results  Component Value Date   LDLDIRECT 85.0 09/02/2019     Depression and anxiety:  She is on multiple medications and is going to start lithium now because of persistent symptoms Also has been getting ECT    She has a history of Numbness and, tingling in her feet   Gastroparesis: She has nausea, with variable control, no recent vomiting.  Taking Reglan and Zofran with some relief   Physical Examination:  LMP 01/09/2018     ASSESSMENT:  Diabetes type 1, long-standing and persistently poorly controlled Diabetic gastroparesis, recently diagnosed  See history of present illness for detailed discussion of current diabetes management, blood sugar patterns and problems identified  Her A1c  is higher at 8.9  As above her blood sugars are very difficult to manage because of her stress eating Also does not always take her NovoLog before starting to eat as well as sometimes not enough for what she is eating She tends to have less insulin requirement around lunchtime especially when she is more active She has persistently high readings late at night from snacking but early morning may sometimes drop relatively low  She has a large supply of Lantus and wants to continue this for now However may benefit from a rapid acting analog insulin  Gastroparesis: Taking Reglan  Hyperlipidemia: Needs to restart rosuvastatin and new prescription will be sent   PLAN:    Trial of Lyumjev insulin of NovoLog and discussed faster onset of action She will try to take at least part of her insulin before starting to eat and not wait till the sugar is going up Discussed calculating mealtime dose based on carbohydrates and 1: 10 carbohydrate ratio She will reduce her NovoLog Lyumjev plan to be active  She will reduce Lantus by 1 to 2 units at night to avoid early morning low sugars More regular follow-up Well she is finishing her Lantus will switch her to Toujeo twice a day To have a snack before doing a lot of housework activity She will try to cut back on large carbohydrate snacks and sweets especially after supper and cover them adequately when she does eat Discussed the use of the OmniPod 5 pump and the use of the closed-loop system, she will look at this further and let us know if she wants to try it    Follow-up in 3 months  There are no Patient Instructions on file for this visit.      Elayne Snare 09/14/2021, 11:09 AM   Note: This note was prepared with Dragon voice recognition system technology. Any transcriptional errors that result from this process are unintentional.    Elayne Snare

## 2021-09-19 DIAGNOSIS — F4310 Post-traumatic stress disorder, unspecified: Secondary | ICD-10-CM | POA: Diagnosis not present

## 2021-09-19 DIAGNOSIS — F329 Major depressive disorder, single episode, unspecified: Secondary | ICD-10-CM | POA: Diagnosis not present

## 2021-09-19 DIAGNOSIS — F332 Major depressive disorder, recurrent severe without psychotic features: Secondary | ICD-10-CM | POA: Diagnosis not present

## 2021-09-19 DIAGNOSIS — Z794 Long term (current) use of insulin: Secondary | ICD-10-CM | POA: Diagnosis not present

## 2021-09-19 DIAGNOSIS — F411 Generalized anxiety disorder: Secondary | ICD-10-CM | POA: Diagnosis not present

## 2021-09-19 DIAGNOSIS — E119 Type 2 diabetes mellitus without complications: Secondary | ICD-10-CM | POA: Diagnosis not present

## 2021-09-21 DIAGNOSIS — F329 Major depressive disorder, single episode, unspecified: Secondary | ICD-10-CM | POA: Diagnosis not present

## 2021-09-21 DIAGNOSIS — F332 Major depressive disorder, recurrent severe without psychotic features: Secondary | ICD-10-CM | POA: Diagnosis not present

## 2021-09-27 ENCOUNTER — Telehealth: Payer: Self-pay | Admitting: Neurology

## 2021-09-27 ENCOUNTER — Telehealth: Payer: Medicare Other | Admitting: Physician Assistant

## 2021-09-27 DIAGNOSIS — N390 Urinary tract infection, site not specified: Secondary | ICD-10-CM

## 2021-09-27 NOTE — Progress Notes (Signed)
Based on what you shared with me, I feel your condition warrants further evaluation and I recommend that you be seen in a face to face visit. Giving back pain and dark brown urine along with status as a Type I diabetic with increased risk of complicated UTI/kidney infection, I really recommend that you be seen in person today so that you can get a urinalysis and culture to make sure this gets treated appropriately as can become very serious. Also need to make sure darker urine is due to blood from UTI and not coming from protein spilled into the urine from the kidneys.    NOTE: There will be NO CHARGE for this eVisit   If you are having a true medical emergency please call 911.      For an urgent face to face visit, [place] has six urgent care centers for your convenience:     [facility] [place] [facility] [place] Get Driving Directions [phone] [place], [place] [address]    [place] [facility] [place] [facility]) Get Driving Directions [phone] [place], [place] [address]  [facility] [place] [facility] [place] [facility]) Get Driving Directions [phone] [address] [place],  [place]  [address]  [facility] Get Driving Directions [phone] [place], [place], [place] [address]   [facility] Get Driving Directions  [phone] [address] [place], [place] [address]   [facility] [place] Get Driving Directions [phone] [address] [place], [place] [address]  Your MyChart E-visit questionnaire answers were reviewed by a board certified advanced clinical practitioner to complete your personal care plan based on your specific symptoms.  Thank you for using e-Visits.

## 2021-09-27 NOTE — Telephone Encounter (Signed)
Patient left vm asking for something for a UTI and did not want to come in for a visit.   According to chart, the patient had an evisit to address symptoms.

## 2021-09-29 ENCOUNTER — Encounter: Payer: Self-pay | Admitting: Family Medicine

## 2021-09-29 ENCOUNTER — Other Ambulatory Visit: Payer: Self-pay

## 2021-09-29 ENCOUNTER — Ambulatory Visit (INDEPENDENT_AMBULATORY_CARE_PROVIDER_SITE_OTHER): Payer: Medicare Other | Admitting: Family Medicine

## 2021-09-29 VITALS — BP 134/77 | HR 90 | Temp 97.9°F | Wt 142.1 lb

## 2021-09-29 DIAGNOSIS — N898 Other specified noninflammatory disorders of vagina: Secondary | ICD-10-CM | POA: Diagnosis not present

## 2021-09-29 DIAGNOSIS — R399 Unspecified symptoms and signs involving the genitourinary system: Secondary | ICD-10-CM

## 2021-09-29 DIAGNOSIS — N3001 Acute cystitis with hematuria: Secondary | ICD-10-CM

## 2021-09-29 LAB — POCT URINALYSIS DIP (CLINITEK)
Bilirubin, UA: NEGATIVE
Glucose, UA: 500 mg/dL — AB
Nitrite, UA: POSITIVE — AB
POC PROTEIN,UA: NEGATIVE
Spec Grav, UA: 1.015 (ref 1.010–1.025)
Urobilinogen, UA: 1 E.U./dL
pH, UA: 6 (ref 5.0–8.0)

## 2021-09-29 LAB — POCT UA - MICROALBUMIN
Albumin/Creatinine Ratio, Urine, POC: <30
Creatinine, POC: 300 mg/dL
Microalbumin Ur, POC: 30 mg/L

## 2021-09-29 MED ORDER — PHENAZOPYRIDINE HCL 200 MG PO TABS: 200.0000 mg | ORAL_TABLET | Freq: Three times a day (TID) | ORAL | 0 refills | Status: AC

## 2021-09-29 MED ORDER — SULFAMETHOXAZOLE-TRIMETHOPRIM 800-160 MG PO TABS: 1.0000 | ORAL_TABLET | Freq: Two times a day (BID) | ORAL | 0 refills | Status: AC

## 2021-09-29 NOTE — Progress Notes (Signed)
Acute Office Visit  Subjective:    Patient ID: Sheryl Matthews, female    DOB: [birth year], 55 y.o.   MRN: [id number]  Chief Complaint  Patient presents with   Urinary Tract Infection    Urinary Tract Infection   Patient is in today for UTI symptoms.   Patient states she has had 2 days of increased frequency, urgency, dysuria, malodorous urine, and mild suprapubic pressure. She has not yet tried anything for her symptoms. Also reporting some vaginal itching/burning for the past few days as well. Denies any sexual activity.    UA today with glucose >500. She saw her endocrinologist about a month ago and they ordered labs and changed her insulin - states she will go back for labs and has not yet started her new insulin.    Past Medical History:  Diagnosis Date   Bipolar 1 disorder ([place])    Deafness in left ear    Depression    Diabetes mellitus without complication (HCC)    Gastroparesis    Osteoarthritis    Pain of left breast    Skin disorder     Past Surgical History:  Procedure Laterality Date   ANKLE FRACTURE SURGERY Left    BREAST ENHANCEMENT SURGERY     BREAST EXCISIONAL BIOPSY Left    CERVICAL DISC SURGERY     CESAREAN SECTION     cosmetic surgery facial     ELBOW SURGERY Right    GALLBLADDER SURGERY     INNER EAR SURGERY     MINOR CARPAL TUNNEL Left     Family History  Problem Relation Age of Onset   Depression Sister    Alcohol abuse Brother    Diabetes Brother    Depression Brother    Breast cancer Maternal Aunt     Social History   Socioeconomic History   Marital status: Married    Spouse name: Louie Casa   Number of children: 1   Years of education: 13   Highest education level: Some college, no degree  Occupational History   Occupation: Agricultural engineer  Tobacco Use   Smoking status: Never   Smokeless tobacco: Never  Vaping Use   Vaping Use: Never used  Substance and Sexual Activity   Alcohol use: Not Currently    Alcohol/week: 0.0 standard  drinks   Drug use: No   Sexual activity: Not Currently    Partners: Male    Birth control/protection: None  Other Topics Concern   Not on file  Social History Narrative   Stays in bed a lot- has fibromyalgia   Social Determinants of Radio broadcast assistant Strain: Not on file  Food Insecurity: Not on file  Transportation Needs: Not on file  Physical Activity: Not on file  Stress: Not on file  Social Connections: Not on file  Intimate Partner Violence: Not on file    Outpatient Medications Prior to Visit  Medication Sig Dispense Refill   ALPRAZolam (XANAX) 0.5 MG tablet 1 tablet     Biotin w/ Vitamins C & E (HAIR/SKIN/NAILS PO) Take by mouth.     buPROPion (WELLBUTRIN XL) 300 MG 24 hr tablet Take 450 mg by mouth daily. Patient takes 450 mg per day     Continuous Blood Gluc Sensor (DEXCOM G6 SENSOR) MISC 1 each by Does not apply route See admin instructions. Apply 1 sensor to body once every 10 days. 3 each 0   doxepin (SINEQUAN) 10 MG capsule Take 10 mg by mouth at bedtime  as needed.     esomeprazole (NEXIUM) 40 MG capsule Nexium 40 mg capsule,delayed release     famotidine (PEPCID) 20 MG tablet Take 20 mg by mouth 2 (two) times daily.     fluticasone (FLONASE) 50 MCG/ACT nasal spray Place 1 spray into both nostrils daily. 16 g 6   gabapentin (NEURONTIN) 300 MG capsule Take 1 capsule (300 mg total) by mouth daily. 90 capsule 3   Glucagon, rDNA, (GLUCAGON EMERGENCY) 1 MG KIT INJECT 1 MG INTO THE MUSCLE FOR SEVERE HYPOGLYCEMIA 4 kit 5   glucose blood (FREESTYLE TEST STRIPS) test strip Check fasting blood sugar every morning and 2 hours after last two meals of the day. Check up to 3 times daily. 100 each 5   glucose monitoring kit (FREESTYLE) monitoring kit 1 each by Does not apply route as needed for other. 1 each 0   ibuprofen (ADVIL) 800 MG tablet Take 800 mg by mouth every 8 (eight) hours as needed.     Insulin Lispro-aabc (LYUMJEV KWIKPEN) 100 UNIT/ML KwikPen Inject 4 to 10  units before each meal based on carbohydrates and glucose level 30 mL 1   Insulin Pen Needle (SURE COMFORT PEN NEEDLES) 31G X 5 MM MISC USE 7 PEN NEEDLES PER DAY TO INJECT INSULIN 700 each 3   KETOSTIX strip USE TO CHECK URINE KETONES IF BLOOD SUGAR IS OVER 300 AS DIRECTED BY PHYSICIAN 100 each 2   LANTUS SOLOSTAR 100 UNIT/ML Solostar Pen INJECT 20 UNITS UNDER THE SKIN TWICE A DAY 45 mL 3   lithium carbonate (LITHOBID) 300 MG CR tablet Take by mouth.     metoCLOPramide (REGLAN) 5 MG tablet Take 5 mg by mouth 4 (four) times daily.     ondansetron (ZOFRAN) 4 MG tablet Take 4 mg by mouth every 8 (eight) hours as needed for nausea or vomiting.     pantoprazole (PROTONIX) 40 MG tablet Take 40 mg by mouth daily.     Probiotic Product (PROBIOTIC ADVANCED PO) Take by mouth.     rosuvastatin (CRESTOR) 5 MG tablet Take 1 tablet (5 mg total) by mouth daily. 90 tablet 3   TURMERIC CURCUMIN PO Take by mouth.     FLUoxetine (PROZAC) 20 MG capsule Take 4 capsules (80 mg total) by mouth daily. (Patient taking differently: Take 80 mg by mouth daily. ) 360 capsule 0   No facility-administered medications prior to visit.    Allergies  Allergen Reactions   Latex Rash and Itching    Other reaction(s): rash/itching    Amitriptyline     Other reaction(s): Other Groggy/constipation/did not think worked  Groggy/constipation/did not think worked    Pregabalin     Did not have benefit.  Falling asleep. Feeling depressed. Other reaction(s): Unknown Did not have benefit.  Falling asleep. Feeling depressed. Did not have benefit.  Falling asleep. Feeling depressed.   Tape Rash   Elavil [Amitriptyline Hcl]     Groggy/constipation/did not think worked    Other     Other reaction(s): Rash   Wound Dressing Adhesive     Review of Systems All review of systems negative except what is listed in the HPI     Objective:    Physical Exam Vitals reviewed.  Constitutional:      Appearance: Normal  appearance.  HENT:     Head: Normocephalic.  Cardiovascular:     Rate and Rhythm: Normal rate and regular rhythm.  Pulmonary:     Breath sounds: Normal breath sounds.  Abdominal:     General: Abdomen is flat. Bowel sounds are normal. There is no distension.     Palpations: Abdomen is soft. There is no mass.     Tenderness: There is no abdominal tenderness. There is no right CVA tenderness, left CVA tenderness, guarding or rebound.     Hernia: No hernia is present.  Genitourinary:    Comments: Deferred, self swab Skin:    General: Skin is warm and dry.  Neurological:     Mental Status: She is alert and oriented to person, place, and time.  Psychiatric:        Mood and Affect: Mood normal.        Behavior: Behavior normal.        Thought Content: Thought content normal.        Judgment: Judgment normal.    BP 134/77 (BP Location: Left Arm, Patient Position: Sitting, Cuff Size: Normal)   Pulse 90   Temp 97.9 F (36.6 C) (Oral)   Wt 142 lb 1.9 oz (64.5 kg)   LMP 01/09/2018   SpO2 100%   BMI 25.99 kg/m  Wt Readings from Last 3 Encounters:  09/29/21 142 lb 1.9 oz (64.5 kg)  09/14/21 150 lb (68 kg)  08/03/21 145 lb 1.6 oz (65.8 kg)    Health Maintenance Due  Topic Date Due   HIV Screening  Never done   Hepatitis C Screening  Never done   PAP SMEAR-Modifier  12/17/2018   MAMMOGRAM  01/09/2019   FOOT EXAM  05/01/2020   OPHTHALMOLOGY EXAM  06/04/2020   URINE MICROALBUMIN  10/07/2021    There are no preventive care reminders to display for this patient.   Lab Results  Component Value Date   TSH 1.83 05/25/2021   Lab Results  Component Value Date   WBC 5.5 05/25/2021   HGB 13.8 05/25/2021   HCT 42.9 05/25/2021   MCV 93.7 05/25/2021   PLT 301 05/25/2021   Lab Results  Component Value Date   NA 138 09/09/2021   K 4.1 09/09/2021   CO2 33 (H) 09/09/2021   GLUCOSE 42 (LL) 09/09/2021   BUN 24 (H) 09/09/2021   CREATININE 0.90 09/09/2021   BILITOT 0.4 09/09/2021    ALKPHOS 101 09/09/2021   AST 30 09/09/2021   ALT 35 09/09/2021   PROT 7.5 09/09/2021   ALBUMIN 4.3 09/09/2021   CALCIUM 9.9 09/09/2021   GFR 71.33 09/09/2021   Lab Results  Component Value Date   CHOL 234 (H) 09/09/2021   Lab Results  Component Value Date   HDL 100.40 09/09/2021   Lab Results  Component Value Date   LDLCALC 119 (H) 09/09/2021   Lab Results  Component Value Date   TRIG 73.0 09/09/2021   Lab Results  Component Value Date   CHOLHDL 2 09/09/2021   Lab Results  Component Value Date   HGBA1C 8.9 (H) 09/09/2021       Assessment & Plan:   1. UTI symptoms 2. Acute cystitis with hematuria 3. Vaginal irritation -Sending in Bactrim for UTI. We will also send the urine for culture and let you know results and any changes to plan.  -Adding pyridium for discomfort -Stay hydrated, good hygiene -Glucose control - follow-up with endocrinology and get the blood work that he ordered for you -Swab sent off to check for yeast and BV - we will let you know results and treatment plan  - sulfamethoxazole-trimethoprim (BACTRIM DS) 800-160 MG tablet; Take 1 tablet by mouth  2 (two) times daily for 3 days.  Dispense: 6 tablet; Refill: 0 - phenazopyridine (PYRIDIUM) 200 MG tablet; Take 1 tablet (200 mg total) by mouth 3 (three) times daily for 2 days.  Dispense: 6 tablet; Refill: 0 - POCT UA - Microalbumin - SureSwab Advanced Vaginitis Plus,TMA   Please contact office for follow-up if symptoms do not improve or worsen. Seek emergency care if symptoms become severe.  Purcell Nails Olevia Bowens, DNP, FNP-C

## 2021-09-29 NOTE — Patient Instructions (Addendum)
-  Sending in Bactrim for UTI. We will also send the urine for culture and let you know results and any changes to plan.  -Adding pyridium for discomfort -Stay hydrated, good hygiene -Glucose control - follow-up with endocrinology and get the blood work that he ordered for you -Swab sent off to check for yeast and BV - we will let you know results and treatment plan   Please contact office for follow-up if symptoms do not improve or worsen. Seek emergency care if symptoms become severe.

## 2021-10-02 LAB — URINALYSIS, ROUTINE W REFLEX MICROSCOPIC
Bilirubin Urine: NEGATIVE
Hyaline Cast: NONE SEEN /LPF
Nitrite: NEGATIVE
Specific Gravity, Urine: 1.041 — ABNORMAL HIGH (ref 1.001–1.035)
WBC, UA: >=60 /HPF — AB (ref 0–5)
pH: 5.5 (ref 5.0–8.0)

## 2021-10-02 LAB — URINE CULTURE
MICRO NUMBER:: 12592610
SPECIMEN QUALITY:: ADEQUATE

## 2021-10-03 LAB — SURESWAB® ADVANCED VAGINITIS PLUS,TMA
C. trachomatis RNA, TMA: NOT DETECTED
CANDIDA SPECIES: NOT DETECTED
Candida glabrata: NOT DETECTED
N. gonorrhoeae RNA, TMA: NOT DETECTED
SURESWAB(R) ADV BACTERIAL VAGINOSIS(BV),TMA: NEGATIVE
TRICHOMONAS VAGINALIS (TV),TMA: NOT DETECTED

## 2021-10-04 NOTE — Addendum Note (Signed)
Addended by: Caleen Jobs B on: 10/04/2021 08:05 AM   Modules accepted: Orders

## 2021-10-07 DIAGNOSIS — Z888 Allergy status to other drugs, medicaments and biological substances status: Secondary | ICD-10-CM | POA: Diagnosis not present

## 2021-10-07 DIAGNOSIS — J449 Chronic obstructive pulmonary disease, unspecified: Secondary | ICD-10-CM | POA: Diagnosis not present

## 2021-10-07 DIAGNOSIS — R739 Hyperglycemia, unspecified: Secondary | ICD-10-CM | POA: Diagnosis not present

## 2021-10-07 DIAGNOSIS — G8911 Acute pain due to trauma: Secondary | ICD-10-CM | POA: Diagnosis not present

## 2021-10-07 DIAGNOSIS — S4990XA Unspecified injury of shoulder and upper arm, unspecified arm, initial encounter: Secondary | ICD-10-CM | POA: Diagnosis not present

## 2021-10-07 DIAGNOSIS — S4992XA Unspecified injury of left shoulder and upper arm, initial encounter: Secondary | ICD-10-CM | POA: Diagnosis not present

## 2021-10-07 DIAGNOSIS — Z794 Long term (current) use of insulin: Secondary | ICD-10-CM | POA: Diagnosis not present

## 2021-10-07 DIAGNOSIS — M79603 Pain in arm, unspecified: Secondary | ICD-10-CM | POA: Diagnosis not present

## 2021-10-07 DIAGNOSIS — S80211A Abrasion, right knee, initial encounter: Secondary | ICD-10-CM | POA: Diagnosis not present

## 2021-10-07 DIAGNOSIS — S42302A Unspecified fracture of shaft of humerus, left arm, initial encounter for closed fracture: Secondary | ICD-10-CM | POA: Diagnosis not present

## 2021-10-07 DIAGNOSIS — Z91048 Other nonmedicinal substance allergy status: Secondary | ICD-10-CM | POA: Diagnosis not present

## 2021-10-07 DIAGNOSIS — S42352A Displaced comminuted fracture of shaft of humerus, left arm, initial encounter for closed fracture: Secondary | ICD-10-CM | POA: Diagnosis not present

## 2021-10-07 DIAGNOSIS — S42392A Other fracture of shaft of left humerus, initial encounter for closed fracture: Secondary | ICD-10-CM | POA: Diagnosis not present

## 2021-10-07 DIAGNOSIS — E119 Type 2 diabetes mellitus without complications: Secondary | ICD-10-CM | POA: Diagnosis not present

## 2021-10-07 DIAGNOSIS — Z91040 Latex allergy status: Secondary | ICD-10-CM | POA: Diagnosis not present

## 2021-10-07 DIAGNOSIS — Z79899 Other long term (current) drug therapy: Secondary | ICD-10-CM | POA: Diagnosis not present

## 2021-10-07 DIAGNOSIS — I10 Essential (primary) hypertension: Secondary | ICD-10-CM | POA: Diagnosis not present

## 2021-10-07 DIAGNOSIS — K219 Gastro-esophageal reflux disease without esophagitis: Secondary | ICD-10-CM | POA: Diagnosis not present

## 2021-10-10 ENCOUNTER — Telehealth: Payer: Self-pay | Admitting: General Practice

## 2021-10-10 DIAGNOSIS — S42302A Unspecified fracture of shaft of humerus, left arm, initial encounter for closed fracture: Secondary | ICD-10-CM | POA: Diagnosis not present

## 2021-10-10 DIAGNOSIS — R110 Nausea: Secondary | ICD-10-CM | POA: Diagnosis not present

## 2021-10-10 NOTE — Telephone Encounter (Signed)
Transition Care Management Follow-up Telephone Call Date of discharge and from where: 10/07/21 from [place] How have you been since you were released from the hospital? She broke her arm. She has surgery tomorrow. Any questions or concerns? No  Items Reviewed: Did the pt receive and understand the discharge instructions provided? Yes  Medications obtained and verified? Yes  Other? No  Any new allergies since your discharge? No  Dietary orders reviewed? Yes Do you have support at home? Yes   Home Care and Equipment/Supplies: Were home health services ordered? no   Functional Questionnaire: (I = Independent and D = Dependent) ADLs: I  Bathing/Dressing- I  Meal Prep- I  Eating- I  Maintaining continence- I  Transferring/Ambulation- I  Managing Meds- I  Follow up appointments reviewed:  PCP Hospital f/u appt confirmed?  Patient would like to wait unti her arm is healed post op to make her follow up with [facility].   [facility] f/u appt confirmed? Yes  Scheduled to see Orthopedics on 10/11/21 for her surgery. Are transportation arrangements needed? No  If their condition worsens, is the pt aware to call PCP or go to the Emergency Dept.? Yes Was the patient provided with contact information for the PCP's office or ED? Yes Was to pt encouraged to call back with questions or concerns? Yes

## 2021-10-11 DIAGNOSIS — X58XXXA Exposure to other specified factors, initial encounter: Secondary | ICD-10-CM | POA: Diagnosis not present

## 2021-10-11 DIAGNOSIS — G4733 Obstructive sleep apnea (adult) (pediatric): Secondary | ICD-10-CM | POA: Diagnosis not present

## 2021-10-11 DIAGNOSIS — F3112 Bipolar disorder, current episode manic without psychotic features, moderate: Secondary | ICD-10-CM | POA: Diagnosis not present

## 2021-10-11 DIAGNOSIS — G8918 Other acute postprocedural pain: Secondary | ICD-10-CM | POA: Diagnosis not present

## 2021-10-11 DIAGNOSIS — M47812 Spondylosis without myelopathy or radiculopathy, cervical region: Secondary | ICD-10-CM | POA: Diagnosis not present

## 2021-10-11 DIAGNOSIS — J449 Chronic obstructive pulmonary disease, unspecified: Secondary | ICD-10-CM | POA: Diagnosis not present

## 2021-10-11 DIAGNOSIS — K219 Gastro-esophageal reflux disease without esophagitis: Secondary | ICD-10-CM | POA: Diagnosis not present

## 2021-10-11 DIAGNOSIS — Z888 Allergy status to other drugs, medicaments and biological substances status: Secondary | ICD-10-CM | POA: Diagnosis not present

## 2021-10-11 DIAGNOSIS — Z79899 Other long term (current) drug therapy: Secondary | ICD-10-CM | POA: Diagnosis not present

## 2021-10-11 DIAGNOSIS — S42302A Unspecified fracture of shaft of humerus, left arm, initial encounter for closed fracture: Secondary | ICD-10-CM | POA: Diagnosis not present

## 2021-10-11 DIAGNOSIS — Z91040 Latex allergy status: Secondary | ICD-10-CM | POA: Diagnosis not present

## 2021-10-11 DIAGNOSIS — S42342A Displaced spiral fracture of shaft of humerus, left arm, initial encounter for closed fracture: Secondary | ICD-10-CM | POA: Diagnosis not present

## 2021-10-11 DIAGNOSIS — E109 Type 1 diabetes mellitus without complications: Secondary | ICD-10-CM | POA: Diagnosis not present

## 2021-10-11 DIAGNOSIS — S42202A Unspecified fracture of upper end of left humerus, initial encounter for closed fracture: Secondary | ICD-10-CM | POA: Diagnosis not present

## 2021-10-11 DIAGNOSIS — Z794 Long term (current) use of insulin: Secondary | ICD-10-CM | POA: Diagnosis not present

## 2021-10-12 DIAGNOSIS — J449 Chronic obstructive pulmonary disease, unspecified: Secondary | ICD-10-CM | POA: Diagnosis not present

## 2021-10-12 DIAGNOSIS — G4733 Obstructive sleep apnea (adult) (pediatric): Secondary | ICD-10-CM | POA: Diagnosis not present

## 2021-10-12 DIAGNOSIS — M47812 Spondylosis without myelopathy or radiculopathy, cervical region: Secondary | ICD-10-CM | POA: Diagnosis not present

## 2021-10-12 DIAGNOSIS — E109 Type 1 diabetes mellitus without complications: Secondary | ICD-10-CM | POA: Diagnosis not present

## 2021-10-12 DIAGNOSIS — S42202A Unspecified fracture of upper end of left humerus, initial encounter for closed fracture: Secondary | ICD-10-CM | POA: Diagnosis not present

## 2021-10-12 DIAGNOSIS — K219 Gastro-esophageal reflux disease without esophagitis: Secondary | ICD-10-CM | POA: Diagnosis not present

## 2021-10-23 ENCOUNTER — Other Ambulatory Visit: Payer: Self-pay | Admitting: Endocrinology

## 2021-10-24 DIAGNOSIS — Z9889 Other specified postprocedural states: Secondary | ICD-10-CM | POA: Diagnosis not present

## 2021-10-24 DIAGNOSIS — R52 Pain, unspecified: Secondary | ICD-10-CM | POA: Diagnosis not present

## 2021-10-24 DIAGNOSIS — Z8781 Personal history of (healed) traumatic fracture: Secondary | ICD-10-CM | POA: Diagnosis not present

## 2021-10-24 DIAGNOSIS — S42302A Unspecified fracture of shaft of humerus, left arm, initial encounter for closed fracture: Secondary | ICD-10-CM | POA: Diagnosis not present

## 2021-10-31 DIAGNOSIS — Z9889 Other specified postprocedural states: Secondary | ICD-10-CM | POA: Diagnosis not present

## 2021-10-31 DIAGNOSIS — S42302D Unspecified fracture of shaft of humerus, left arm, subsequent encounter for fracture with routine healing: Secondary | ICD-10-CM | POA: Diagnosis not present

## 2021-11-09 DIAGNOSIS — R29898 Other symptoms and signs involving the musculoskeletal system: Secondary | ICD-10-CM | POA: Diagnosis not present

## 2021-11-09 DIAGNOSIS — S42302D Unspecified fracture of shaft of humerus, left arm, subsequent encounter for fracture with routine healing: Secondary | ICD-10-CM | POA: Diagnosis not present

## 2021-11-15 DIAGNOSIS — F319 Bipolar disorder, unspecified: Secondary | ICD-10-CM | POA: Diagnosis not present

## 2021-12-13 ENCOUNTER — Other Ambulatory Visit: Payer: Medicare Other

## 2021-12-15 ENCOUNTER — Ambulatory Visit: Payer: Medicare Other | Admitting: Endocrinology

## 2022-01-30 ENCOUNTER — Ambulatory Visit (INDEPENDENT_AMBULATORY_CARE_PROVIDER_SITE_OTHER): Payer: Medicare Other | Admitting: Physician Assistant

## 2022-01-30 ENCOUNTER — Encounter: Payer: Self-pay | Admitting: Physician Assistant

## 2022-01-30 ENCOUNTER — Other Ambulatory Visit: Payer: Self-pay

## 2022-01-30 VITALS — BP 137/68 | HR 82 | Ht 62.0 in | Wt 144.0 lb

## 2022-01-30 DIAGNOSIS — R251 Tremor, unspecified: Secondary | ICD-10-CM | POA: Diagnosis not present

## 2022-01-30 DIAGNOSIS — R300 Dysuria: Secondary | ICD-10-CM | POA: Diagnosis not present

## 2022-01-30 DIAGNOSIS — E103293 Type 1 diabetes mellitus with mild nonproliferative diabetic retinopathy without macular edema, bilateral: Secondary | ICD-10-CM | POA: Diagnosis not present

## 2022-01-30 DIAGNOSIS — F3112 Bipolar disorder, current episode manic without psychotic features, moderate: Secondary | ICD-10-CM | POA: Diagnosis not present

## 2022-01-30 DIAGNOSIS — E1065 Type 1 diabetes mellitus with hyperglycemia: Secondary | ICD-10-CM

## 2022-01-30 DIAGNOSIS — K5904 Chronic idiopathic constipation: Secondary | ICD-10-CM | POA: Diagnosis not present

## 2022-01-30 LAB — POCT URINALYSIS DIP (CLINITEK)
Bilirubin, UA: NEGATIVE
Blood, UA: NEGATIVE
Glucose, UA: >=1000 mg/dL — AB
Ketones, POC UA: NEGATIVE mg/dL
Leukocytes, UA: NEGATIVE
Nitrite, UA: NEGATIVE
POC PROTEIN,UA: NEGATIVE
Spec Grav, UA: >=1.03 — AB (ref 1.010–1.025)
Urobilinogen, UA: 0.2 E.U./dL
pH, UA: 6 (ref 5.0–8.0)

## 2022-01-30 MED ORDER — TRULANCE 3 MG PO TABS: 1.0000 | ORAL_TABLET | Freq: Every day | ORAL | 1 refills | Status: DC

## 2022-01-30 NOTE — Patient Instructions (Addendum)
Referral to Neurology  Referral to Endocrinology Trial of trulance  Essential Tremor A tremor is trembling or shaking that a person cannot control. Most tremors affect the hands or arms. Tremors can also affect the head, vocal cords, legs, and other parts of the body. Essential tremor is a tremor without a known cause. Usually, it occurs while a person is trying to perform an action. It tends to get worse gradually as a person ages. What are the causes? The cause of this condition is not known. What increases the risk? You are more likely to develop this condition if: You have a family member with essential tremor. You are age 60 or older. You take certain medicines. What are the signs or symptoms? The main sign of a tremor is a rhythmic shaking of certain parts of your body that is uncontrolled and unintentional. You may: Have difficulty eating with a spoon or fork. Have difficulty writing. Nod your head up and down or side to side. Have a quivering voice. The shaking may: Get worse over time. Come and go. Be more noticeable on one side of your body. Get worse due to stress, fatigue, caffeine, and extreme heat or cold. How is this diagnosed? This condition may be diagnosed based on: Your symptoms and medical history. A physical exam. There is no single test to diagnose an essential tremor. However, your health care provider may order tests to rule out other causes of your condition. These may include: Blood and urine tests. Imaging studies of your brain, such as CT scan and MRI. A test that measures involuntary muscle movement (electromyogram). How is this treated? Treatment for essential tremor depends on the severity of the condition. Some tremors may go away without treatment. Mild tremors may not need treatment if they do not affect your day-to-day life. Severe tremors may need to be treated using one or more of the following options: Medicines. Lifestyle  changes. Occupational or physical therapy. Follow these instructions at home: Lifestyle  Do not use any products that contain nicotine or tobacco, such as cigarettes and e-cigarettes. If you need help quitting, ask your health care provider. Limit your caffeine intake as told by your health care provider. Try to get 8 hours of sleep each night. Find ways to manage your stress that fits your lifestyle and personality. Consider trying meditation or yoga. Try to anticipate stressful situations and allow extra time to manage them. If you are struggling emotionally with the effects of your tremor, consider working with a mental health provider. General instructions Take over-the-counter and prescription medicines only as told by your health care provider. Avoid extreme heat and extreme cold. Keep all follow-up visits as told by your health care provider. This is important. Visits may include physical therapy visits. Contact a health care provider if: You experience any changes in the location or intensity of your tremors. You start having a tremor after starting a new medicine. You have tremor with other symptoms, such as: Numbness. Tingling. Pain. Weakness. Your tremor gets worse. Your tremor interferes with your daily life. You feel down, blue, or sad for at least 2 weeks in a row. Worrying about your tremor and what other people think about you interferes with your everyday life functions, including relationships, work, or school. Summary Essential tremor is a tremor without a known cause. Usually, it occurs when you are trying to perform an action. You are more likely to develop this condition if you have a family member with essential tremor.  The main sign of a tremor is a rhythmic shaking of certain parts of your body that is uncontrolled and unintentional. Treatment for essential tremor depends on the severity of the condition. This information is not intended to replace advice  given to you by your health care provider. Make sure you discuss any questions you have with your health care provider. Document Revised: 08/04/2020 Document Reviewed: 08/06/2020 Elsevier Patient Education  2022 [place]) Capsules What is this medication? CARIPRAZINE (car i PRA zeen) treats schizophrenia and bipolar disorder. It works by balancing the levels of dopamine and serotonin in your brain, substances that help regulate mood, behaviors, and thoughts. It belongs to a group of medications called antipsychotics. Antipsychotic medications can be used to treat several kinds of mental health conditions. This medicine may be used for other purposes; ask your health care provider or pharmacist if you have questions. COMMON BRAND NAME(S): VRAYLAR What should I tell my care team before I take this medication? They need to know if you have any of these conditions: Dementia Diabetes Difficulty swallowing Have trouble controlling your muscles Heart disease High cholesterol History of breast cancer History of stroke Kidney disease Liver disease Low blood counts, like low white cell, platelet, or red cell counts Low blood pressure Parkinson's disease Seizures Suicidal thoughts, plans or attempt; a previous suicide attempt by you or a family member An unusual or allergic reaction to cariprazine, other medications, foods, dyes, or preservatives Pregnant or trying to get pregnant Breast-feeding How should I use this medication? Take this medication by mouth with a glass of water. Follow the directions on the prescription label. You may take it with or without food. Take your medication at regular intervals. Do not take it more often than directed. Do not stop taking except on your care team's advice. A special MedGuide will be given to you by the pharmacist with each prescription and refill. Be sure to read this information carefully each time. Talk to your care team  about the use of this medication in children. Special care may be needed. Overdosage: If you think you have taken too much of this medicine contact a poison control center or emergency room at once. NOTE: This medicine is only for you. Do not share this medicine with others. What if I miss a dose? If you miss a dose, take it as soon as you can. If it is almost time for your next dose, take only that dose. Do not take double or extra doses. What may interact with this medication? Do not take this medication with any of the following: Metoclopramide This medication may also interact with the following: Antihistamines for allergy, cough, and cold Carbamazepine Certain medications for anxiety or sleep Certain medications for depression like amitriptyline, fluoxetine, sertraline Certain medications for fungal infections like itraconazole, ketoconazole General anesthetics like halothane, isoflurane, methoxyflurane, propofol Levodopa or other medications for Parkinson's disease Medications for blood pressure Medications for seizures Medications that relax muscles for surgery Narcotic medications for pain Phenothiazines like chlorpromazine, prochlorperazine, thioridazine Rifampin This list may not describe all possible interactions. Give your health care provider a list of all the medicines, herbs, non-prescription drugs, or dietary supplements you use. Also tell them if you smoke, drink alcohol, or use illegal drugs. Some items may interact with your medicine. What should I watch for while using this medication? Visit your care team for regular checks on your progress. Tell your care team if symptoms do not start to get better or  if they get worse. Do not stop taking except on your care team's advice. You may develop a severe reaction. Your care team will tell you how much medication to take. Patients and their families should watch out for new or worsening depression or thoughts of suicide. Also  watch out for sudden changes in feelings such as feeling anxious, agitated, panicky, irritable, hostile, aggressive, impulsive, severely restless, overly excited and hyperactive, or not being able to sleep. If this happens, especially at the beginning of treatment or after a change in dose, call your care team. You may get dizzy or drowsy. Do not drive, use machinery, or do anything that needs mental alertness until you know how this medication affects you. Do not stand or sit up quickly, especially if you are an older patient. This reduces the risk of dizzy or fainting spells. Alcohol may interfere with the effect of this medication. Avoid alcoholic drinks. This medication may cause dry eyes and blurred vision. If you wear contact lenses you may feel some discomfort. Lubricating drops may help. See your eye doctor if the problem does not go away or is severe. This medication may increase blood sugar. Ask your care team if changes in diet or medications are needed if you have diabetes. This medication can cause problems with controlling your body temperature. It can lower the response of your body to cold temperatures. If possible, stay indoors during cold weather. If you must go outdoors, wear warm clothes. It can also lower the response of your body to heat. Do not overheat. Do not over-exercise. Stay out of the sun when possible. If you must be in the sun, wear cool clothing. Drink plenty of water. If you have trouble controlling your body temperature, call your care team right away. Women should inform their care team if they wish to become pregnant or think they might be pregnant. The effects of this medication on an unborn child are not known. A registry is available to monitor pregnancy outcomes in pregnant women exposed to this medication or similar medications. Talk to your care team or pharmacist for more information. What side effects may I notice from receiving this medication? Side effects that  you should report to your care team as soon as possible: Allergic reactions--skin rash, itching, hives, swelling of the face, lips, tongue, or throat High blood sugar (hyperglycemia)--increased thirst or amount of urine, unusual weakness or fatigue, blurry vision High fever, stiff muscles, increased sweating, fast or irregular heartbeat, and confusion, which may be signs of neuroleptic malignant syndrome Infection--fever, chills, cough, or sore throat Low blood pressure--dizziness, feeling faint or lightheaded, blurry vision Pain or trouble swallowing Seizures Stroke--sudden numbness or weakness of the face, arm, or leg, trouble speaking, confusion, trouble walking, loss of balance or coordination, dizziness, severe headache, change in vision Thoughts of suicide or self-harm, worsening mood, feelings of depression Uncontrolled and repetitive body movements, muscle stiffness or spasms, tremors or shaking, loss of balance or coordination, restlessness, shuffling walk, which may be signs of extrapyramidal symptoms (EPS) Side effects that usually do not require medical attention (report to your care team if they continue or are bothersome): Constipation Drowsiness Nausea Upset stomach Vomiting This list may not describe all possible side effects. Call your doctor for medical advice about side effects. You may report side effects to FDA at 1-800-FDA-1088. Where should I keep my medication? Keep out of the reach of children. Store at room temperature between 15 and 30 degrees C (59 and 86 degrees  F). Protect from light. Throw away any unused medication after the expiration date. NOTE: This sheet is a summary. It may not cover all possible information. If you have questions about this medicine, talk to your doctor, pharmacist, or health care provider.  2022 Elsevier/Gold Standard (2021-08-03 00:00:00)

## 2022-01-30 NOTE — Progress Notes (Signed)
Subjective:    Patient ID: Sheryl Matthews, female    DOB: 1966/01/22, 56 y.o.   MRN: [id number]  HPI Pt is a 56 yo female with T1DM, CIC, Bipolar 1, RLS who presents to the clinic for follow up.   She would like to see another endocrinologist because the drive to [name] is too far and her depression is keeping her from making this trip. She uses Dexcom and sugars have been running in 300s.   Her depression is not controlled. She has been having ECT treatments and on prozac and wellbutrin.   She has noticed that her hand tremor is getting worse and worse. She cannot do a lot of things that take fine motor skills.   Constipation continues. Linzess and amitiza did not help.   .. 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HCC) 12/22/2014   Pseudophakia of both eyes 12/22/2014   Abdominal pain 06/07/2015   Internal hemorrhoids 08/16/2015   Uncontrolled type 1 diabetes mellitus with hyperglycemia (HCC) 09/15/2015   HAV (hallux abducto valgus) 12/20/2015   Adductovarus rotation of toe, acquired 12/20/2015   Painful breasts 06/14/2016   History of fibrocystic disease of breast 06/14/2016   Left breast mass 07/12/2016   Fibromyalgia 12/26/2016   Vitamin D insufficiency 01/30/2017   Low iron stores 01/30/2017   SOB (shortness of breath) 02/02/2017   No energy 02/02/2017   Chest tightness 02/02/2017   Weakness 02/02/2017   RLS (restless legs syndrome) 12/18/2017   Chronic idiopathic constipation 12/24/2018   Posterior vitreous degeneration, bilateral 04/14/2019   Tinea manuum 05/05/2019   Toenail fungus 05/05/2019   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   OAB (overactive bladder) 08/12/2019   Esophageal dysphagia 11/10/2019   Nausea and vomiting 11/10/2019   Mid back pain on right side 01/05/2021   Fall 01/05/2021   Neck pain 01/05/2021   Tremor of both hands  05/25/2021   Benign paroxysmal positional vertigo, left 05/25/2021   Resolved Ambulatory Problems    Diagnosis Date Noted   Type I diabetes mellitus with peripheral autonomic neuropathy (HCC) 11/02/2014   Past Medical History:  Diagnosis Date   Bipolar 1 disorder (HCC)    Deafness in left ear    Depression    Diabetes mellitus without complication (HCC)    Gastroparesis    Osteoarthritis    Pain of left breast    Skin disorder        Review of Systems See HPI.     Objective:   Physical Exam   .. Results for orders placed or performed in visit on 01/30/22  Urine Culture   Specimen: Urine  Result Value Ref Range   MICRO NUMBER: [id number]    SPECIMEN QUALITY: Adequate    Sample Source URINE    STATUS: FINAL    Result: No Growth   POCT URINALYSIS DIP (CLINITEK)  Result Value Ref Range   Color, UA yellow yellow   Clarity, UA clear clear   Glucose, UA >=1,000 (A) negative mg/dL   Bilirubin, UA negative negative   Ketones, POC UA negative negative mg/dL   Spec Grav, UA >=8.413 (A) 1.010 - 1.025   Blood, UA negative negative   pH, UA 6.0 5.0 - 8.0   POC PROTEIN,UA negative negative, trace   Urobilinogen, UA 0.2 0.2 or 1.0 E.U./dL   Nitrite, UA Negative Negative   Leukocytes, UA Negative Negative  Assessment & Plan:  Marland KitchenMarland KitchenVendela was seen today for tremors.  Diagnoses and all orders for this visit:  Dysuria -     POCT URINALYSIS DIP (CLINITEK) -     Urine Culture  Chronic idiopathic constipation -     Plecanatide (TRULANCE) 3 MG TABS; Take 1 tablet by mouth daily. With food.  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   Discussed tremor seems like essential tremor and could try medication. Pt would like to see neurology first.   Endocrinology referral placed. Glucose uncontrolled with retinopathy and gastroparesis.   Trulance sent to try for constipation. Failed linzess and amitiza.

## 2022-02-01 DIAGNOSIS — Z8781 Personal history of (healed) traumatic fracture: Secondary | ICD-10-CM | POA: Diagnosis not present

## 2022-02-01 DIAGNOSIS — Z4789 Encounter for other orthopedic aftercare: Secondary | ICD-10-CM | POA: Diagnosis not present

## 2022-02-01 LAB — URINE CULTURE
MICRO NUMBER:: 13096248
Result:: NO GROWTH
SPECIMEN QUALITY:: ADEQUATE

## 2022-02-01 NOTE — Progress Notes (Signed)
No bacteria growth on urine culture.

## 2022-02-02 DIAGNOSIS — K581 Irritable bowel syndrome with constipation: Secondary | ICD-10-CM | POA: Diagnosis not present

## 2022-02-02 DIAGNOSIS — K219 Gastro-esophageal reflux disease without esophagitis: Secondary | ICD-10-CM | POA: Diagnosis not present

## 2022-02-02 DIAGNOSIS — K3184 Gastroparesis: Secondary | ICD-10-CM | POA: Diagnosis not present

## 2022-02-06 DIAGNOSIS — K5900 Constipation, unspecified: Secondary | ICD-10-CM | POA: Diagnosis not present

## 2022-02-06 DIAGNOSIS — K581 Irritable bowel syndrome with constipation: Secondary | ICD-10-CM | POA: Diagnosis not present

## 2022-02-13 DIAGNOSIS — F319 Bipolar disorder, unspecified: Secondary | ICD-10-CM | POA: Diagnosis not present

## 2022-02-14 DIAGNOSIS — F411 Generalized anxiety disorder: Secondary | ICD-10-CM | POA: Diagnosis not present

## 2022-02-14 DIAGNOSIS — F319 Bipolar disorder, unspecified: Secondary | ICD-10-CM | POA: Diagnosis not present

## 2022-02-14 DIAGNOSIS — Z79899 Other long term (current) drug therapy: Secondary | ICD-10-CM | POA: Diagnosis not present

## 2022-02-14 DIAGNOSIS — F609 Personality disorder, unspecified: Secondary | ICD-10-CM | POA: Diagnosis not present

## 2022-02-14 DIAGNOSIS — F4310 Post-traumatic stress disorder, unspecified: Secondary | ICD-10-CM | POA: Diagnosis not present

## 2022-02-17 DIAGNOSIS — Z794 Long term (current) use of insulin: Secondary | ICD-10-CM | POA: Diagnosis not present

## 2022-02-17 DIAGNOSIS — F3181 Bipolar II disorder: Secondary | ICD-10-CM | POA: Diagnosis not present

## 2022-02-17 DIAGNOSIS — E119 Type 2 diabetes mellitus without complications: Secondary | ICD-10-CM | POA: Diagnosis not present

## 2022-03-06 ENCOUNTER — Other Ambulatory Visit: Payer: Self-pay | Admitting: Endocrinology

## 2022-03-06 DIAGNOSIS — F319 Bipolar disorder, unspecified: Secondary | ICD-10-CM | POA: Diagnosis not present

## 2022-03-06 DIAGNOSIS — Z9151 Personal history of suicidal behavior: Secondary | ICD-10-CM | POA: Diagnosis not present

## 2022-03-06 DIAGNOSIS — Z794 Long term (current) use of insulin: Secondary | ICD-10-CM | POA: Diagnosis not present

## 2022-03-06 DIAGNOSIS — G4710 Hypersomnia, unspecified: Secondary | ICD-10-CM | POA: Diagnosis present

## 2022-03-06 DIAGNOSIS — F3181 Bipolar II disorder: Secondary | ICD-10-CM | POA: Diagnosis not present

## 2022-03-06 DIAGNOSIS — Z818 Family history of other mental and behavioral disorders: Secondary | ICD-10-CM | POA: Diagnosis not present

## 2022-03-06 DIAGNOSIS — F3130 Bipolar disorder, current episode depressed, mild or moderate severity, unspecified: Secondary | ICD-10-CM | POA: Diagnosis present

## 2022-03-06 DIAGNOSIS — Z833 Family history of diabetes mellitus: Secondary | ICD-10-CM | POA: Diagnosis not present

## 2022-03-06 DIAGNOSIS — Z811 Family history of alcohol abuse and dependence: Secondary | ICD-10-CM | POA: Diagnosis not present

## 2022-03-06 DIAGNOSIS — Z0489 Encounter for examination and observation for other specified reasons: Secondary | ICD-10-CM | POA: Diagnosis not present

## 2022-03-06 DIAGNOSIS — E10319 Type 1 diabetes mellitus with unspecified diabetic retinopathy without macular edema: Secondary | ICD-10-CM | POA: Diagnosis not present

## 2022-03-06 DIAGNOSIS — R45851 Suicidal ideations: Secondary | ICD-10-CM | POA: Diagnosis not present

## 2022-03-06 DIAGNOSIS — F4310 Post-traumatic stress disorder, unspecified: Secondary | ICD-10-CM | POA: Diagnosis present

## 2022-03-06 DIAGNOSIS — F411 Generalized anxiety disorder: Secondary | ICD-10-CM | POA: Diagnosis present

## 2022-03-06 DIAGNOSIS — E1065 Type 1 diabetes mellitus with hyperglycemia: Secondary | ICD-10-CM | POA: Diagnosis present

## 2022-03-06 DIAGNOSIS — Z814 Family history of other substance abuse and dependence: Secondary | ICD-10-CM | POA: Diagnosis not present

## 2022-03-06 DIAGNOSIS — Z79899 Other long term (current) drug therapy: Secondary | ICD-10-CM | POA: Diagnosis not present

## 2022-03-06 DIAGNOSIS — Z20822 Contact with and (suspected) exposure to covid-19: Secondary | ICD-10-CM | POA: Diagnosis present

## 2022-03-06 DIAGNOSIS — F609 Personality disorder, unspecified: Secondary | ICD-10-CM | POA: Diagnosis not present

## 2022-03-06 DIAGNOSIS — F314 Bipolar disorder, current episode depressed, severe, without psychotic features: Secondary | ICD-10-CM | POA: Diagnosis not present

## 2022-03-07 DIAGNOSIS — Z79899 Other long term (current) drug therapy: Secondary | ICD-10-CM | POA: Diagnosis not present

## 2022-03-07 DIAGNOSIS — Z811 Family history of alcohol abuse and dependence: Secondary | ICD-10-CM | POA: Diagnosis not present

## 2022-03-07 DIAGNOSIS — E10319 Type 1 diabetes mellitus with unspecified diabetic retinopathy without macular edema: Secondary | ICD-10-CM | POA: Diagnosis not present

## 2022-03-07 DIAGNOSIS — F3130 Bipolar disorder, current episode depressed, mild or moderate severity, unspecified: Secondary | ICD-10-CM | POA: Diagnosis present

## 2022-03-07 DIAGNOSIS — Z9151 Personal history of suicidal behavior: Secondary | ICD-10-CM | POA: Diagnosis not present

## 2022-03-07 DIAGNOSIS — R45851 Suicidal ideations: Secondary | ICD-10-CM | POA: Diagnosis not present

## 2022-03-07 DIAGNOSIS — F319 Bipolar disorder, unspecified: Secondary | ICD-10-CM | POA: Diagnosis not present

## 2022-03-07 DIAGNOSIS — Z814 Family history of other substance abuse and dependence: Secondary | ICD-10-CM | POA: Diagnosis not present

## 2022-03-07 DIAGNOSIS — E1065 Type 1 diabetes mellitus with hyperglycemia: Secondary | ICD-10-CM | POA: Diagnosis present

## 2022-03-07 DIAGNOSIS — G4710 Hypersomnia, unspecified: Secondary | ICD-10-CM | POA: Diagnosis present

## 2022-03-07 DIAGNOSIS — F609 Personality disorder, unspecified: Secondary | ICD-10-CM | POA: Diagnosis not present

## 2022-03-07 DIAGNOSIS — F411 Generalized anxiety disorder: Secondary | ICD-10-CM | POA: Diagnosis present

## 2022-03-07 DIAGNOSIS — Z794 Long term (current) use of insulin: Secondary | ICD-10-CM | POA: Diagnosis not present

## 2022-03-07 DIAGNOSIS — Z0489 Encounter for examination and observation for other specified reasons: Secondary | ICD-10-CM | POA: Diagnosis not present

## 2022-03-07 DIAGNOSIS — Z818 Family history of other mental and behavioral disorders: Secondary | ICD-10-CM | POA: Diagnosis not present

## 2022-03-07 DIAGNOSIS — Z20822 Contact with and (suspected) exposure to covid-19: Secondary | ICD-10-CM | POA: Diagnosis present

## 2022-03-07 DIAGNOSIS — F3181 Bipolar II disorder: Secondary | ICD-10-CM | POA: Diagnosis not present

## 2022-03-07 DIAGNOSIS — F4310 Post-traumatic stress disorder, unspecified: Secondary | ICD-10-CM | POA: Diagnosis present

## 2022-03-07 DIAGNOSIS — Z833 Family history of diabetes mellitus: Secondary | ICD-10-CM | POA: Diagnosis not present

## 2022-03-13 ENCOUNTER — Telehealth: Payer: Self-pay | Admitting: General Practice

## 2022-03-13 DIAGNOSIS — F319 Bipolar disorder, unspecified: Secondary | ICD-10-CM | POA: Diagnosis not present

## 2022-03-13 DIAGNOSIS — F332 Major depressive disorder, recurrent severe without psychotic features: Secondary | ICD-10-CM | POA: Diagnosis not present

## 2022-03-13 DIAGNOSIS — R7309 Other abnormal glucose: Secondary | ICD-10-CM | POA: Diagnosis not present

## 2022-03-13 NOTE — Telephone Encounter (Signed)
Transition Care Management Unsuccessful Follow-up Telephone Call ? ?Date of discharge and from where:  03/10/22 from [facility] ? ?Attempts:  1st Attempt ? ?Reason for unsuccessful TCM follow-up call:  Left voice message ? ?  ?

## 2022-03-15 NOTE — Telephone Encounter (Signed)
Transition Care Management Unsuccessful Follow-up Telephone Call ? ?Date of discharge and from where:  03/10/22 from [facility] ? ?Attempts:  2nd Attempt ? ?Reason for unsuccessful TCM follow-up call:  Left voice message ? ?  ?

## 2022-03-17 NOTE — Telephone Encounter (Signed)
Transition Care Management Unsuccessful Follow-up Telephone Call ? ?Date of discharge and from where:  03/10/22 from [facility] ? ?Attempts:  3rd Attempt ? ?Reason for unsuccessful TCM follow-up call:  Left voice message ? ?  ?

## 2022-03-22 DIAGNOSIS — F332 Major depressive disorder, recurrent severe without psychotic features: Secondary | ICD-10-CM | POA: Diagnosis not present

## 2022-03-22 DIAGNOSIS — E1059 Type 1 diabetes mellitus with other circulatory complications: Secondary | ICD-10-CM | POA: Diagnosis not present

## 2022-03-22 DIAGNOSIS — F3181 Bipolar II disorder: Secondary | ICD-10-CM | POA: Diagnosis not present

## 2022-03-28 DIAGNOSIS — F411 Generalized anxiety disorder: Secondary | ICD-10-CM | POA: Diagnosis not present

## 2022-03-28 DIAGNOSIS — Z79899 Other long term (current) drug therapy: Secondary | ICD-10-CM | POA: Diagnosis not present

## 2022-03-28 DIAGNOSIS — F4310 Post-traumatic stress disorder, unspecified: Secondary | ICD-10-CM | POA: Diagnosis not present

## 2022-03-28 DIAGNOSIS — F314 Bipolar disorder, current episode depressed, severe, without psychotic features: Secondary | ICD-10-CM | POA: Diagnosis not present

## 2022-03-31 ENCOUNTER — Ambulatory Visit (INDEPENDENT_AMBULATORY_CARE_PROVIDER_SITE_OTHER): Payer: Medicare Other | Admitting: Physician Assistant

## 2022-03-31 DIAGNOSIS — Z0000 Encounter for general adult medical examination without abnormal findings: Secondary | ICD-10-CM

## 2022-03-31 DIAGNOSIS — Z1231 Encounter for screening mammogram for malignant neoplasm of breast: Secondary | ICD-10-CM | POA: Diagnosis not present

## 2022-03-31 NOTE — Patient Instructions (Addendum)
?MEDICARE ANNUAL WELLNESS VISIT ?Health Maintenance Summary and Written Plan of Care ? ?[name] , ? ?Thank you for allowing me to perform your Medicare Annual Wellness Visit and for your ongoing commitment to your health.  ? ?Health Maintenance & Immunization History ?Health Maintenance  ?Topic Date Due  ?? PAP SMEAR-Modifier  03/31/2022 (Originally 12/17/2018)  ?? OPHTHALMOLOGY EXAM  03/31/2022 (Originally 06/04/2020)  ?? FOOT EXAM  04/01/2022 (Originally 05/01/2020)  ?? HEMOGLOBIN A1C  04/01/2022 (Originally 03/10/2022)  ?? Zoster Vaccines- Shingrix (2 of 2) 07/01/2022 (Originally 10/07/2019)  ?? MAMMOGRAM  01/31/2023 (Originally 01/09/2019)  ?? Hepatitis C Screening  04/01/2023 (Originally 11/25/1982)  ?? HIV Screening  04/01/2023 (Originally 11/26/1979)  ?? INFLUENZA VACCINE  06/27/2022  ?? URINE MICROALBUMIN  09/29/2022  ?? COLONOSCOPY (Pts 45-52yr Insurance coverage will need to be confirmed)  10/18/2026  ?? TETANUS/TDAP  10/31/2028  ?? HPV VACCINES  Aged Out  ? ?Immunization History  ?Administered Date(s) Administered  ?? HPV Quadrivalent 10/07/2020  ?? Influenza, Seasonal, Injecte, Preservative Fre 09/20/2015, 08/15/2016  ?? Influenza,inj,Quad PF,6+ Mos 08/31/2017, 07/23/2018, 08/11/2019  ?? Influenza-Unspecified 10/04/2007, 08/02/2018  ?? Pneumococcal Polysaccharide-23 05/02/2019  ?? Tdap 10/31/2018  ?? Zoster Recombinat (Shingrix) 08/12/2019  ? ? ?These are the patient goals that we discussed: ? Goals Addressed   ?  ?  ?  ?  ?  ? This Visit's Progress  ? ?  Patient Stated (pt-stated)     ?   Would like to get her bipolar disorder managed well with her medication.  ?  ?  ?  ? ?This is a list of Health Maintenance Items that are overdue or due now: ?Screening mammography ?Screening Pap smear and pelvic exam  ?Shingrix 2nd dose ?Eye exam ?Hemoglobin A1C ?Foot exam ?  ? ?Orders/Referrals Placed Today: ?No orders of the defined types were placed in this encounter. ? ?(Contact our referral department at  [phone] you have not spoken with someone about your referral appointment within the next 5 days)  ? ? ?Follow-up Plan ?Follow-up with BDonella Stade PA-C as planned ?Schedule your second dose of shingrix vaccine and your eye exam.  ?Referral for mammogram has been sent and they will call you to schedule.  ?Please schedule your pap smear with your gyn.  ?Foot exam and A1C can be done at your endocrinologist office or if you would like our office to do that; please call the office to make an appointment. ?Medicare wellness visit in one year.  ?AVS printed and mailed to the patient. ? ? ? ? ?  ?Health Maintenance, Female ?Adopting a healthy lifestyle and getting preventive care are important in promoting health and wellness. Ask your health care provider about: ?The right schedule for you to have regular tests and exams. ?Things you can do on your own to prevent diseases and keep yourself healthy. ?What should I know about diet, weight, and exercise? ?Eat a healthy diet ? ?Eat a diet that includes plenty of vegetables, fruits, low-fat dairy products, and lean protein. ?Do not eat a lot of foods that are high in solid fats, added sugars, or sodium. ?Maintain a healthy weight ?Body mass index (BMI) is used to identify weight problems. It estimates body fat based on height and weight. Your health care provider can help determine your BMI and help you achieve or maintain a healthy weight. ?Get regular exercise ?Get regular exercise. This is one of the most important things you can do for your health. Most adults should: ?Exercise for  at least 150 minutes each week. The exercise should increase your heart rate and make you sweat (moderate-intensity exercise). ?Do strengthening exercises at least twice a week. This is in addition to the moderate-intensity exercise. ?Spend less time sitting. Even light physical activity can be beneficial. ?Watch cholesterol and blood lipids ?Have your blood tested for lipids and  cholesterol at 58 years of age, then have this test every 5 years. ?Have your cholesterol levels checked more often if: ?Your lipid or cholesterol levels are high. ?You are older than 58 years of age. ?You are at high risk for heart disease. ?What should I know about cancer screening? ?Depending on your health history and family history, you may need to have cancer screening at various ages. This may include screening for: ?Breast cancer. ?Cervical cancer. ?Colorectal cancer. ?Skin cancer. ?Lung cancer. ?What should I know about heart disease, diabetes, and high blood pressure? ?Blood pressure and heart disease ?High blood pressure causes heart disease and increases the risk of stroke. This is more likely to develop in people who have high blood pressure readings or are overweight. ?Have your blood pressure checked: ?Every 3-5 years if you are 69-10 years of age. ?Every year if you are 32 years old or older. ?Diabetes ?Have regular diabetes screenings. This checks your fasting blood sugar level. Have the screening done: ?Once every three years after age 10 if you are at a normal weight and have a low risk for diabetes. ?More often and at a younger age if you are overweight or have a high risk for diabetes. ?What should I know about preventing infection? ?Hepatitis B ?If you have a higher risk for hepatitis B, you should be screened for this virus. Talk with your health care provider to find out if you are at risk for hepatitis B infection. ?Hepatitis C ?Testing is recommended for: ?Everyone born from [birth year] through 1967. ?Anyone with known risk factors for hepatitis C. ?Sexually transmitted infections (STIs) ?Get screened for STIs, including gonorrhea and chlamydia, if: ?You are sexually active and are younger than 58 years of age. ?You are older than 58 years of age and your health care provider tells you that you are at risk for this type of infection. ?Your sexual activity has changed since you were last screened,  and you are at increased risk for chlamydia or gonorrhea. Ask your health care provider if you are at risk. ?Ask your health care provider about whether you are at high risk for HIV. Your health care provider may recommend a prescription medicine to help prevent HIV infection. If you choose to take medicine to prevent HIV, you should first get tested for HIV. You should then be tested every 3 months for as long as you are taking the medicine. ?Pregnancy ?If you are about to stop having your period (premenopausal) and you may become pregnant, seek counseling before you get pregnant. ?Take 400 to 800 micrograms (mcg) of folic acid every day if you become pregnant. ?Ask for birth control (contraception) if you want to prevent pregnancy. ?Osteoporosis and menopause ?Osteoporosis is a disease in which the bones lose minerals and strength with aging. This can result in bone fractures. If you are 73 years old or older, or if you are at risk for osteoporosis and fractures, ask your health care provider if you should: ?Be screened for bone loss. ?Take a calcium or vitamin D supplement to lower your risk of fractures. ?Be given hormone replacement therapy (HRT) to treat symptoms of menopause. ?  Follow these instructions at home: ?Alcohol use ?Do not drink alcohol if: ?Your health care provider tells you not to drink. ?You are pregnant, may be pregnant, or are planning to become pregnant. ?If you drink alcohol: ?Limit how much you have to: ?0-1 drink a day. ?Know how much alcohol is in your drink. In the [place] one drink equals one 12 oz bottle of beer (355 mL), one 5 oz glass of wine (148 mL), or one 1? oz glass of hard liquor (44 mL). ?Lifestyle ?Do not use any products that contain nicotine or tobacco. These products include cigarettes, chewing tobacco, and vaping devices, such as e-cigarettes. If you need help quitting, ask your health care provider. ?Do not use street drugs. ?Do not share needles. ?Ask your health care  provider for help if you need support or information about quitting drugs. ?General instructions ?Schedule regular health, dental, and eye exams. ?Stay current with your vaccines. ?Tell your health care provider if: ?

## 2022-03-31 NOTE — Progress Notes (Signed)
? ? ?[place] VISIT ? ?03/31/2022 ? ?Telephone Visit Disclaimer ?This Medicare AWV was conducted by telephone due to national recommendations for restrictions regarding the COVID-19 Pandemic (e.g. social distancing).  I verified, using two identifiers, that I am speaking with Sheryl Matthews or their authorized healthcare agent. I discussed the limitations, risks, security, and privacy concerns of performing an evaluation and management service by telephone and the potential availability of an in-person appointment in the future. The patient expressed understanding and agreed to proceed.  ?Location of Patient: Home ?Location of Provider (nurse):  In the office ? ?Subjective:  ? ? ?Sheryl Matthews is a 56 y.o. female patient of Donella Stade, PA-C who had a [employer] Visit today via telephone. Arturo is Legally disabled and lives with their spouse. she has 1 child. she reports that she is socially active and does interact with friends/family regularly. she is minimally physically active and enjoys adult coloring and spending time with her dog. ? ?Patient Care Team: ?Lavada Mesi as PCP - General (Family Medicine) ? ? ?  03/31/2022  ? 10:13 AM 02/17/2020  ?  3:23 PM 01/06/2020  ?  1:09 PM 12/24/2015  ?  1:24 PM  ?Advanced Directives  ?Does Patient Have a Medical Advance Directive? Yes Yes Yes No  ?Type of Advance Directive Living will  Living will;Healthcare Power of Attorney   ?Does patient want to make changes to medical advance directive? No - Patient declined  No - Patient declined   ?Copy of [place] in Chart?   No - copy requested   ?Would patient like information on creating a medical advance directive?    Yes - Educational materials given  ? ? ?Hospital Utilization Over the Past 12 Months: ?# of hospitalizations or ER visits: 2 ?# of surgeries: 1 ? ?Review of Systems    ?Patient reports that her overall health is unchanged compared to last year. ? ?History  obtained from chart review and the patient ? ?Patient Reported Readings (BP, Pulse, CBG, Weight, etc) ?none ? ?Pain Assessment ?Pain : No/denies pain ? ?  ? ?Current Medications & Allergies (verified) ?Allergies as of 03/31/2022   ? ?   Reactions  ? Latex Rash, Itching  ? Other reaction(s): rash/itching  ? Amitriptyline   ? Other reaction(s): Other ?Groggy/constipation/did not think worked  ?Groggy/constipation/did not think worked   ? Pregabalin   ? Did not have benefit.  ?Falling asleep. ?Feeling depressed. ?Other reaction(s): Unknown ?Did not have benefit.  ?Falling asleep. ?Feeling depressed. ?Did not have benefit.  ?Falling asleep. ?Feeling depressed.  ? Tape Rash  ? Elavil [amitriptyline Hcl]   ? Groggy/constipation/did not think worked   ? Lamotrigine Itching  ? Per [employer]  ? Other   ? Other reaction(s): Rash  ? Wound Dressing Adhesive   ? ?  ? ?  ?Medication List  ?  ? ?  ? Accurate as of Mar 31, 2022 10:39 AM. If you have any questions, ask your nurse or doctor.  ?  ?  ? ?  ? ?ALPRAZolam 0.5 MG tablet ?Commonly known as: Duanne Moron ?As needed. ?  ?ARIPiprazole 5 MG tablet ?Commonly known as: ABILIFY ?Take 1 tablet by mouth daily with breakfast. ?  ?buPROPion 300 MG 24 hr tablet ?Commonly known as: WELLBUTRIN XL ?Take 450 mg by mouth daily. Patient takes 450 mg per day ?  ?Dexcom G6 Sensor Misc ?1 each by Does not apply route See admin instructions. Apply  1 sensor to body once every 10 days. ?  ?doxepin 10 MG capsule ?Commonly known as: SINEQUAN ?Take 10 mg by mouth at bedtime. ?  ?esomeprazole 40 MG capsule ?Commonly known as: [place] ?Nexium 40 mg capsule,delayed release ?  ?famotidine 20 MG tablet ?Commonly known as: PEPCID ?Take 20 mg by mouth 2 (two) times daily. ?  ?fexofenadine 180 MG tablet ?Commonly known as: ALLEGRA ?Take by mouth. ?  ?FLUoxetine 20 MG capsule ?Commonly known as: PROZAC ?Take 4 capsules (80 mg total) by mouth daily. ?  ?FLUoxetine 40 MG capsule ?Commonly known as: PROZAC ?Take by mouth. ?   ?fluticasone 50 MCG/ACT nasal spray ?Commonly known as: FLONASE ?Place 1 spray into both nostrils daily. ?  ?FREESTYLE TEST STRIPS test strip ?Generic drug: glucose blood ?Check fasting blood sugar every morning and 2 hours after last two meals of the day. Check up to 3 times daily. ?  ?gabapentin 300 MG capsule ?Commonly known as: NEURONTIN ?Take 1 capsule (300 mg total) by mouth daily. ?  ?Glucagon Emergency 1 MG Kit ?INJECT 1 MG INTO THE MUSCLE FOR SEVERE HYPOGLYCEMIA ?  ?glucose monitoring kit monitoring kit ?1 each by Does not apply route as needed for other. ?  ?HAIR/SKIN/NAILS PO ?Take by mouth. ?  ?HYDROcodone-acetaminophen 5-325 MG tablet ?Commonly known as: NORCO/VICODIN ?As needed. ?  ?ibuprofen 800 MG tablet ?Commonly known as: ADVIL ?Take 800 mg by mouth every 8 (eight) hours as needed. ?  ?Ketostix strip ?Generic drug: acetone (urine) test ?USE TO CHECK URINE KETONES IF BLOOD SUGAR IS OVER 300 AS DIRECTED BY PHYSICIAN ?  ?Lantus SoloStar 100 UNIT/ML Solostar Pen ?Generic drug: insulin glargine ?INJECT 20 UNITS UNDER THE SKIN TWICE A DAY ?What changed: See the new instructions. ?  ?Linzess 290 MCG Caps capsule ?Generic drug: linaclotide ?Take 290 mcg by mouth daily. ?  ?Lyumjev KwikPen 100 UNIT/ML KwikPen ?Generic drug: Insulin Lispro-aabc ?Inject 4 to 10 units before each meal based on carbohydrates and glucose level ?  ?metoCLOPramide 5 MG tablet ?Commonly known as: REGLAN ?Take 5 mg by mouth 4 (four) times daily. ?  ?NovoLOG FlexPen 100 UNIT/ML FlexPen ?Generic drug: insulin aspart ?INJECT 7 TO 8 UNITS UNDER THE SKIN THREE TIMES A DAY BEFORE MEALS ?  ?ondansetron 4 MG tablet ?Commonly known as: ZOFRAN ?Take 4 mg by mouth every 8 (eight) hours as needed for nausea or vomiting. ?  ?pantoprazole 40 MG tablet ?Commonly known as: PROTONIX ?Take 40 mg by mouth daily. ?  ?PROBIOTIC ADVANCED PO ?Take by mouth. ?  ?rosuvastatin 5 MG tablet ?Commonly known as: Crestor ?Take 1 tablet (5 mg total) by mouth  daily. ?  ?Sure Comfort Pen Needles 31G X 5 MM Misc ?Generic drug: Insulin Pen Needle ?USE 7 PEN NEEDLES PER DAY TO INJECT INSULIN ?  ?Trulance 3 MG Tabs ?Generic drug: Plecanatide ?Take 1 tablet by mouth daily. With food. ?  ?TURMERIC CURCUMIN PO ?Take by mouth. ?  ? ?  ? ? ?History (reviewed): ?Past Medical History:  ?Diagnosis Date  ? Bipolar 1 disorder ([place])   ? Deafness in left ear   ? Depression   ? Diabetes mellitus without complication ([place])   ? Gastroparesis   ? Osteoarthritis   ? Pain of left breast   ? Skin disorder   ? ?Past Surgical History:  ?Procedure Laterality Date  ? ANKLE FRACTURE SURGERY Left   ? BREAST ENHANCEMENT SURGERY    ? BREAST EXCISIONAL BIOPSY Left   ? CERVICAL DISC SURGERY    ?  CESAREAN SECTION    ? cosmetic surgery facial    ? ELBOW SURGERY Right   ? GALLBLADDER SURGERY    ? INNER EAR SURGERY    ? MINOR CARPAL TUNNEL Left   ? ?Family History  ?Problem Relation Age of Onset  ? Depression Sister   ? Alcohol abuse Brother   ? Diabetes Brother   ? Depression Brother   ? Breast cancer Maternal Aunt   ? ?Social History  ? ?Socioeconomic History  ? Marital status: Married  ?  Spouse name: Louie Casa  ? Number of children: 1  ? Years of education: 92  ? Highest education level: Some college, no degree  ?Occupational History  ? Occupation: Homemaker  ? Occupation: Disabled  ?Tobacco Use  ? Smoking status: Never  ? Smokeless tobacco: Never  ?Vaping Use  ? Vaping Use: Never used  ?Substance and Sexual Activity  ? Alcohol use: Not Currently  ?  Alcohol/week: 0.0 standard drinks  ? Drug use: No  ? Sexual activity: Not Currently  ?  Partners: Male  ?  Birth control/protection: None  ?Other Topics Concern  ? Not on file  ?Social History Narrative  ? Stays in bed a lot- has fibromyalgia  ? She lives with her husband. She has one child. She enjoys adult coloring and spending time with her dog.  ? ?Social Determinants of Health  ? ?Financial Resource Strain: Low Risk   ? Difficulty of Paying Living Expenses:  Not hard at all  ?Food Insecurity: No Food Insecurity  ? Worried About Charity fundraiser in the Last Year: Never true  ? Ran Out of Food in the Last Year: Never true  ?Transportation Needs: No Transportat

## 2022-04-04 DIAGNOSIS — F319 Bipolar disorder, unspecified: Secondary | ICD-10-CM | POA: Diagnosis not present

## 2022-04-04 DIAGNOSIS — F3181 Bipolar II disorder: Secondary | ICD-10-CM | POA: Diagnosis not present

## 2022-04-11 DIAGNOSIS — E119 Type 2 diabetes mellitus without complications: Secondary | ICD-10-CM | POA: Diagnosis not present

## 2022-04-11 DIAGNOSIS — F3181 Bipolar II disorder: Secondary | ICD-10-CM | POA: Diagnosis not present

## 2022-04-18 DIAGNOSIS — F3181 Bipolar II disorder: Secondary | ICD-10-CM | POA: Diagnosis not present

## 2022-04-18 DIAGNOSIS — F319 Bipolar disorder, unspecified: Secondary | ICD-10-CM | POA: Diagnosis not present

## 2022-04-18 DIAGNOSIS — E1069 Type 1 diabetes mellitus with other specified complication: Secondary | ICD-10-CM | POA: Diagnosis not present

## 2022-04-28 DIAGNOSIS — F332 Major depressive disorder, recurrent severe without psychotic features: Secondary | ICD-10-CM | POA: Diagnosis not present

## 2022-04-28 DIAGNOSIS — E109 Type 1 diabetes mellitus without complications: Secondary | ICD-10-CM | POA: Diagnosis not present

## 2022-04-28 DIAGNOSIS — F3181 Bipolar II disorder: Secondary | ICD-10-CM | POA: Diagnosis not present

## 2022-05-05 DIAGNOSIS — K581 Irritable bowel syndrome with constipation: Secondary | ICD-10-CM | POA: Diagnosis not present

## 2022-05-05 DIAGNOSIS — K3184 Gastroparesis: Secondary | ICD-10-CM | POA: Diagnosis not present

## 2022-05-08 DIAGNOSIS — F3181 Bipolar II disorder: Secondary | ICD-10-CM | POA: Diagnosis not present

## 2022-05-19 DIAGNOSIS — Z794 Long term (current) use of insulin: Secondary | ICD-10-CM | POA: Diagnosis not present

## 2022-05-19 DIAGNOSIS — F3181 Bipolar II disorder: Secondary | ICD-10-CM | POA: Diagnosis not present

## 2022-05-19 DIAGNOSIS — E109 Type 1 diabetes mellitus without complications: Secondary | ICD-10-CM | POA: Diagnosis not present

## 2022-05-23 DIAGNOSIS — F3181 Bipolar II disorder: Secondary | ICD-10-CM | POA: Diagnosis not present

## 2022-05-31 ENCOUNTER — Encounter: Payer: Medicare Other | Admitting: Physician Assistant

## 2022-05-31 DIAGNOSIS — E1065 Type 1 diabetes mellitus with hyperglycemia: Secondary | ICD-10-CM | POA: Diagnosis not present

## 2022-06-02 DIAGNOSIS — E109 Type 1 diabetes mellitus without complications: Secondary | ICD-10-CM | POA: Diagnosis not present

## 2022-06-02 DIAGNOSIS — F3181 Bipolar II disorder: Secondary | ICD-10-CM | POA: Diagnosis not present

## 2022-06-02 DIAGNOSIS — Z794 Long term (current) use of insulin: Secondary | ICD-10-CM | POA: Diagnosis not present

## 2022-06-06 DIAGNOSIS — F314 Bipolar disorder, current episode depressed, severe, without psychotic features: Secondary | ICD-10-CM | POA: Diagnosis not present

## 2022-06-06 DIAGNOSIS — F4310 Post-traumatic stress disorder, unspecified: Secondary | ICD-10-CM | POA: Diagnosis not present

## 2022-06-06 DIAGNOSIS — Z79899 Other long term (current) drug therapy: Secondary | ICD-10-CM | POA: Diagnosis not present

## 2022-06-06 DIAGNOSIS — F411 Generalized anxiety disorder: Secondary | ICD-10-CM | POA: Diagnosis not present

## 2022-06-14 ENCOUNTER — Encounter: Payer: Medicare Other | Admitting: Physician Assistant

## 2022-06-16 DIAGNOSIS — F3181 Bipolar II disorder: Secondary | ICD-10-CM | POA: Diagnosis not present

## 2022-06-16 DIAGNOSIS — E109 Type 1 diabetes mellitus without complications: Secondary | ICD-10-CM | POA: Diagnosis not present

## 2022-06-27 DIAGNOSIS — F3181 Bipolar II disorder: Secondary | ICD-10-CM | POA: Diagnosis not present

## 2022-06-27 DIAGNOSIS — F332 Major depressive disorder, recurrent severe without psychotic features: Secondary | ICD-10-CM | POA: Diagnosis not present

## 2022-06-27 DIAGNOSIS — E119 Type 2 diabetes mellitus without complications: Secondary | ICD-10-CM | POA: Diagnosis not present

## 2022-07-11 ENCOUNTER — Encounter: Payer: Self-pay | Admitting: Physician Assistant

## 2022-07-11 ENCOUNTER — Other Ambulatory Visit (HOSPITAL_COMMUNITY)
Admission: RE | Admit: 2022-07-11 | Discharge: 2022-07-11 | Disposition: A | Discharge status: Home / Self Care | Payer: Medicare Other | Source: Ambulatory Visit | Attending: Physician Assistant | Admitting: Physician Assistant

## 2022-07-11 ENCOUNTER — Ambulatory Visit (INDEPENDENT_AMBULATORY_CARE_PROVIDER_SITE_OTHER): Payer: Medicare Other | Admitting: Physician Assistant

## 2022-07-11 VITALS — BP 123/74 | HR 89 | Temp 98.4°F | Ht 62.0 in | Wt 162.0 lb

## 2022-07-11 DIAGNOSIS — R350 Frequency of micturition: Secondary | ICD-10-CM | POA: Diagnosis not present

## 2022-07-11 DIAGNOSIS — N3281 Overactive bladder: Secondary | ICD-10-CM | POA: Diagnosis not present

## 2022-07-11 DIAGNOSIS — Z1151 Encounter for screening for human papillomavirus (HPV): Secondary | ICD-10-CM | POA: Diagnosis not present

## 2022-07-11 DIAGNOSIS — R399 Unspecified symptoms and signs involving the genitourinary system: Secondary | ICD-10-CM

## 2022-07-11 DIAGNOSIS — Z01419 Encounter for gynecological examination (general) (routine) without abnormal findings: Secondary | ICD-10-CM | POA: Insufficient documentation

## 2022-07-11 DIAGNOSIS — Z0000 Encounter for general adult medical examination without abnormal findings: Secondary | ICD-10-CM | POA: Diagnosis not present

## 2022-07-11 DIAGNOSIS — Z124 Encounter for screening for malignant neoplasm of cervix: Secondary | ICD-10-CM

## 2022-07-11 DIAGNOSIS — E109 Type 1 diabetes mellitus without complications: Secondary | ICD-10-CM | POA: Diagnosis not present

## 2022-07-11 DIAGNOSIS — F3181 Bipolar II disorder: Secondary | ICD-10-CM | POA: Diagnosis not present

## 2022-07-11 LAB — POCT URINALYSIS DIP (CLINITEK)
Blood, UA: NEGATIVE
Glucose, UA: 500 mg/dL — AB
Ketones, POC UA: NEGATIVE mg/dL
Leukocytes, UA: NEGATIVE
Nitrite, UA: NEGATIVE
POC PROTEIN,UA: 100 — AB
Spec Grav, UA: 1.025 (ref 1.010–1.025)
Urobilinogen, UA: 2 E.U./dL — AB
pH, UA: 7 (ref 5.0–8.0)

## 2022-07-11 NOTE — Progress Notes (Unsigned)
Complete physical exam  Patient: Sheryl Matthews   DOB: 1966/02/18   56 y.o. Female  MRN: [id number]  Subjective:    Chief Complaint  Patient presents with   Gynecologic Exam    Virna Livengood is a 56 y.o. female who presents today for a complete physical exam. She reports consuming a  she tries to eat a DM   diet. {types:19826} She generally feels {DESC; WELL/FAIRLY WELL/POORLY:18703}. She reports sleeping {DESC; WELL/FAIRLY WELL/POORLY:18703}. She {does/does not:200015} have additional problems to discuss today.    Most recent fall risk assessment:    03/31/2022   10:14 AM  [place] in the past year? 1  Number falls in past yr: 1  Injury with Fall? 1  Risk for fall due to : History of fall(s)  Follow up Falls evaluation completed;Education provided;Falls prevention discussed     Most recent depression screenings:    03/31/2022   10:14 AM 02/17/2020    3:13 PM  PHQ 2/9 Scores  PHQ - 2 Score 1 1    {VISON DENTAL STD PSA (Optional):27386}  Patient Active Problem List   Diagnosis Date Noted   Tremor of both hands 05/25/2021   Benign paroxysmal positional vertigo, left 05/25/2021   Mid back pain on right side 01/05/2021   Fall 01/05/2021   Neck pain 01/05/2021   Esophageal dysphagia 11/10/2019   Nausea and vomiting 11/10/2019   OAB (overactive bladder) 08/12/2019   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   Tinea manuum 05/05/2019   Toenail fungus 05/05/2019   Posterior vitreous degeneration, bilateral 04/14/2019   Chronic idiopathic constipation 12/24/2018   RLS (restless legs syndrome) 12/18/2017   SOB (shortness of breath) 02/02/2017   No energy 02/02/2017   Chest tightness 02/02/2017   Weakness 02/02/2017   Vitamin D insufficiency 01/30/2017   Low iron stores 01/30/2017   Fibromyalgia 12/26/2016   Left breast mass 07/12/2016   Painful breasts 06/14/2016   History of fibrocystic disease of breast 06/14/2016   HAV (hallux abducto valgus) 12/20/2015    Adductovarus rotation of toe, acquired 12/20/2015   Uncontrolled type 1 diabetes mellitus with hyperglycemia ([place]) 09/15/2015   Internal hemorrhoids 08/16/2015   Abdominal pain 06/07/2015   Mild nonproliferative diabetic retinopathy ([place]) 12/22/2014   Pseudophakia of both eyes 12/22/2014   Bipolar 1 disorder with moderate mania ([place]) 11/02/2014   Past Medical History:  Diagnosis Date   Bipolar 1 disorder ([employer])    Deafness in left ear    Depression    Diabetes mellitus without complication (HCC)    Gastroparesis    Osteoarthritis    Pain of left breast    Skin disorder    Family History  Problem Relation Age of Onset   Depression Sister    Alcohol abuse Brother    Diabetes Brother    Depression Brother    Breast cancer Maternal Aunt    Allergies  Allergen Reactions   Latex Rash and Itching    Other reaction(s): rash/itching    Amitriptyline     Other reaction(s): Other Groggy/constipation/did not think worked  Groggy/constipation/did not think worked    Pregabalin     Did not have benefit.  Falling asleep. Feeling depressed. Other reaction(s): Unknown Did not have benefit.  Falling asleep. Feeling depressed. Did not have benefit.  Falling asleep. Feeling depressed.   Tape Rash   Elavil [Amitriptyline Hcl]     Groggy/constipation/did not think worked    Lamotrigine Itching    Per [facility]  Other     Other reaction(s): Rash   Wound Dressing Adhesive       Patient Care Team: Lavada Mesi as PCP - General (Family Medicine)   Outpatient Medications Prior to Visit  Medication Sig   ALPRAZolam (XANAX) 0.5 MG tablet As needed.   ARIPiprazole (ABILIFY) 5 MG tablet Take 1 tablet by mouth daily with breakfast.   Biotin w/ Vitamins C & E (HAIR/SKIN/NAILS PO) Take by mouth.   buPROPion (WELLBUTRIN XL) 300 MG 24 hr tablet Take 450 mg by mouth daily. Patient takes 450 mg per day   Continuous Blood Gluc Sensor (DEXCOM G6 SENSOR) MISC 1 each by Does not  apply route See admin instructions. Apply 1 sensor to body once every 10 days.   doxepin (SINEQUAN) 10 MG capsule Take 10 mg by mouth at bedtime.   esomeprazole (NEXIUM) 40 MG capsule Nexium 40 mg capsule,delayed release   famotidine (PEPCID) 20 MG tablet Take 20 mg by mouth 2 (two) times daily.   fexofenadine (ALLEGRA) 180 MG tablet Take by mouth.   FLUoxetine (PROZAC) 40 MG capsule Take by mouth.   fluticasone (FLONASE) 50 MCG/ACT nasal spray Place 1 spray into both nostrils daily.   gabapentin (NEURONTIN) 300 MG capsule Take 1 capsule (300 mg total) by mouth daily.   Glucagon, rDNA, (GLUCAGON EMERGENCY) 1 MG KIT INJECT 1 MG INTO THE MUSCLE FOR SEVERE HYPOGLYCEMIA   glucose blood (FREESTYLE TEST STRIPS) test strip Check fasting blood sugar every morning and 2 hours after last two meals of the day. Check up to 3 times daily.   glucose monitoring kit (FREESTYLE) monitoring kit 1 each by Does not apply route as needed for other.   HYDROcodone-acetaminophen (NORCO/VICODIN) 5-325 MG tablet As needed.   ibuprofen (ADVIL) 800 MG tablet Take 800 mg by mouth every 8 (eight) hours as needed.   insulin aspart (NOVOLOG FLEXPEN) 100 UNIT/ML FlexPen INJECT 7 TO 8 UNITS UNDER THE SKIN THREE TIMES A DAY BEFORE MEALS   Insulin Lispro-aabc (LYUMJEV KWIKPEN) 100 UNIT/ML KwikPen Inject 4 to 10 units before each meal based on carbohydrates and glucose level   Insulin Pen Needle (SURE COMFORT PEN NEEDLES) 31G X 5 MM MISC USE 7 PEN NEEDLES PER DAY TO INJECT INSULIN   KETOSTIX strip USE TO CHECK URINE KETONES IF BLOOD SUGAR IS OVER 300 AS DIRECTED BY PHYSICIAN   LANTUS SOLOSTAR 100 UNIT/ML Solostar Pen INJECT 20 UNITS UNDER THE SKIN TWICE A DAY (Patient taking differently: She takes 8 units in the morning and 12 at night.)   LINZESS 290 MCG CAPS capsule Take 290 mcg by mouth daily.   metoCLOPramide (REGLAN) 5 MG tablet Take 5 mg by mouth 4 (four) times daily.   ondansetron (ZOFRAN) 4 MG tablet Take 4 mg by mouth every  8 (eight) hours as needed for nausea or vomiting.   pantoprazole (PROTONIX) 40 MG tablet Take 40 mg by mouth daily.   Plecanatide (TRULANCE) 3 MG TABS Take 1 tablet by mouth daily. With food.   Probiotic Product (PROBIOTIC ADVANCED PO) Take by mouth.   rosuvastatin (CRESTOR) 5 MG tablet Take 1 tablet (5 mg total) by mouth daily.   TURMERIC CURCUMIN PO Take by mouth.   FLUoxetine (PROZAC) 20 MG capsule Take 4 capsules (80 mg total) by mouth daily. (Patient not taking: Reported on 03/31/2022)   No facility-administered medications prior to visit.    ROS        Objective:     BP 123/74 (  BP Location: Left Arm, Patient Position: Sitting, Cuff Size: Normal)   Pulse 89   Temp 98.4 F (36.9 C) (Oral)   Ht $R'5\' 2"'VL$  (1.575 m)   Wt 162 lb 0.6 oz (73.5 kg)   LMP 11/25/2017   SpO2 98%   BMI 29.64 kg/m  BP Readings from Last 3 Encounters:  07/11/22 123/74  01/30/22 137/68  09/29/21 134/77   Wt Readings from Last 3 Encounters:  07/11/22 162 lb 0.6 oz (73.5 kg)  01/30/22 144 lb (65.3 kg)  09/29/21 142 lb 1.9 oz (64.5 kg)      Physical Exam       Assessment & Plan:    Routine Health Maintenance and Physical Exam  Immunization History  Administered Date(s) Administered   HPV Quadrivalent 10/07/2020   Influenza, Seasonal, Injecte, Preservative Fre 09/20/2015, 08/15/2016   Influenza,inj,Quad PF,6+ Mos 08/31/2017, 07/23/2018, 08/11/2019   Influenza-Unspecified 10/04/2007, 09/20/2015, 08/15/2016, 08/02/2018   Pneumococcal Polysaccharide-23 05/02/2019   Tdap 10/31/2018   Zoster Recombinat (Shingrix) 08/12/2019    Health Maintenance  Topic Date Due   PAP SMEAR-Modifier  12/17/2018   Zoster Vaccines- Shingrix (2 of 2) 10/07/2019   OPHTHALMOLOGY EXAM  06/04/2020   HEMOGLOBIN A1C  03/10/2022   INFLUENZA VACCINE  06/27/2022   FOOT EXAM  07/12/2022 (Originally 05/01/2020)   MAMMOGRAM  01/31/2023 (Originally 01/09/2019)   Hepatitis C Screening  04/01/2023 (Originally 11/25/1982)    HIV Screening  04/01/2023 (Originally 11/26/1979)   Diabetic kidney evaluation - GFR measurement  09/09/2022   Diabetic kidney evaluation - Urine ACR  09/29/2022   COLONOSCOPY (Pts 45-62yrs Insurance coverage will need to be confirmed)  10/18/2026   TETANUS/TDAP  10/31/2028   HPV VACCINES  Aged Out    Discussed health benefits of physical activity, and encouraged her to engage in regular exercise appropriate for her age and condition.  Problem List Items Addressed This Visit   None Visit Diagnoses     Papanicolaou smear    -  Primary   Relevant Orders   Cytology - PAP   UTI symptoms       Relevant Orders   Urine Culture      No follow-ups on file.     Iran Planas, PA-C

## 2022-07-11 NOTE — Patient Instructions (Signed)

## 2022-07-12 LAB — CYTOLOGY - PAP
Comment: NEGATIVE
Diagnosis: NEGATIVE
High risk HPV: NEGATIVE

## 2022-07-12 MED ORDER — MIRABEGRON ER 50 MG PO TB24: 50.0000 mg | ORAL_TABLET | Freq: Every day | ORAL | 1 refills | Status: DC

## 2022-07-12 NOTE — Progress Notes (Signed)
Sheryl Matthews,   Negative for HPV. No abnormal cells detected. Repeat pap in 5 years.

## 2022-07-13 ENCOUNTER — Telehealth: Payer: Self-pay | Admitting: Neurology

## 2022-07-13 NOTE — Telephone Encounter (Signed)
Patient called yesterday concerned about urine results. I let her know this was sent for culture and we would follow up with results once culture completed.   Patient left another VM today concerned about results. Culture still not back.

## 2022-07-14 ENCOUNTER — Encounter: Payer: Self-pay | Admitting: Physician Assistant

## 2022-07-14 LAB — URINE CULTURE
MICRO NUMBER:: 13787207
SPECIMEN QUALITY:: ADEQUATE

## 2022-07-14 NOTE — Telephone Encounter (Signed)
Urine culture resulted and patient responded via mychart.

## 2022-07-14 NOTE — Progress Notes (Signed)
No worrisome bacterial accumulation. I sent myrbetriq for overactive bladder symptoms to try.

## 2022-07-27 DIAGNOSIS — Z978 Presence of other specified devices: Secondary | ICD-10-CM | POA: Diagnosis not present

## 2022-07-27 DIAGNOSIS — E1065 Type 1 diabetes mellitus with hyperglycemia: Secondary | ICD-10-CM | POA: Diagnosis not present

## 2022-08-03 DIAGNOSIS — H524 Presbyopia: Secondary | ICD-10-CM | POA: Diagnosis not present

## 2022-08-03 DIAGNOSIS — E109 Type 1 diabetes mellitus without complications: Secondary | ICD-10-CM | POA: Diagnosis not present

## 2022-08-03 DIAGNOSIS — H0100A Unspecified blepharitis right eye, upper and lower eyelids: Secondary | ICD-10-CM | POA: Diagnosis not present

## 2022-08-03 DIAGNOSIS — H2640 Unspecified secondary cataract: Secondary | ICD-10-CM | POA: Diagnosis not present

## 2022-08-03 DIAGNOSIS — Z961 Presence of intraocular lens: Secondary | ICD-10-CM | POA: Diagnosis not present

## 2022-08-03 DIAGNOSIS — H04123 Dry eye syndrome of bilateral lacrimal glands: Secondary | ICD-10-CM | POA: Diagnosis not present

## 2022-08-03 DIAGNOSIS — H0100B Unspecified blepharitis left eye, upper and lower eyelids: Secondary | ICD-10-CM | POA: Diagnosis not present

## 2022-08-03 DIAGNOSIS — H43813 Vitreous degeneration, bilateral: Secondary | ICD-10-CM | POA: Diagnosis not present

## 2022-08-04 LAB — HM DIABETES EYE EXAM

## 2022-08-08 DIAGNOSIS — F332 Major depressive disorder, recurrent severe without psychotic features: Secondary | ICD-10-CM | POA: Diagnosis not present

## 2022-08-08 DIAGNOSIS — F3181 Bipolar II disorder: Secondary | ICD-10-CM | POA: Diagnosis not present

## 2022-08-15 DIAGNOSIS — F4310 Post-traumatic stress disorder, unspecified: Secondary | ICD-10-CM | POA: Diagnosis not present

## 2022-08-15 DIAGNOSIS — F411 Generalized anxiety disorder: Secondary | ICD-10-CM | POA: Diagnosis not present

## 2022-08-15 DIAGNOSIS — Z79899 Other long term (current) drug therapy: Secondary | ICD-10-CM | POA: Diagnosis not present

## 2022-08-15 DIAGNOSIS — F314 Bipolar disorder, current episode depressed, severe, without psychotic features: Secondary | ICD-10-CM | POA: Diagnosis not present

## 2022-08-16 ENCOUNTER — Telehealth (INDEPENDENT_AMBULATORY_CARE_PROVIDER_SITE_OTHER): Payer: Medicare Other | Admitting: Physician Assistant

## 2022-08-16 ENCOUNTER — Encounter: Payer: Self-pay | Admitting: Physician Assistant

## 2022-08-16 VITALS — Temp 98.0°F | Ht 62.0 in | Wt 151.0 lb

## 2022-08-16 DIAGNOSIS — J329 Chronic sinusitis, unspecified: Secondary | ICD-10-CM | POA: Diagnosis not present

## 2022-08-16 DIAGNOSIS — J40 Bronchitis, not specified as acute or chronic: Secondary | ICD-10-CM

## 2022-08-16 MED ORDER — AMOXICILLIN-POT CLAVULANATE 875-125 MG PO TABS: 1.0000 | ORAL_TABLET | Freq: Two times a day (BID) | ORAL | 0 refills | Status: DC

## 2022-08-16 NOTE — Progress Notes (Signed)
Started one week ago Congestion/cough Was having fevers - gone now  Taking: Theraflu Mucinex Safetussin  Sinex  Vicks vapor rub

## 2022-08-16 NOTE — Progress Notes (Signed)
..Virtual Visit via Telephone Note  I connected with Sheryl Matthews on 08/18/22 at 10:10 AM EDT by telephone and verified that I am speaking with the correct person using two identifiers.  Location: Patient: home Provider: clinic  .Marland KitchenParticipating in visit:  Patient: Sheryl Matthews Provider: Iran Planas PA-C Provider in training: Elsie Lincoln PA-S   I discussed the limitations, risks, security and privacy concerns of performing an evaluation and management service by telephone and the availability of in person appointments. I also discussed with the patient that there may be a patient responsible charge related to this service. The patient expressed understanding and agreed to proceed.   History of Present Illness: Pt is a 56 yo female with t1DM who calls in with URI symptoms for the last 10 days. Her symptoms started with congestion, cough, fever and have progressively gotten worse. She is taking mucinex, tussin, sinex, vicks vapor rub with little benefit. She had fevers at beginning but none now. She denies any SOB or body aches. She never tested for covid. She is having a lot of productive cough and sinus pressure.   .. 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[place]) 12/22/2014   Pseudophakia of both eyes 12/22/2014   Abdominal pain 06/07/2015   Internal hemorrhoids 08/16/2015   Uncontrolled type 1 diabetes mellitus with hyperglycemia (HCC) 09/15/2015   HAV (hallux abducto valgus) 12/20/2015   Adductovarus rotation of toe, acquired 12/20/2015   Painful breasts 06/14/2016   History of fibrocystic disease of breast 06/14/2016   Left breast mass 07/12/2016   Fibromyalgia 12/26/2016   Vitamin D insufficiency 01/30/2017   Low iron stores 01/30/2017   SOB (shortness of breath) 02/02/2017   No energy 02/02/2017   Chest tightness 02/02/2017   Weakness 02/02/2017   RLS (restless  legs syndrome) 12/18/2017   Chronic idiopathic constipation 12/24/2018   Posterior vitreous degeneration, bilateral 04/14/2019   Tinea manuum 05/05/2019   Toenail fungus 05/05/2019   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   OAB (overactive bladder) 08/12/2019   Esophageal dysphagia 11/10/2019   Nausea and vomiting 11/10/2019   Mid back pain on right side 01/05/2021   Fall 01/05/2021   Neck pain 01/05/2021   Tremor of both hands 05/25/2021   Benign paroxysmal positional vertigo, left 05/25/2021   Resolved Ambulatory Problems    Diagnosis Date Noted   Type I diabetes mellitus with peripheral autonomic neuropathy ([place]) 11/02/2014   Past Medical History:  Diagnosis Date   Bipolar 1 disorder ([place])    Deafness in left ear    Depression    Diabetes mellitus without complication (HCC)    Gastroparesis    Osteoarthritis    Pain of left breast    Skin disorder       Observations/Objective: No acute distress Normal mood  No labored breathing  .Marland Kitchen Today's Vitals   08/16/22 0959  Temp: 98 F (36.7 C)  TempSrc: Oral  Weight: 151 lb (68.5 kg)  Height: '5\' 2"'$  (1.575 m)   Body mass index is 27.62 kg/m.    Assessment and Plan: Marland KitchenMarland KitchenSharion was seen today for cough.  Diagnoses and all orders for this visit:  Sinobronchitis -     amoxicillin-clavulanate (AUGMENTIN) 875-125 MG tablet; Take 1 tablet by mouth 2 (two) times daily. For 10 days.  Other orders -     Discontinue: amoxicillin-clavulanate (AUGMENTIN) 875-125 MG tablet; Take 1 tablet by mouth 2 (two)  times daily.  10 days of symptoms and high risk for infection with diabetes Sent augmentin for 10 days Discussed symptomatic care Avoid prednisone due to sugar increase Follow up as needed or if symptoms worsen or persist.    Follow Up Instructions:    I discussed the assessment and treatment plan with the patient. The patient was provided an opportunity to ask questions and all were answered. The patient agreed with  the plan and demonstrated an understanding of the instructions.   The patient was advised to call back or seek an in-person evaluation if the symptoms worsen or if the condition fails to improve as anticipated.  I provided 7 minutes of non-face-to-face time during this encounter.   Iran Planas, PA-C

## 2022-08-18 ENCOUNTER — Telehealth (INDEPENDENT_AMBULATORY_CARE_PROVIDER_SITE_OTHER): Payer: Medicare Other | Admitting: Physician Assistant

## 2022-08-18 ENCOUNTER — Encounter: Payer: Self-pay | Admitting: Physician Assistant

## 2022-08-18 DIAGNOSIS — Z794 Long term (current) use of insulin: Secondary | ICD-10-CM

## 2022-08-18 DIAGNOSIS — J40 Bronchitis, not specified as acute or chronic: Secondary | ICD-10-CM | POA: Diagnosis not present

## 2022-08-18 DIAGNOSIS — J329 Chronic sinusitis, unspecified: Secondary | ICD-10-CM

## 2022-08-18 DIAGNOSIS — E1065 Type 1 diabetes mellitus with hyperglycemia: Secondary | ICD-10-CM | POA: Diagnosis not present

## 2022-08-18 MED ORDER — BENZONATATE 200 MG PO CAPS: 200.0000 mg | ORAL_CAPSULE | Freq: Three times a day (TID) | ORAL | 0 refills | Status: DC | PRN

## 2022-08-18 MED ORDER — HYDROCOD POLI-CHLORPHE POLI ER 10-8 MG/5ML PO SUER: 5.0000 mL | Freq: Two times a day (BID) | ORAL | 0 refills | Status: DC | PRN

## 2022-08-18 MED ORDER — PREDNISONE 50 MG PO TABS: ORAL_TABLET | ORAL | 0 refills | Status: DC

## 2022-08-18 NOTE — Progress Notes (Signed)
..Virtual Visit via Telephone Note  I connected with Sheryl Matthews on 08/18/22 at  8:50 AM EDT by telephone and verified that I am speaking with the correct person using two identifiers.  Location: Patient: home Provider: clinic  .Marland KitchenParticipating in visit:  Patient: Sheryl Matthews Provider: Iran Planas PA-C Provider in training: Elsie Lincoln PA-S    I discussed the limitations, risks, security and privacy concerns of performing an evaluation and management service by telephone and the availability of in person appointments. I also discussed with the patient that there may be a patient responsible charge related to this service. The patient expressed understanding and agreed to proceed.   History of Present Illness: Pt is a 56 yo female with T1DM who continues to have cough that is productive despite starting augmentin. She denies any SOB or fever. No covid testing. She has been sick for over 10 days. No hx of lung disease.   .. 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[place]) 12/22/2014   Pseudophakia of both eyes 12/22/2014   Abdominal pain 06/07/2015   Internal hemorrhoids 08/16/2015   Uncontrolled type 1 diabetes mellitus with hyperglycemia (HCC) 09/15/2015   HAV (hallux abducto valgus) 12/20/2015   Adductovarus rotation of toe, acquired 12/20/2015   Painful breasts 06/14/2016   History of fibrocystic disease of breast 06/14/2016   Left breast mass 07/12/2016   Fibromyalgia 12/26/2016   Vitamin D insufficiency 01/30/2017   Low iron stores 01/30/2017   SOB (shortness of breath) 02/02/2017   No energy 02/02/2017   Chest tightness 02/02/2017   Weakness 02/02/2017   RLS (restless legs syndrome) 12/18/2017   Chronic idiopathic constipation 12/24/2018   Posterior vitreous degeneration, bilateral 04/14/2019   Tinea manuum 05/05/2019   Toenail fungus 05/05/2019    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   OAB (overactive bladder) 08/12/2019   Esophageal dysphagia 11/10/2019   Nausea and vomiting 11/10/2019   Mid back pain on right side 01/05/2021   Fall 01/05/2021   Neck pain 01/05/2021   Tremor of both hands 05/25/2021   Benign paroxysmal positional vertigo, left 05/25/2021   Resolved Ambulatory Problems    Diagnosis Date Noted   Type I diabetes mellitus with peripheral autonomic neuropathy ([place]) 11/02/2014   Past Medical History:  Diagnosis Date   Bipolar 1 disorder ([place])    Deafness in left ear    Depression    Diabetes mellitus without complication (HCC)    Gastroparesis    Osteoarthritis    Pain of left breast    Skin disorder        Observations/Objective: No acute distress Productive cough with no labored breathing  No vitals   Assessment and Plan: Marland KitchenMarland KitchenSheronda was seen today for sinus problem.  Diagnoses and all orders for this visit:  Sinobronchitis -     benzonatate (TESSALON) 200 MG capsule; Take 1 capsule (200 mg total) by mouth 3 (three) times daily as needed. -     chlorpheniramine-HYDROcodone (TUSSIONEX) 10-8 MG/5ML; Take 5 mLs by mouth every 12 (twelve) hours as needed for cough (cough, will cause drowsiness.). -     predniSONE (DELTASONE) 50 MG tablet; One tab PO daily for 5 days.  Uncontrolled type 1 diabetes mellitus with hyperglycemia (HCC)   Continue augmentin Start prednisone for 5 days Tessalon for cough during day Tussionex for cough at bedtime May need more insulin for elevated sugars on prednisone Rest and hydrate  No red flag symptoms Follow up as needed if symptoms persist or worsen get cXR and come in for visit.    Follow Up Instructions:    I discussed the assessment and treatment plan with the patient. The patient was provided an opportunity to ask questions and all were answered. The patient agreed with the plan and demonstrated an understanding of the instructions.   The patient was  advised to call back or seek an in-person evaluation if the symptoms worsen or if the condition fails to improve as anticipated.  I provided 5 minutes of non-face-to-face time during this encounter.   Iran Planas, PA-C

## 2022-08-22 ENCOUNTER — Telehealth: Payer: Self-pay

## 2022-08-22 NOTE — Telephone Encounter (Signed)
Patient called and left voicemail. I tried calling patient back but no answer.

## 2022-08-23 ENCOUNTER — Other Ambulatory Visit: Payer: Self-pay | Admitting: Medical-Surgical

## 2022-08-29 DIAGNOSIS — R7309 Other abnormal glucose: Secondary | ICD-10-CM | POA: Diagnosis not present

## 2022-08-29 DIAGNOSIS — F3181 Bipolar II disorder: Secondary | ICD-10-CM | POA: Diagnosis not present

## 2022-09-14 ENCOUNTER — Other Ambulatory Visit: Payer: Self-pay | Admitting: Neurology

## 2022-09-14 MED ORDER — FAMOTIDINE 20 MG PO TABS: 20.0000 mg | ORAL_TABLET | Freq: Two times a day (BID) | ORAL | 1 refills | Status: DC

## 2022-09-19 DIAGNOSIS — R7309 Other abnormal glucose: Secondary | ICD-10-CM | POA: Diagnosis not present

## 2022-09-19 DIAGNOSIS — F3181 Bipolar II disorder: Secondary | ICD-10-CM | POA: Diagnosis not present

## 2022-09-20 ENCOUNTER — Telehealth: Payer: Self-pay | Admitting: Physician Assistant

## 2022-09-20 DIAGNOSIS — Z1231 Encounter for screening mammogram for malignant neoplasm of breast: Secondary | ICD-10-CM

## 2022-09-20 NOTE — Telephone Encounter (Signed)
This was ordered back in May. Will follow up with patient.

## 2022-09-20 NOTE — Telephone Encounter (Signed)
Patient called stating she previously had a mammogram she thinks in [place] but wants to have it done here in [place] . Can we do that here?

## 2022-09-21 NOTE — Telephone Encounter (Signed)
Called patient back, LVM letting her know screening mammograms can be done in [place]. If she needs a diagnostic for past abnormal mammograms that would have to be done in [place]. To call back with any questions.

## 2022-09-25 DIAGNOSIS — K3184 Gastroparesis: Secondary | ICD-10-CM | POA: Diagnosis not present

## 2022-09-25 DIAGNOSIS — K581 Irritable bowel syndrome with constipation: Secondary | ICD-10-CM | POA: Diagnosis not present

## 2022-09-25 DIAGNOSIS — K219 Gastro-esophageal reflux disease without esophagitis: Secondary | ICD-10-CM | POA: Diagnosis not present

## 2022-09-25 DIAGNOSIS — K589 Irritable bowel syndrome without diarrhea: Secondary | ICD-10-CM | POA: Diagnosis not present

## 2022-09-28 DIAGNOSIS — Z1231 Encounter for screening mammogram for malignant neoplasm of breast: Secondary | ICD-10-CM | POA: Diagnosis not present

## 2022-09-28 LAB — HM MAMMOGRAPHY

## 2022-10-31 ENCOUNTER — Encounter: Payer: Self-pay | Admitting: Physician Assistant

## 2022-11-10 DIAGNOSIS — R109 Unspecified abdominal pain: Secondary | ICD-10-CM | POA: Diagnosis not present

## 2022-11-10 DIAGNOSIS — Z794 Long term (current) use of insulin: Secondary | ICD-10-CM | POA: Diagnosis not present

## 2022-11-10 DIAGNOSIS — E1165 Type 2 diabetes mellitus with hyperglycemia: Secondary | ICD-10-CM | POA: Diagnosis not present

## 2022-11-10 DIAGNOSIS — K5900 Constipation, unspecified: Secondary | ICD-10-CM | POA: Diagnosis not present

## 2022-11-22 DIAGNOSIS — K581 Irritable bowel syndrome with constipation: Secondary | ICD-10-CM | POA: Diagnosis not present

## 2022-11-22 DIAGNOSIS — K219 Gastro-esophageal reflux disease without esophagitis: Secondary | ICD-10-CM | POA: Diagnosis not present

## 2022-11-22 DIAGNOSIS — K3184 Gastroparesis: Secondary | ICD-10-CM | POA: Diagnosis not present

## 2022-11-28 ENCOUNTER — Other Ambulatory Visit: Payer: Self-pay | Admitting: Physician Assistant

## 2022-12-14 ENCOUNTER — Other Ambulatory Visit: Payer: Self-pay | Admitting: Medical-Surgical

## 2022-12-14 DIAGNOSIS — R251 Tremor, unspecified: Secondary | ICD-10-CM

## 2022-12-19 DIAGNOSIS — Z79899 Other long term (current) drug therapy: Secondary | ICD-10-CM | POA: Diagnosis not present

## 2022-12-19 DIAGNOSIS — F4310 Post-traumatic stress disorder, unspecified: Secondary | ICD-10-CM | POA: Diagnosis not present

## 2022-12-19 DIAGNOSIS — F314 Bipolar disorder, current episode depressed, severe, without psychotic features: Secondary | ICD-10-CM | POA: Diagnosis not present

## 2022-12-19 DIAGNOSIS — F411 Generalized anxiety disorder: Secondary | ICD-10-CM | POA: Diagnosis not present

## 2023-01-01 ENCOUNTER — Encounter: Payer: Self-pay | Admitting: Physician Assistant

## 2023-01-01 DIAGNOSIS — E1065 Type 1 diabetes mellitus with hyperglycemia: Secondary | ICD-10-CM | POA: Diagnosis not present

## 2023-01-01 LAB — HEMOGLOBIN A1C: Hemoglobin A1C: 8.9

## 2023-01-03 ENCOUNTER — Ambulatory Visit (INDEPENDENT_AMBULATORY_CARE_PROVIDER_SITE_OTHER): Payer: Medicare Other | Admitting: Physician Assistant

## 2023-01-03 ENCOUNTER — Encounter: Payer: Self-pay | Admitting: Physician Assistant

## 2023-01-03 VITALS — BP 132/69 | HR 81 | Ht 62.0 in | Wt 155.1 lb

## 2023-01-03 DIAGNOSIS — K5909 Other constipation: Secondary | ICD-10-CM

## 2023-01-03 DIAGNOSIS — E1065 Type 1 diabetes mellitus with hyperglycemia: Secondary | ICD-10-CM | POA: Diagnosis not present

## 2023-01-03 DIAGNOSIS — F3112 Bipolar disorder, current episode manic without psychotic features, moderate: Secondary | ICD-10-CM | POA: Diagnosis not present

## 2023-01-03 DIAGNOSIS — K3184 Gastroparesis: Secondary | ICD-10-CM

## 2023-01-03 NOTE — Progress Notes (Signed)
Established Patient Office Visit  Subjective   Patient ID: Sheryl Matthews, female    DOB: Sep 08, 1966  Age: 57 y.o. MRN: [id number]  Chief Complaint  Patient presents with   Follow-up    HPI Pt is a 57 yo female with T1DM, Bipolar, HLD, chronic constipation who presents to the clinic for follow up.   She is working with endocrinology, psychiatry and gastroenterology. She has been doing better with her diabetes control. She is walking and exercising more. She is down 10lbs. This has also helped her mood.   She is frustrated with her constipation. She has failed trulance and linzess. She is on reglan, doxepin, protonix and pepcid. Mag Citrate is the only thing that helps her have a bowel movement.     Patient Active Problem List   Diagnosis Date Noted   Gastroparesis 01/03/2023   Tremor of both hands 05/25/2021   Benign paroxysmal positional vertigo, left 05/25/2021   Mid back pain on right side 01/05/2021   Fall 01/05/2021   Neck pain 01/05/2021   Esophageal dysphagia 11/10/2019   Nausea and vomiting 11/10/2019   OAB (overactive bladder) 08/12/2019   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   Tinea manuum 05/05/2019   Toenail fungus 05/05/2019   Posterior vitreous degeneration, bilateral 04/14/2019   Chronic constipation 12/24/2018   RLS (restless legs syndrome) 12/18/2017   SOB (shortness of breath) 02/02/2017   No energy 02/02/2017   Chest tightness 02/02/2017   Weakness 02/02/2017   Vitamin D insufficiency 01/30/2017   Low iron stores 01/30/2017   Fibromyalgia 12/26/2016   Left breast mass 07/12/2016   Painful breasts 06/14/2016   History of fibrocystic disease of breast 06/14/2016   HAV (hallux abducto valgus) 12/20/2015   Adductovarus rotation of toe, acquired 12/20/2015   Uncontrolled type 1 diabetes mellitus with hyperglycemia ([place]) 09/15/2015   Internal hemorrhoids 08/16/2015   Abdominal pain 06/07/2015   Mild nonproliferative diabetic retinopathy ([place])  12/22/2014   Pseudophakia of both eyes 12/22/2014   Bipolar 1 disorder with moderate mania ([place]) 11/02/2014   Past Medical History:  Diagnosis Date   Bipolar 1 disorder ([place])    Deafness in left ear    Depression    Diabetes mellitus without complication (HCC)    Gastroparesis    Osteoarthritis    Pain of left breast    Skin disorder    Family History  Problem Relation Age of Onset   Depression Sister    Alcohol abuse Brother    Diabetes Brother    Depression Brother    Breast cancer Maternal Aunt    Allergies  Allergen Reactions   Latex Rash and Itching    Other reaction(s): rash/itching    Amitriptyline     Other reaction(s): Other Groggy/constipation/did not think worked  Groggy/constipation/did not think worked    Pregabalin     Did not have benefit.  Falling asleep. Feeling depressed. Other reaction(s): Unknown Did not have benefit.  Falling asleep. Feeling depressed. Did not have benefit.  Falling asleep. Feeling depressed.   Tape Rash   Elavil [Amitriptyline Hcl]     Groggy/constipation/did not think worked    Lamotrigine Itching    Per [facility]   Other     Other reaction(s): Rash   Wound Dressing Adhesive       Review of Systems  All other systems reviewed and are negative.     Objective:     BP 132/69 (BP Location: Left Arm, Patient Position: Sitting, Cuff Size: Small)  Pulse 81   Ht 5' 2"$  (1.575 m)   Wt 155 lb 1.9 oz (70.4 kg)   LMP 11/25/2017   SpO2 99%   BMI 28.37 kg/m  BP Readings from Last 3 Encounters:  01/03/23 132/69  07/11/22 123/74  01/30/22 137/68   Wt Readings from Last 3 Encounters:  01/03/23 155 lb 1.9 oz (70.4 kg)  08/16/22 151 lb (68.5 kg)  07/11/22 162 lb 0.6 oz (73.5 kg)      Physical Exam Constitutional:      Appearance: Normal appearance.  Cardiovascular:     Rate and Rhythm: Normal rate and regular rhythm.  Pulmonary:     Effort: Pulmonary effort is normal.     Breath sounds: Normal breath sounds.   Musculoskeletal:     Right lower leg: No edema.     Left lower leg: No edema.  Neurological:     General: No focal deficit present.     Mental Status: She is alert and oriented to person, place, and time.  Psychiatric:        Mood and Affect: Mood normal.       Assessment & Plan:  Marland KitchenMarland KitchenPromyse was seen today for follow-up.  Diagnoses and all orders for this visit:  Gastroparesis -     Ambulatory referral to Gastroenterology  Chronic constipation -     Ambulatory referral to Gastroenterology  Uncontrolled type 1 diabetes mellitus with hyperglycemia ([place])  Bipolar 1 disorder with moderate mania ([place])   T1DM managed by endocrinology.  Constipation managed by gastroenterology. Request 2nd opinion.  Bipolar managed by Psychiatry.   Vitals look good today.     Iran Planas, PA-C

## 2023-01-08 DIAGNOSIS — K581 Irritable bowel syndrome with constipation: Secondary | ICD-10-CM | POA: Diagnosis not present

## 2023-01-08 DIAGNOSIS — K3184 Gastroparesis: Secondary | ICD-10-CM | POA: Diagnosis not present

## 2023-01-15 ENCOUNTER — Ambulatory Visit: Payer: Medicare Other | Admitting: Physician Assistant

## 2023-01-30 DIAGNOSIS — F314 Bipolar disorder, current episode depressed, severe, without psychotic features: Secondary | ICD-10-CM | POA: Diagnosis not present

## 2023-01-30 DIAGNOSIS — Z79899 Other long term (current) drug therapy: Secondary | ICD-10-CM | POA: Diagnosis not present

## 2023-01-30 DIAGNOSIS — F411 Generalized anxiety disorder: Secondary | ICD-10-CM | POA: Diagnosis not present

## 2023-01-30 DIAGNOSIS — F4310 Post-traumatic stress disorder, unspecified: Secondary | ICD-10-CM | POA: Diagnosis not present

## 2023-02-06 ENCOUNTER — Ambulatory Visit (INDEPENDENT_AMBULATORY_CARE_PROVIDER_SITE_OTHER): Payer: Medicare Other | Admitting: Physician Assistant

## 2023-02-06 DIAGNOSIS — Z79899 Other long term (current) drug therapy: Secondary | ICD-10-CM

## 2023-02-06 DIAGNOSIS — H60391 Other infective otitis externa, right ear: Secondary | ICD-10-CM | POA: Diagnosis not present

## 2023-02-06 DIAGNOSIS — R251 Tremor, unspecified: Secondary | ICD-10-CM | POA: Diagnosis not present

## 2023-02-06 MED ORDER — OFLOXACIN 0.3 % OT SOLN: 10.0000 [drp] | Freq: Every day | OTIC | 0 refills | Status: DC

## 2023-02-06 NOTE — Patient Instructions (Signed)
Tremor A tremor is trembling or shaking that you cannot control. Most tremors affect the hands or arms. Tremors can also affect the head, vocal cords, face, and other parts of the body. There are many types of tremors. Common types include: Essential tremor. These usually occur in people older than 40. This type of tremor may run in families and can happen in otherwise healthy people. Resting tremor. These occur when the muscles are at rest, such as when your hands are resting in your lap. People with Parkinson's disease often have resting tremors. Postural tremor. These occur when you try to hold a pose, such as keeping your hands outstretched. Kinetic tremor. These occur during purposeful movement, such as trying to touch a finger to your nose. Task-specific tremor. These may occur when you do certain tasks such as writing, speaking, or standing. Psychogenic tremor. These are greatly reduced or go away when you are distracted. These tremors happen due to underlying stress or psychiatric disease. They can happen in people of all ages. Some types of tremors have no known cause. Tremors can also be a symptom of nervous system problems (neurological disorders) that may occur with aging. Some tremors go away with treatment, while others do not. Follow these instructions at home: Lifestyle     If you drink alcohol: Limit how much you have to: 0-1 drink a day for women who are not pregnant. 0-2 drinks a day for men. Know how much alcohol is in a drink. In the [place] one drink equals one 12 oz bottle of beer (355 mL), one 5 oz glass of wine (148 mL), or one 1 oz glass of hard liquor (44 mL). Do not use any products that contain nicotine or tobacco. These products include cigarettes, chewing tobacco, and vaping devices, such as e-cigarettes. If you need help quitting, ask your health care provider. Avoid extreme heat and extreme cold. Limit your caffeine intake, as told by your health care  provider. Try to get 8 hours of sleep each night. Find ways to manage your stress, such as meditation or yoga. General instructions Take over-the-counter and prescription medicines only as told by your health care provider. Keep all follow-up visits. This is important. Contact a health care provider if: You develop a tremor after starting a new medicine. You have a tremor along with other symptoms such as: Numbness. Tingling. Pain. Weakness. Your tremor gets worse. Your tremor interferes with your day-to-day life. Summary A tremor is trembling or shaking that you cannot control. Most tremors affect the hands or arms. Some types of tremors have no known cause. Others may be a symptom of nervous system problems (neurological disorders). Make sure you discuss any tremors you have with your health care provider. This information is not intended to replace advice given to you by your health care provider. Make sure you discuss any questions you have with your health care provider. Document Revised: 09/02/2021 Document Reviewed: 09/02/2021 Elsevier Patient Education  2023 Elsevier Inc.  

## 2023-02-06 NOTE — Progress Notes (Signed)
Established Patient Office Visit  Subjective   Patient ID: Sheryl Matthews, female    DOB: 1966/09/23  Age: 57 y.o. MRN: [id number]  Chief Complaint  Patient presents with   Tremors    HPI Pt is a 57 yo female with Bipolar 1 with severe treatment resistive depression, T1DM, fibromyalgia, OAB who presents to the clinic concerned about her tremor of bilateral hands and legs.   She noticed the tremor about a year ago when abilify was started. 01/2022 referral was made to neurologist but patient did not go to appt. Her psychiatrist is now adjusting medications for mood and tremor is really bad. Currently reducing abilify and increasing effexor and klonapin per patient. She has had ECT done with benefit in the last few months. Her tremor is the worst in hands and with intention but now noticeable at rest. Worse when she gets nervous. Her gait has not changed and she has not fallen. She is worried about parkinson disease.   No family hx of movement disorder   Having pain in right ear for the last few days. Noticed some discharge from right ear. No fever, chills, body aches. No sinus pressure, ST.   .. 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HCC) 12/22/2014   Pseudophakia of both eyes 12/22/2014   Abdominal pain 06/07/2015   Internal hemorrhoids 08/16/2015   Uncontrolled type 1 diabetes mellitus with hyperglycemia (HCC) 09/15/2015   HAV (hallux abducto valgus) 12/20/2015   Adductovarus rotation of toe, acquired 12/20/2015   Painful breasts 06/14/2016   History of fibrocystic disease of breast 06/14/2016   Left breast mass 07/12/2016   Fibromyalgia 12/26/2016   Vitamin D insufficiency 01/30/2017   Low iron stores 01/30/2017   SOB (shortness of breath) 02/02/2017   No energy 02/02/2017   Chest tightness 02/02/2017   Weakness 02/02/2017   RLS (restless legs syndrome)  12/18/2017   Chronic constipation 12/24/2018   Posterior vitreous degeneration, bilateral 04/14/2019   Tinea manuum 05/05/2019   Toenail fungus 05/05/2019   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   OAB (overactive bladder) 08/12/2019   Esophageal dysphagia 11/10/2019   Nausea and vomiting 11/10/2019   Mid back pain on right side 01/05/2021   Fall 01/05/2021   Neck pain 01/05/2021   Tremor of both hands 05/25/2021   Benign paroxysmal positional vertigo, left 05/25/2021   Gastroparesis 01/03/2023   Resolved Ambulatory Problems    Diagnosis Date Noted   Type I diabetes mellitus with peripheral autonomic neuropathy (HCC) 11/02/2014   Past Medical History:  Diagnosis Date   Bipolar 1 disorder (HCC)    Deafness in left ear    Depression    Diabetes mellitus without complication (HCC)    Osteoarthritis    Pain of left breast    Skin disorder      ROS See HPI>    Objective:     LMP 11/25/2017  BP Readings from Last 3 Encounters:  01/03/23 132/69  07/11/22 123/74  01/30/22 137/68   Wt Readings from Last 3 Encounters:  01/03/23 155 lb 1.9 oz (70.4 kg)  08/16/22 151 lb (68.5 kg)  07/11/22 162 lb 0.6 oz (73.5 kg)    ..    01/03/2023    2:04 PM 07/11/2022    3:15 PM 03/31/2022   10:14 AM 02/17/2020    3:13 PM 01/06/2020    1:11 PM  Depression screen PHQ  2/9  Decreased Interest 0 0 0 0 1  Down, Depressed, Hopeless 0 0 1 1 1   PHQ - 2 Score 0 0 1 1 2   Altered sleeping     2  Tired, decreased energy     2  Change in appetite     2  Feeling bad or failure about yourself      0  Trouble concentrating     1  Moving slowly or fidgety/restless     1  Suicidal thoughts     0  PHQ-9 Score     10  Difficult doing work/chores     Somewhat difficult     Physical Exam Constitutional:      Appearance: Normal appearance.  HENT:     Head: Normocephalic.     Right Ear: Tympanic membrane normal. There is no impacted cerumen.     Left Ear: Tympanic membrane, ear canal and external  ear normal. There is no impacted cerumen.     Ears:     Comments: Right TM erythematous with white/yellow purulent discharge in canal    Nose: Nose normal.  Eyes:     Pupils: Pupils are equal, round, and reactive to light.  Cardiovascular:     Rate and Rhythm: Normal rate.  Pulmonary:     Effort: Pulmonary effort is normal.  Musculoskeletal:     Right lower leg: No edema.     Left lower leg: No edema.     Comments: Tremor of hands at rest and worse with intention Strength 5/5 of upper extremity and hands Bouncing of both legs that stop when walking No gait changes or shuffling of feet  Neurological:     General: No focal deficit present.     Mental Status: She is alert and oriented to person, place, and time.  Psychiatric:     Comments: anxious          Assessment & Plan:  Marland KitchenMarland KitchenNaysha was seen today for tremors.  Diagnoses and all orders for this visit:  Tremor of both hands -     Ambulatory referral to Neurology  Other infective acute otitis externa of right ear -     ofloxacin (FLOXIN) 0.3 % OTIC solution; Place 10 drops into the left ear daily. For 7 days.  Polypharmacy -     Ambulatory referral to Neurology   Suspect tremor is more due to medication  Will get neurology consult Floxin for external right ear infection Follow up as needed or if symptoms persist.    Tandy Gaw, PA-C

## 2023-02-07 ENCOUNTER — Encounter: Payer: Self-pay | Admitting: Physician Assistant

## 2023-02-09 ENCOUNTER — Ambulatory Visit: Payer: Medicare Other | Admitting: Physician Assistant

## 2023-03-05 ENCOUNTER — Other Ambulatory Visit: Payer: Self-pay | Admitting: Physician Assistant

## 2023-03-27 DIAGNOSIS — F4310 Post-traumatic stress disorder, unspecified: Secondary | ICD-10-CM | POA: Diagnosis not present

## 2023-03-27 DIAGNOSIS — F3162 Bipolar disorder, current episode mixed, moderate: Secondary | ICD-10-CM | POA: Diagnosis not present

## 2023-03-27 DIAGNOSIS — F411 Generalized anxiety disorder: Secondary | ICD-10-CM | POA: Diagnosis not present

## 2023-04-16 DIAGNOSIS — E1042 Type 1 diabetes mellitus with diabetic polyneuropathy: Secondary | ICD-10-CM | POA: Diagnosis not present

## 2023-04-16 DIAGNOSIS — F32A Depression, unspecified: Secondary | ICD-10-CM | POA: Diagnosis not present

## 2023-04-16 DIAGNOSIS — F3181 Bipolar II disorder: Secondary | ICD-10-CM | POA: Diagnosis not present

## 2023-04-16 DIAGNOSIS — K3184 Gastroparesis: Secondary | ICD-10-CM | POA: Diagnosis not present

## 2023-04-16 DIAGNOSIS — Z9181 History of falling: Secondary | ICD-10-CM | POA: Diagnosis not present

## 2023-04-16 DIAGNOSIS — F332 Major depressive disorder, recurrent severe without psychotic features: Secondary | ICD-10-CM | POA: Diagnosis not present

## 2023-04-16 DIAGNOSIS — Z794 Long term (current) use of insulin: Secondary | ICD-10-CM | POA: Diagnosis not present

## 2023-04-16 DIAGNOSIS — R45851 Suicidal ideations: Secondary | ICD-10-CM | POA: Diagnosis not present

## 2023-04-16 DIAGNOSIS — Z608 Other problems related to social environment: Secondary | ICD-10-CM | POA: Diagnosis not present

## 2023-04-16 DIAGNOSIS — Z79899 Other long term (current) drug therapy: Secondary | ICD-10-CM | POA: Diagnosis not present

## 2023-04-16 DIAGNOSIS — R739 Hyperglycemia, unspecified: Secondary | ICD-10-CM | POA: Diagnosis not present

## 2023-04-16 DIAGNOSIS — Z9151 Personal history of suicidal behavior: Secondary | ICD-10-CM | POA: Diagnosis not present

## 2023-04-16 DIAGNOSIS — G4700 Insomnia, unspecified: Secondary | ICD-10-CM | POA: Diagnosis not present

## 2023-04-16 DIAGNOSIS — E109 Type 1 diabetes mellitus without complications: Secondary | ICD-10-CM | POA: Diagnosis not present

## 2023-04-16 DIAGNOSIS — R9431 Abnormal electrocardiogram [ECG] [EKG]: Secondary | ICD-10-CM | POA: Diagnosis not present

## 2023-04-16 DIAGNOSIS — E1069 Type 1 diabetes mellitus with other specified complication: Secondary | ICD-10-CM | POA: Diagnosis not present

## 2023-04-16 DIAGNOSIS — F339 Major depressive disorder, recurrent, unspecified: Secondary | ICD-10-CM | POA: Diagnosis not present

## 2023-04-16 DIAGNOSIS — E1065 Type 1 diabetes mellitus with hyperglycemia: Secondary | ICD-10-CM | POA: Diagnosis not present

## 2023-04-16 DIAGNOSIS — E1043 Type 1 diabetes mellitus with diabetic autonomic (poly)neuropathy: Secondary | ICD-10-CM | POA: Diagnosis not present

## 2023-04-16 DIAGNOSIS — Z833 Family history of diabetes mellitus: Secondary | ICD-10-CM | POA: Diagnosis not present

## 2023-04-16 DIAGNOSIS — F314 Bipolar disorder, current episode depressed, severe, without psychotic features: Secondary | ICD-10-CM | POA: Diagnosis not present

## 2023-04-24 DIAGNOSIS — F3181 Bipolar II disorder: Secondary | ICD-10-CM | POA: Diagnosis not present

## 2023-04-25 ENCOUNTER — Telehealth: Payer: Self-pay

## 2023-04-25 NOTE — Transitions of Care (Post Inpatient/ED Visit) (Signed)
04/25/2023  Name: Sheryl Matthews MRN: [id number] DOB: 01/20/1966  Today's TOC FU Call Status: Today's TOC FU Call Status:: Successful TOC FU Call Competed TOC FU Call Complete Date: 04/25/23  Transition Care Management Follow-up Telephone Call Date of Discharge: 04/24/23 Discharge Facility: Other ([facility]) Name of Other ([facility]) Discharge Facility: [facility] Type of Discharge: Inpatient Admission Primary Inpatient Discharge Diagnosis:: SI How have you been since you were released from the hospital?: Better Any questions or concerns?: No  Items Reviewed: Did you receive and understand the discharge instructions provided?: Yes Medications obtained,verified, and reconciled?: Yes (Medications Reviewed) Any new allergies since your discharge?: No Dietary orders reviewed?: Yes Do you have support at home?: Yes People in Home: parent(s)  Medications Reviewed Today: Medications Reviewed Today     Reviewed by Karena Addison, LPN (Licensed Practical Nurse) on 04/25/23 at [phone]  Med List Status: <None>   Medication Order Taking? Sig Documenting Provider Last Dose Status Informant  ALPRAZolam (XANAX) 0.5 MG tablet [id number] Yes As needed. [provider] Taking Active   ARIPiprazole (ABILIFY) 5 MG tablet [id number] Yes Take 0.5 tablets by mouth daily with breakfast. [provider] Taking Active   Biotin w/ Vitamins C & E (HAIR/SKIN/NAILS PO) [id number] Yes Take by mouth. [provider] Taking Active   Continuous Blood Gluc Sensor (DEXCOM G6 SENSOR) MISC [id number] Yes 1 each by Does not apply route See admin instructions. Apply 1 sensor to body once every 10 days. Reather Littler, MD Taking Active   doxepin [facility]) 10 MG capsule [id number] Yes Take 10 mg by mouth at bedtime. [provider] Taking Active   esomeprazole (NEXIUM) 40 MG capsule [id number] Yes Nexium 40 mg capsule,delayed release [provider] Taking Active   famotidine (PEPCID)  20 MG tablet [id number] Yes TAKE 1 TABLET TWICE A DAY Breeback, Jade L, PA-C Taking Active   fexofenadine (ALLEGRA) 180 MG tablet [id number] Yes Take by mouth. [provider] Taking Active   FLUoxetine (PROZAC) 40 MG capsule [id number] Yes Take by mouth. [provider] Taking Active   fluticasone (FLONASE) 50 MCG/ACT nasal spray [id number] Yes USE 1 SPRAY IN EACH NOSTRIL DAILY Breeback, Jade L, PA-C Taking Active   gabapentin (NEURONTIN) 300 MG capsule [id number] Yes Take 1 capsule (300 mg total) by mouth daily. Christen Butter, NP Taking Active   Glucagon, rDNA, (GLUCAGON EMERGENCY) 1 MG KIT [id number] Yes INJECT 1 MG INTO THE MUSCLE FOR SEVERE HYPOGLYCEMIA Reather Littler, MD Taking Active   glucose blood (FREESTYLE TEST STRIPS) test strip [id number] Yes Check fasting blood sugar every morning and 2 hours after last two meals of the day. Check up to 3 times daily. Jomarie Longs, PA-C Taking Active   glucose monitoring kit (FREESTYLE) monitoring kit [id number] Yes 1 each by Does not apply route as needed for other. Jomarie Longs, PA-C Taking Active   insulin aspart (NOVOLOG FLEXPEN) 100 UNIT/ML FlexPen [id number] Yes INJECT 7 TO 8 UNITS UNDER THE SKIN THREE TIMES A DAY BEFORE MEALS Reather Littler, MD Taking Active   Insulin Lispro-aabc (LYUMJEV KWIKPEN) 100 UNIT/ML KwikPen [id number] Yes Inject 4 to 10 units before each meal based on carbohydrates and glucose level Reather Littler, MD Taking Active   Insulin Pen Needle (SURE COMFORT PEN NEEDLES) 31G X 5 MM MISC [id number] Yes USE 7 PEN NEEDLES PER DAY TO INJECT INSULIN Reather Littler, MD Taking Active   KETOSTIX strip [id number] Yes USE TO CHECK URINE KETONES IF BLOOD SUGAR IS OVER 300 AS DIRECTED BY  PHYSICIAN Reather Littler, MD Taking Active   LANTUS SOLOSTAR 100 UNIT/ML Solostar Pen [id number] Yes INJECT 20 UNITS UNDER THE SKIN TWICE A DAY  Patient taking differently: She takes 8 units in the morning and 12 at night.   Reather Littler, MD Taking Active    LINZESS 290 MCG CAPS capsule [id number] Yes Take 290 mcg by mouth daily. [provider] Taking Active   metoCLOPramide (REGLAN) 5 MG tablet [id number] Yes Take 5 mg by mouth 4 (four) times daily. [provider] Taking Active   mirabegron ER (MYRBETRIQ) 50 MG TB24 tablet [id number] Yes TAKE 1 TABLET DAILY Breeback, Jade L, PA-C Taking Active   ofloxacin (FLOXIN) 0.3 % OTIC solution [id number] Yes Place 10 drops into the left ear daily. For 7 days. Jomarie Longs, PA-C Taking Active   ondansetron (ZOFRAN) 4 MG tablet [id number] Yes Take 4 mg by mouth every 8 (eight) hours as needed for nausea or vomiting. [provider] Taking Active   pantoprazole (PROTONIX) 40 MG tablet [id number] Yes Take 40 mg by mouth daily. [provider] Taking Active   Probiotic Product (PROBIOTIC ADVANCED PO) [id number] Yes Take by mouth. [provider] Taking Active   venlafaxine XR (EFFEXOR-XR) 37.5 MG 24 hr capsule [id number]  Take by mouth.  Patient not taking: Reported on 02/06/2023   [provider]  Expired 03/08/23 2359             Home Care and Equipment/Supplies: Were Home Health Services Ordered?: NA Any new equipment or medical supplies ordered?: NA  Functional Questionnaire: Do you need assistance with bathing/showering or dressing?: No Do you need assistance with meal preparation?: No Do you need assistance with eating?: No Do you have difficulty maintaining continence: No Do you need assistance with getting out of bed/getting out of a chair/moving?: No Do you have difficulty managing or taking your medications?: No  Follow up appointments reviewed: PCP Follow-up appointment confirmed?: NA Specialist Hospital Follow-up appointment confirmed?: NA Do you need transportation to your follow-up appointment?: No Do you understand care options if your condition(s) worsen?: Yes-patient verbalized understanding    SIGNATURE Karena Addison,  LPN [facility] Nurse Health Advisor Direct Dial [phone]

## 2023-04-26 DIAGNOSIS — F3181 Bipolar II disorder: Secondary | ICD-10-CM | POA: Diagnosis not present

## 2023-05-01 DIAGNOSIS — Z9151 Personal history of suicidal behavior: Secondary | ICD-10-CM | POA: Diagnosis not present

## 2023-05-01 DIAGNOSIS — E109 Type 1 diabetes mellitus without complications: Secondary | ICD-10-CM | POA: Diagnosis not present

## 2023-05-01 DIAGNOSIS — F3181 Bipolar II disorder: Secondary | ICD-10-CM | POA: Diagnosis not present

## 2023-05-03 DIAGNOSIS — F3181 Bipolar II disorder: Secondary | ICD-10-CM | POA: Diagnosis not present

## 2023-05-03 DIAGNOSIS — E1069 Type 1 diabetes mellitus with other specified complication: Secondary | ICD-10-CM | POA: Diagnosis not present

## 2023-05-16 DIAGNOSIS — F4310 Post-traumatic stress disorder, unspecified: Secondary | ICD-10-CM | POA: Diagnosis not present

## 2023-05-16 DIAGNOSIS — F319 Bipolar disorder, unspecified: Secondary | ICD-10-CM | POA: Diagnosis not present

## 2023-05-16 DIAGNOSIS — F411 Generalized anxiety disorder: Secondary | ICD-10-CM | POA: Diagnosis not present
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
## 2023-05-23 DIAGNOSIS — E1042 Type 1 diabetes mellitus with diabetic polyneuropathy: Secondary | ICD-10-CM | POA: Diagnosis not present

## 2023-05-23 DIAGNOSIS — E1065 Type 1 diabetes mellitus with hyperglycemia: Secondary | ICD-10-CM | POA: Diagnosis not present

## 2023-05-29 DIAGNOSIS — F319 Bipolar disorder, unspecified: Secondary | ICD-10-CM | POA: Diagnosis not present

## 2023-05-29 DIAGNOSIS — F411 Generalized anxiety disorder: Secondary | ICD-10-CM | POA: Diagnosis not present

## 2023-05-29 DIAGNOSIS — F4310 Post-traumatic stress disorder, unspecified: Secondary | ICD-10-CM | POA: Diagnosis not present

## 2023-06-01 ENCOUNTER — Other Ambulatory Visit: Payer: Self-pay | Admitting: Physician Assistant

## 2023-06-06 DIAGNOSIS — F3181 Bipolar II disorder: Secondary | ICD-10-CM | POA: Diagnosis not present

## 2023-06-06 DIAGNOSIS — E1069 Type 1 diabetes mellitus with other specified complication: Secondary | ICD-10-CM | POA: Diagnosis not present

## 2023-06-18 DIAGNOSIS — F319 Bipolar disorder, unspecified: Secondary | ICD-10-CM | POA: Diagnosis not present

## 2023-06-19 DIAGNOSIS — E1065 Type 1 diabetes mellitus with hyperglycemia: Secondary | ICD-10-CM | POA: Diagnosis not present

## 2023-06-26 DIAGNOSIS — F319 Bipolar disorder, unspecified: Secondary | ICD-10-CM | POA: Diagnosis not present

## 2023-07-06 ENCOUNTER — Ambulatory Visit: Payer: Medicare Other | Admitting: Physician Assistant

## 2023-08-07 DIAGNOSIS — H04123 Dry eye syndrome of bilateral lacrimal glands: Secondary | ICD-10-CM | POA: Diagnosis not present

## 2023-08-07 DIAGNOSIS — H526 Other disorders of refraction: Secondary | ICD-10-CM | POA: Diagnosis not present

## 2023-08-07 DIAGNOSIS — Z961 Presence of intraocular lens: Secondary | ICD-10-CM | POA: Diagnosis not present

## 2023-08-07 DIAGNOSIS — E103293 Type 1 diabetes mellitus with mild nonproliferative diabetic retinopathy without macular edema, bilateral: Secondary | ICD-10-CM | POA: Diagnosis not present

## 2023-08-07 DIAGNOSIS — H43813 Vitreous degeneration, bilateral: Secondary | ICD-10-CM | POA: Diagnosis not present

## 2023-08-28 ENCOUNTER — Ambulatory Visit: Payer: Medicare Other | Admitting: Physician Assistant

## 2023-09-04 DIAGNOSIS — R140 Abdominal distension (gaseous): Secondary | ICD-10-CM | POA: Diagnosis not present

## 2023-09-04 DIAGNOSIS — K581 Irritable bowel syndrome with constipation: Secondary | ICD-10-CM | POA: Diagnosis not present

## 2023-09-04 DIAGNOSIS — K3184 Gastroparesis: Secondary | ICD-10-CM | POA: Diagnosis not present

## 2023-09-04 DIAGNOSIS — R1033 Periumbilical pain: Secondary | ICD-10-CM | POA: Diagnosis not present

## 2023-09-10 DIAGNOSIS — F319 Bipolar disorder, unspecified: Secondary | ICD-10-CM | POA: Diagnosis not present

## 2023-09-11 ENCOUNTER — Ambulatory Visit: Payer: Medicare Other | Admitting: Physician Assistant

## 2023-09-11 DIAGNOSIS — R140 Abdominal distension (gaseous): Secondary | ICD-10-CM | POA: Diagnosis not present

## 2023-09-11 DIAGNOSIS — Z9049 Acquired absence of other specified parts of digestive tract: Secondary | ICD-10-CM | POA: Diagnosis not present

## 2023-09-11 DIAGNOSIS — R1033 Periumbilical pain: Secondary | ICD-10-CM | POA: Diagnosis not present

## 2023-09-11 DIAGNOSIS — I700 Atherosclerosis of aorta: Secondary | ICD-10-CM | POA: Diagnosis not present

## 2023-09-11 DIAGNOSIS — R109 Unspecified abdominal pain: Secondary | ICD-10-CM | POA: Diagnosis not present

## 2023-09-12 ENCOUNTER — Other Ambulatory Visit (HOSPITAL_COMMUNITY)
Admission: RE | Admit: 2023-09-12 | Discharge: 2023-09-12 | Disposition: A | Discharge status: Home / Self Care | Payer: Medicare Other | Source: Ambulatory Visit | Attending: Physician Assistant | Admitting: Physician Assistant

## 2023-09-12 ENCOUNTER — Ambulatory Visit (INDEPENDENT_AMBULATORY_CARE_PROVIDER_SITE_OTHER): Payer: Medicare Other | Admitting: Physician Assistant

## 2023-09-12 ENCOUNTER — Telehealth: Payer: Self-pay | Admitting: Physician Assistant

## 2023-09-12 VITALS — BP 125/75 | HR 71 | Ht 62.0 in | Wt 157.0 lb

## 2023-09-12 DIAGNOSIS — Z01419 Encounter for gynecological examination (general) (routine) without abnormal findings: Secondary | ICD-10-CM | POA: Insufficient documentation

## 2023-09-12 DIAGNOSIS — L209 Atopic dermatitis, unspecified: Secondary | ICD-10-CM | POA: Diagnosis not present

## 2023-09-12 DIAGNOSIS — E559 Vitamin D deficiency, unspecified: Secondary | ICD-10-CM | POA: Diagnosis not present

## 2023-09-12 DIAGNOSIS — Z780 Asymptomatic menopausal state: Secondary | ICD-10-CM | POA: Diagnosis not present

## 2023-09-12 DIAGNOSIS — Z0000 Encounter for general adult medical examination without abnormal findings: Secondary | ICD-10-CM | POA: Diagnosis not present

## 2023-09-12 DIAGNOSIS — E663 Overweight: Secondary | ICD-10-CM | POA: Diagnosis not present

## 2023-09-12 DIAGNOSIS — N882 Stricture and stenosis of cervix uteri: Secondary | ICD-10-CM | POA: Diagnosis not present

## 2023-09-12 DIAGNOSIS — N952 Postmenopausal atrophic vaginitis: Secondary | ICD-10-CM | POA: Diagnosis not present

## 2023-09-12 DIAGNOSIS — Z23 Encounter for immunization: Secondary | ICD-10-CM

## 2023-09-12 DIAGNOSIS — E785 Hyperlipidemia, unspecified: Secondary | ICD-10-CM

## 2023-09-12 DIAGNOSIS — E1065 Type 1 diabetes mellitus with hyperglycemia: Secondary | ICD-10-CM | POA: Diagnosis not present

## 2023-09-12 LAB — POCT UA - MICROALBUMIN
Albumin/Creatinine Ratio, Urine, POC: <30
Creatinine, POC: 200 mg/dL
Microalbumin Ur, POC: 30 mg/L

## 2023-09-12 LAB — POCT GLYCOSYLATED HEMOGLOBIN (HGB A1C): Hemoglobin A1C: 8.1 % — AB (ref 4.0–5.6)

## 2023-09-12 MED ORDER — ESTRADIOL 0.1 MG/GM VA CREA: TOPICAL_CREAM | VAGINAL | 12 refills | Status: DC

## 2023-09-12 MED ORDER — TRIAMCINOLONE ACETONIDE 0.1 % EX CREA
1.0000 | TOPICAL_CREAM | Freq: Two times a day (BID) | CUTANEOUS | 1 refills | Status: DC
Start: 2023-09-12 — End: 2024-10-03

## 2023-09-12 NOTE — Patient Instructions (Signed)

## 2023-09-12 NOTE — Telephone Encounter (Signed)
Does she want to get the first tube at a local pharmacy and then we will better know how to prescribe the 90 day supply?

## 2023-09-12 NOTE — Progress Notes (Signed)
Complete physical exam  Patient: Sheryl Matthews   DOB: 1966/01/01   57 y.o. Female  MRN: [id number]  Subjective:    Chief Complaint  Patient presents with   Medical Management of Chronic Issues    Annual pap    Sheryl Matthews is a 57 y.o. female who presents today for a complete physical exam. She reports consuming a general diet. The patient does not participate in regular exercise at present. She generally feels fairly well. She reports sleeping fairly well. She does have additional problems to discuss today.   Pt has itchy scaly bilateral elbows for last few months. She has tried lotion and not seemingly helping.    Most recent fall risk assessment:    09/12/2023   11:16 AM  Fall Risk   Falls in the past year? 0  Number falls in past yr: 0  Injury with Fall? 0  Risk for fall due to : No Fall Risks     Most recent depression screenings:    09/12/2023   11:07 AM 01/03/2023    2:04 PM  PHQ 2/9 Scores  PHQ - 2 Score 0 0    Vision:Within last year and Dental: No current dental problems and Receives regular dental care  Patient Active Problem List   Diagnosis Date Noted   Cervical os stenosis 09/18/2023   Atopic dermatitis 09/18/2023   Post-menopausal 09/18/2023   Dyslipidemia, goal LDL below 70 09/18/2023   Overweight (BMI 25.0-29.9) 09/12/2023   Atrophic vaginitis 09/12/2023   Gastroparesis 01/03/2023   Tremor of both hands 05/25/2021   Benign paroxysmal positional vertigo, left 05/25/2021   Mid back pain on right side 01/05/2021   Fall 01/05/2021   Neck pain 01/05/2021   Esophageal dysphagia 11/10/2019   Nausea and vomiting 11/10/2019   OAB (overactive bladder) 08/12/2019   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   Tinea manuum 05/05/2019   Toenail fungus 05/05/2019   Posterior vitreous degeneration, bilateral 04/14/2019   Chronic constipation 12/24/2018   RLS (restless legs syndrome) 12/18/2017   SOB (shortness of breath) 02/02/2017   No energy 02/02/2017    Chest tightness 02/02/2017   Weakness 02/02/2017   Vitamin D insufficiency 01/30/2017   Low iron stores 01/30/2017   Fibromyalgia 12/26/2016   Left breast mass 07/12/2016   Painful breasts 06/14/2016   History of fibrocystic disease of breast 06/14/2016   HAV (hallux abducto valgus) 12/20/2015   Adductovarus rotation of toe, acquired 12/20/2015   Uncontrolled type 1 diabetes mellitus with hyperglycemia (HCC) 09/15/2015   Internal hemorrhoids 08/16/2015   Abdominal pain 06/07/2015   Mild nonproliferative diabetic retinopathy (HCC) 12/22/2014   Pseudophakia of both eyes 12/22/2014   Bipolar 1 disorder with moderate mania (HCC) 11/02/2014   Past Medical History:  Diagnosis Date   Bipolar 1 disorder (HCC)    Deafness in left ear    Depression    Diabetes mellitus without complication (HCC)    Gastroparesis    Osteoarthritis    Pain of left breast    Skin disorder    Past Surgical History:  Procedure Laterality Date   ANKLE FRACTURE SURGERY Left    BREAST ENHANCEMENT SURGERY     BREAST EXCISIONAL BIOPSY Left    CERVICAL DISC SURGERY     CESAREAN SECTION     cosmetic surgery facial     ELBOW SURGERY Right    GALLBLADDER SURGERY     INNER EAR SURGERY     MINOR CARPAL TUNNEL Left  Family Status  Relation Name Status   Sister  (Not Specified)   Brother  (Not Specified)   Mat Aunt  (Not Specified)  No partnership data on file   Family History  Problem Relation Age of Onset   Depression Sister    Alcohol abuse Brother    Diabetes Brother    Depression Brother    Breast cancer Maternal Aunt    Allergies  Allergen Reactions   Latex Rash and Itching    Other reaction(s): rash/itching    Amitriptyline     Other reaction(s): Other Groggy/constipation/did not think worked  Groggy/constipation/did not think worked    Pregabalin     Did not have benefit.  Falling asleep. Feeling depressed. Other reaction(s): Unknown Did not have benefit.  Falling  asleep. Feeling depressed. Did not have benefit.  Falling asleep. Feeling depressed.   Tape Rash   Elavil [Amitriptyline Hcl]     Groggy/constipation/did not think worked    Lamotrigine Itching    Per [facility]   Other     Other reaction(s): Rash   Wound Dressing Adhesive       Patient Care Team: Nolene Ebbs as PCP - General (Family Medicine)   Outpatient Medications Prior to Visit  Medication Sig   ALPRAZolam (XANAX) 0.5 MG tablet As needed.   ARIPiprazole (ABILIFY) 5 MG tablet Take 0.5 tablets by mouth daily with breakfast.   Biotin w/ Vitamins C & E (HAIR/SKIN/NAILS PO) Take by mouth.   Continuous Blood Gluc Sensor (DEXCOM G6 SENSOR) MISC 1 each by Does not apply route See admin instructions. Apply 1 sensor to body once every 10 days.   doxepin (SINEQUAN) 10 MG capsule Take 10 mg by mouth at bedtime.   esomeprazole (NEXIUM) 40 MG capsule Nexium 40 mg capsule,delayed release   famotidine (PEPCID) 20 MG tablet TAKE 1 TABLET TWICE A DAY   fexofenadine (ALLEGRA) 180 MG tablet Take by mouth.   FLUoxetine (PROZAC) 40 MG capsule Take by mouth.   fluticasone (FLONASE) 50 MCG/ACT nasal spray USE 1 SPRAY IN EACH NOSTRIL DAILY   gabapentin (NEURONTIN) 300 MG capsule Take 1 capsule (300 mg total) by mouth daily.   Glucagon, rDNA, (GLUCAGON EMERGENCY) 1 MG KIT INJECT 1 MG INTO THE MUSCLE FOR SEVERE HYPOGLYCEMIA   glucose blood (FREESTYLE TEST STRIPS) test strip Check fasting blood sugar every morning and 2 hours after last two meals of the day. Check up to 3 times daily.   glucose monitoring kit (FREESTYLE) monitoring kit 1 each by Does not apply route as needed for other.   insulin aspart (NOVOLOG FLEXPEN) 100 UNIT/ML FlexPen INJECT 7 TO 8 UNITS UNDER THE SKIN THREE TIMES A DAY BEFORE MEALS   Insulin Lispro-aabc (LYUMJEV KWIKPEN) 100 UNIT/ML KwikPen Inject 4 to 10 units before each meal based on carbohydrates and glucose level   Insulin Pen Needle (SURE COMFORT PEN NEEDLES) 31G X  5 MM MISC USE 7 PEN NEEDLES PER DAY TO INJECT INSULIN   KETOSTIX strip USE TO CHECK URINE KETONES IF BLOOD SUGAR IS OVER 300 AS DIRECTED BY PHYSICIAN   LANTUS SOLOSTAR 100 UNIT/ML Solostar Pen INJECT 20 UNITS UNDER THE SKIN TWICE A DAY (Patient taking differently: She takes 8 units in the morning and 12 at night.)   LINZESS 290 MCG CAPS capsule Take 290 mcg by mouth daily.   metoCLOPramide (REGLAN) 5 MG tablet Take 5 mg by mouth 4 (four) times daily.   mirabegron ER (MYRBETRIQ) 50 MG TB24 tablet TAKE  1 TABLET DAILY   ofloxacin (FLOXIN) 0.3 % OTIC solution Place 10 drops into the left ear daily. For 7 days.   ondansetron (ZOFRAN) 4 MG tablet Take 4 mg by mouth every 8 (eight) hours as needed for nausea or vomiting.   pantoprazole (PROTONIX) 40 MG tablet Take 40 mg by mouth daily.   Probiotic Product (PROBIOTIC ADVANCED PO) Take by mouth.   [DISCONTINUED] venlafaxine XR (EFFEXOR-XR) 37.5 MG 24 hr capsule Take by mouth. (Patient not taking: Reported on 02/06/2023)   No facility-administered medications prior to visit.    ROS  See HPI.       Objective:     BP 125/75   Pulse 71   Ht 5\' 2"  (1.575 m)   Wt 157 lb (71.2 kg)   LMP 11/25/2017   SpO2 99%   BMI 28.72 kg/m  BP Readings from Last 3 Encounters:  09/12/23 125/75  01/03/23 132/69  07/11/22 123/74   Wt Readings from Last 3 Encounters:  09/12/23 157 lb (71.2 kg)  01/03/23 155 lb 1.9 oz (70.4 kg)  08/16/22 151 lb (68.5 kg)      Physical Exam  BP 125/75   Pulse 71   Ht 5\' 2"  (1.575 m)   Wt 157 lb (71.2 kg)   LMP 11/25/2017   SpO2 99%   BMI 28.72 kg/m   General Appearance:    Alert, cooperative, no distress, appears stated age  Head:    Normocephalic, without obvious abnormality, atraumatic  Eyes:    PERRL, conjunctiva/corneas clear, EOM's intact, fundi    benign, both eyes  Ears:    Normal TM's and external ear canals, both ears  Nose:   Nares normal, septum midline, mucosa normal, no drainage    or sinus  tenderness  Throat:   Lips, mucosa, and tongue normal; teeth and gums normal  Neck:   Supple, symmetrical, trachea midline, no adenopathy;    thyroid:  no enlargement/tenderness/nodules; no carotid   bruit or JVD  Back:     Symmetric, no curvature, ROM normal, no CVA tenderness  Lungs:     Clear to auscultation bilaterally, respirations unlabored  Chest Wall:    No tenderness or deformity   Heart:    Regular rate and rhythm, S1 and S2 normal, no murmur, rub   or gallop  Breast Exam:    No tenderness, masses, or nipple abnormality  Abdomen:     Soft, non-tender, bowel sounds active all four quadrants,    no masses, no organomegaly  Genitalia:    External vaginal atrophy, used small speculum due to atrophic vaginitis with stenosis of os. Painful exam.   Rectal:    Normal tone, normal prostate, no masses or tenderness;   guaiac negative stool  Extremities:   Extremities normal, atraumatic, no cyanosis or edema  Pulses:   2+ and symmetric all extremities  Skin:   Skin color, texture, turgor normal, scaly bilateral elbows  Lymph nodes:   Cervical, supraclavicular, and axillary nodes normal  Neurologic:   CNII-XII intact, normal strength, sensation and reflexes    throughout        Assessment & Plan:    Routine Health Maintenance and Physical Exam  Immunization History  Administered Date(s) Administered   HPV Quadrivalent 10/07/2020   Influenza, Seasonal, Injecte, Preservative Fre 09/20/2015, 08/15/2016, 09/12/2023   Influenza,inj,Quad PF,6+ Mos 08/31/2017, 07/23/2018, 08/11/2019   Influenza-Unspecified 10/04/2007, 09/20/2015, 08/15/2016, 08/02/2018, 09/11/2022   Pneumococcal Polysaccharide-23 05/02/2019   Tdap 10/31/2018   Zoster Recombinant(Shingrix) 08/12/2019  Health Maintenance  Topic Date Due   HIV Screening  Never done   Hepatitis C Screening  Never done   Zoster Vaccines- Shingrix (2 of 2) 10/07/2019   Medicare Annual Wellness (AWV)  04/01/2023   OPHTHALMOLOGY EXAM   08/05/2023   COVID-19 Vaccine (1) 09/28/2023 (Originally 11/25/1971)   MAMMOGRAM  09/29/2023   HEMOGLOBIN A1C  03/12/2024   Diabetic kidney evaluation - Urine ACR  09/11/2024   FOOT EXAM  09/11/2024   Diabetic kidney evaluation - eGFR measurement  09/12/2024   Cervical Cancer Screening (HPV/Pap Cotest)  09/11/2026   Colonoscopy  10/18/2026   DTaP/Tdap/Td (2 - Td or Tdap) 10/31/2028   INFLUENZA VACCINE  Completed   HPV VACCINES  Aged Out    Discussed health benefits of physical activity, and encouraged her to engage in regular exercise appropriate for her age and condition.  Marland KitchenSteward Drone was seen today for medical management of chronic issues.  Diagnoses and all orders for this visit:  Pap smear, as part of routine gynecological examination -     Cytology - PAP  Immunization due -     Flu vaccine trivalent PF, 6mos and older(Flulaval,Afluria,Fluarix,Fluzone)  Uncontrolled type 1 diabetes mellitus with hyperglycemia (HCC) -     POCT HgB A1C -     POCT UA - Microalbumin -     CMP14+EGFR -     CBC w/Diff/Platelet -     rosuvastatin (CRESTOR) 5 MG tablet; Take 1 tablet (5 mg total) by mouth daily.  Atrophic vaginitis  Overweight (BMI 25.0-29.9) -     TSH + free T4  Preventative health care -     Lipid panel -     CMP14+EGFR -     TSH + free T4 -     CBC w/Diff/Platelet -     VITAMIN D 25 Hydroxy (Vit-D Deficiency, Fractures) -     B12 and Folate Panel  Post-menopausal -     VITAMIN D 25 Hydroxy (Vit-D Deficiency, Fractures) -     B12 and Folate Panel  Vitamin D insufficiency -     VITAMIN D 25 Hydroxy (Vit-D Deficiency, Fractures)  Atopic dermatitis, unspecified type -     triamcinolone cream (KENALOG) 0.1 %; Apply 1 Application topically 2 (two) times daily. To areas of dry skin.  Cervical os stenosis  Dyslipidemia, goal LDL below 70 -     rosuvastatin (CRESTOR) 5 MG tablet; Take 1 tablet (5 mg total) by mouth daily.  Other orders -     Discontinue: estradiol  (ESTRACE VAGINAL) 0.1 MG/GM vaginal cream; Apply 1g of cream vaginally nightly for 3 weeks then decrease to 3 times a week.   .. Discussed 150 minutes of exercise a week.  Encouraged vitamin D 1000 units and Calcium 1300mg  or 4 servings of dairy a day.  Fasting labs ordered Pap done today Atrophic vaginitis start estrace daily for 2 weeks then TID Dermatitis use topical steroid on elbows Crestor to meet LDL under 70 goal   Return in about 6 months (around 03/12/2024).     Tandy Gaw, PA-C

## 2023-09-12 NOTE — Telephone Encounter (Signed)
Patient called she is changing pharmacies please submit  Estradiol 01.mg Cream To [facility] [place]  Phone number [phone]

## 2023-09-13 ENCOUNTER — Encounter: Payer: Self-pay | Admitting: Physician Assistant

## 2023-09-13 DIAGNOSIS — Z780 Asymptomatic menopausal state: Secondary | ICD-10-CM | POA: Diagnosis not present

## 2023-09-13 DIAGNOSIS — E663 Overweight: Secondary | ICD-10-CM | POA: Diagnosis not present

## 2023-09-13 DIAGNOSIS — E1065 Type 1 diabetes mellitus with hyperglycemia: Secondary | ICD-10-CM | POA: Diagnosis not present

## 2023-09-13 DIAGNOSIS — E559 Vitamin D deficiency, unspecified: Secondary | ICD-10-CM | POA: Diagnosis not present

## 2023-09-13 DIAGNOSIS — Z978 Presence of other specified devices: Secondary | ICD-10-CM | POA: Diagnosis not present

## 2023-09-13 DIAGNOSIS — Z0000 Encounter for general adult medical examination without abnormal findings: Secondary | ICD-10-CM | POA: Diagnosis not present

## 2023-09-14 ENCOUNTER — Other Ambulatory Visit: Payer: Self-pay | Admitting: Physician Assistant

## 2023-09-14 ENCOUNTER — Telehealth: Payer: Self-pay | Admitting: Physician Assistant

## 2023-09-14 LAB — LIPID PANEL
Chol/HDL Ratio: 2.4 {ratio} (ref 0.0–4.4)
Cholesterol, Total: 261 mg/dL — ABNORMAL HIGH (ref 100–199)
HDL: 108 mg/dL (ref >39)
LDL Chol Calc (NIH): 139 mg/dL — ABNORMAL HIGH (ref 0–99)
Triglycerides: 84 mg/dL (ref 0–149)
VLDL Cholesterol Cal: 14 mg/dL (ref 5–40)

## 2023-09-14 LAB — CBC WITH DIFFERENTIAL/PLATELET
Basophils Absolute: 0.1 10*3/uL (ref 0.0–0.2)
Basos: 1 %
EOS (ABSOLUTE): 0.2 10*3/uL (ref 0.0–0.4)
Eos: 3 %
Hematocrit: 39.4 % (ref 34.0–46.6)
Hemoglobin: 12.7 g/dL (ref 11.1–15.9)
Immature Grans (Abs): 0 10*3/uL (ref 0.0–0.1)
Immature Granulocytes: 0 %
Lymphocytes Absolute: 1.5 10*3/uL (ref 0.7–3.1)
Lymphs: 32 %
MCH: 28.4 pg (ref 26.6–33.0)
MCHC: 32.2 g/dL (ref 31.5–35.7)
MCV: 88 fL (ref 79–97)
Monocytes Absolute: 0.4 10*3/uL (ref 0.1–0.9)
Monocytes: 8 %
Neutrophils Absolute: 2.5 10*3/uL (ref 1.4–7.0)
Neutrophils: 56 %
Platelets: 290 10*3/uL (ref 150–450)
RBC: 4.47 x10E6/uL (ref 3.77–5.28)
RDW: 12.8 % (ref 11.7–15.4)
WBC: 4.6 10*3/uL (ref 3.4–10.8)

## 2023-09-14 LAB — CYTOLOGY - PAP: Diagnosis: NEGATIVE

## 2023-09-14 LAB — CMP14+EGFR
ALT: 26 [IU]/L (ref 0–32)
AST: 18 [IU]/L (ref 0–40)
Albumin: 4.3 g/dL (ref 3.8–4.9)
Alkaline Phosphatase: 160 [IU]/L — ABNORMAL HIGH (ref 44–121)
BUN/Creatinine Ratio: 24 — ABNORMAL HIGH (ref 9–23)
BUN: 19 mg/dL (ref 6–24)
Bilirubin Total: 0.3 mg/dL (ref 0.0–1.2)
CO2: 26 mmol/L (ref 20–29)
Calcium: 9.3 mg/dL (ref 8.7–10.2)
Chloride: 102 mmol/L (ref 96–106)
Creatinine, Ser: 0.8 mg/dL (ref 0.57–1.00)
Globulin, Total: 2.6 g/dL (ref 1.5–4.5)
Glucose: 114 mg/dL — ABNORMAL HIGH (ref 70–99)
Potassium: 4.6 mmol/L (ref 3.5–5.2)
Sodium: 141 mmol/L (ref 134–144)
Total Protein: 6.9 g/dL (ref 6.0–8.5)
eGFR: 85 mL/min/{1.73_m2} (ref >59)

## 2023-09-14 LAB — TSH+FREE T4
Free T4: 1.22 ng/dL (ref 0.82–1.77)
TSH: 2.07 u[IU]/mL (ref 0.450–4.500)

## 2023-09-14 LAB — B12 AND FOLATE PANEL
Folate: 14.1 ng/mL (ref >3.0)
Vitamin B-12: 583 pg/mL (ref 232–1245)

## 2023-09-14 LAB — VITAMIN D 25 HYDROXY (VIT D DEFICIENCY, FRACTURES): Vit D, 25-Hydroxy: 23.8 ng/mL — ABNORMAL LOW (ref 30.0–100.0)

## 2023-09-14 MED ORDER — ESTRADIOL 0.1 MG/GM VA CREA: TOPICAL_CREAM | VAGINAL | 1 refills | Status: AC

## 2023-09-14 MED ORDER — ROSUVASTATIN CALCIUM 5 MG PO TABS
5.0000 mg | ORAL_TABLET | Freq: Every day | ORAL | 3 refills | Status: DC
Start: 2023-09-14 — End: 2024-10-10

## 2023-09-14 NOTE — Progress Notes (Signed)
No abnormal cells and lots of vaginal atrophy that vaginal estrogen should help with.

## 2023-09-14 NOTE — Telephone Encounter (Signed)
Patient called she would like her estradiol cream 0.1 mg resubmitted to [facility] on [address] [place] [address] it was sent to [facility] previously  Phone number  [phone]

## 2023-09-14 NOTE — Progress Notes (Signed)
Brittni,   LDL not to goal of under 70. We need to start a statin to lower your cardiovascular risk. Ok with starting?   Thyroid looks good.   B12 looks good.   Vitamin D not to goal add 1000 units to whatever you are taking and take with dairy for better absorption.

## 2023-09-14 NOTE — Telephone Encounter (Signed)
Sent!

## 2023-09-18 ENCOUNTER — Encounter: Payer: Self-pay | Admitting: Physician Assistant

## 2023-09-18 DIAGNOSIS — N882 Stricture and stenosis of cervix uteri: Secondary | ICD-10-CM | POA: Insufficient documentation

## 2023-09-18 DIAGNOSIS — L209 Atopic dermatitis, unspecified: Secondary | ICD-10-CM | POA: Insufficient documentation

## 2023-09-18 DIAGNOSIS — E785 Hyperlipidemia, unspecified: Secondary | ICD-10-CM | POA: Insufficient documentation

## 2023-09-18 DIAGNOSIS — Z780 Asymptomatic menopausal state: Secondary | ICD-10-CM | POA: Insufficient documentation

## 2023-12-10 DIAGNOSIS — F319 Bipolar disorder, unspecified: Secondary | ICD-10-CM | POA: Diagnosis not present

## 2023-12-18 DIAGNOSIS — Z978 Presence of other specified devices: Secondary | ICD-10-CM | POA: Diagnosis not present

## 2023-12-18 DIAGNOSIS — E1065 Type 1 diabetes mellitus with hyperglycemia: Secondary | ICD-10-CM | POA: Diagnosis not present

## 2023-12-18 NOTE — Progress Notes (Signed)
 Subjective   Patient ID: Sheryl Matthews is a 58 y.o. (DOB 10-15-1966) female  History of Present Illness: This is a 58 y.o. female who returns for follow-up of type 1 diabetes. As noted, initial consult was on 05/31/22 with [name], NP. She was diagnosed with diabetes at age 55. She is accompanied by her husband today.    Medical history significant for T1D (on MDI & Dexcom), bipolar depression, generalized anxiety disorder, and PTSD. She follows psychiatry regularly.   Of note, patient was agreeable to give the iLet pump a try. She came in office for training but left mid-training due to being overwhelmed. Since last OV, she has improved with being consistent with insulin  administration and overall A1c has improved. She does report wanting to give a pump a try when she is ready.   A1c 7.0% today. Last A1c 8.1% on 09/13/23, 9.5% on 06/19/23, 8.9% on 01/01/23, 9.1% on 07/27/22, 8.8% on 03/06/22. She has reported that her previous A1cs have been in the 7-8% range.    Patient confirms taking diabetes medications as follows:  Lantus  14 units BID   -reduced to 12 units BID due to hypoglycemia Novolog  8 units TID AC   -10-11 with meal        Weight trend:  Body mass index is 27.44 kg/m.  Wt Readings from Last 3 Encounters:  12/18/23 150 lb (68 kg)  09/13/23 154 lb (69.9 kg)  09/04/23 157 lb (71.2 kg)    Retinopathy: Yes  Neuropathy: Autonomic; gastroparesis  Nephropathy: No historical documentation    Cardiovascular disease: No historical documentation  Cerebrovascular disease: No historical documentation  Peripheral vascular disease: No historical documentation    Most recent retinal exam:                                      ? Most recent LDL cholesterol:                                 141mg /dL (95/89/76)               Taking statin?:                                          No  Most recent urine microalbumin/Cr ratio:              30mg /g (09/29/21)                Taking ACEI/ARB?:                                  No  Taking daily ASA?:                                                No  Current smoker?:  No  Discussed smoking cessation?:                            N/A   Patient Active Problem List  Diagnosis  . Epigastric pain  . Helicobacter pylori (H. pylori) infection  . Irritable bowel syndrome with constipation  . Dysphagia  . Bipolar 1 disorder with moderate mania (*)  . Fibromyalgia  . History of fibrocystic disease of breast  . Mild nonproliferative diabetic retinopathy (*)  . Pseudophakia of both eyes  . Uncontrolled type 1 diabetes mellitus with hyperglycemia (*)  . Bilateral carpal tunnel syndrome  . Spondylosis of cervical region without myelopathy or radiculopathy  . Gastroesophageal reflux disease without esophagitis  . S/P cervical spinal fusion  . Snoring  . Encounter for counseling for sleep apnea  . Obstructive sleep apnea  . Chronic narcotic use  . Biliary colic  . Excessive sleepiness  . Anal fissure  . Nausea and vomiting  . Gastroparesis  . Left proximal humeral shaft fracture-10/07/2021  . ORIF LEFT proximal humeral shaft fracture-10/11/2021  . Uncontrolled type 1 diabetes mellitus with hyperglycemia, with long-term current use of insulin  (*)  . Uses self-applied continuous glucose monitoring device    Past Medical History:  Diagnosis Date  . Anxiety   . Arthritis   . Benign cyst of breast, left   . Bipolar 1 disorder (*)   . COPD (chronic obstructive pulmonary disease) (*)   . Dental crowns present   . Depression   . Diabetes mellitus (*)    FBS AVG-200 type 1  . Fibromyalgia   . Gastric ulcer   . GERD (gastroesophageal reflux disease)   . Hearing loss   . History of gastric ulcer   . Hx of iron deficiency anemia   . Kidney stone    PASSED IT  . Lupus   . Neuropathy    TOES AND HANDS  . OSA (obstructive sleep apnea)    cpap  . OSA on CPAP   .  PONV (postoperative nausea and vomiting)   . Psoriasis   . Rheumatoid arthritis (*)    PER PT  . Seasonal allergies     Past Surgical History:  Procedure Laterality Date  . Anterior cervical discectomy w/ fusion  07/24/2017   C5-6, C6-7. DR. CAROLEE.  . Carpal tunnel release Right 07/24/2017   DR. BELL  . Cataract extraction  2014  . Colonoscopy  2018  . Colonoscopy  08/18/2019  . Cosmetic surgery     bilaterral breast implants removed.   . Laparoscopic cholecystectomy  2019  . Left ear surgery    . Leg surgery Left 2010   BROKEN  . Orif humerus fracture Left 10/11/2021   ORIF LEFT proximal humeral shaft fracture  . Tonsillectomy    . Transumbilical augmentation mammoplasty     . Trigger finger release Left   . Upper gastrointestinal endoscopy  04/05/2017  . Upper gastrointestinal endoscopy  12/2018    Current Outpatient Medications  Medication Instructions  . ALPRAZolam  (XANAX ) 0.5 mg, Oral, Daily as needed  . ARIPiprazole (ABILIFY) 5 mg, Oral, Daily with breakfast  . bisacodyl (DULCOLAX) 20 mg, Oral, Every 7 days  . Continuous Blood Gluc Transmit (DEXCOM G6 TRANSMITTER) MISC Use as directed  . Continuous Glucose Receiver (DEXCOM G7 RECEIVER) DEVI 1 each, Does not apply, Continuous  . Continuous Glucose Sensor (DEXCOM G7 SENSOR) MISC 1 each, Does not apply, Every 10 days  .  doxepin HCl (SINEQUAN) 10 mg, Oral, At bedtime  . famotidine  (PEPCID ) 20 mg, Oral, 2 times a day  . fexofenadine  (ALLEGRA ) 180 mg, Oral, Every morning  . fluoxetine  (PROZAC ) 40 mg, Oral, Daily  . FLUoxetine  HCl (PROZAC ) 60 MG TABS Every morning  . fluticasone  propionate (FLONASE ) 50 mcg/actuation nasal spray 1 spray, Both Nostrils, Daily as needed  . gabapentin  (NEURONTIN ) 300 mg, Oral, At bedtime as needed  . GVOKE HYPOPEN  2-PACK 1 mg, Subcutaneous, As needed  . ibuprofen (ADVIL,MOTRIN) 800 mg, Oral, Every 8 hours as needed  . insulin  aspart (NOVOLOG ) 100 Unit/mL injection Inject up to 10 units into  the skin with meals plus correction factor ratio, as directed. Max daily dose up to 50 units.  . Insulin  Disposable Pump (OMNIPOD 5 DEXG7G6 INTRO GEN 5) KIT 1 each, Does not apply, Continuous  . Insulin  Disposable Pump (OMNIPOD 5 DEXG7G6 PODS GEN 5) MISC 1 system, Transdermal, Every 3 days  . Insulin  Glargine (LANTUS  SOLOSTAR) 100 UNIT/ML SOPN Inject up to 15 units TWICE daily.  . metoclopramide  HCl (REGLAN ) 5 MG tablet TAKE 1 TABLET THREE TIMES A DAY BEFORE MEALS  . Misc. Devices MISC Pharmacy - please dispense Fiasp  Pump Cartridges for iLet [employer]ion[employer] insulin  pump. Please disease 3 boxes (15 carts). Max 80 units daily.  . MYRBETRIQ  50 mg, Oral, Daily  . NOVOLOG  FLEXPEN 8 Units, Subcutaneous, With meals  . ofloxacin  (OCUFLOX ) 0.3% ophthalmic solution No dose, route, or frequency recorded.  . ondansetron  (ZOFRAN ) 8 mg, Oral, 2 times a day  . pantoprazole sodium (PROTONIX) 40 mg tablet TAKE 1 TABLET 30 MINUTES BEFORE BREAKFAST  . sertraline (ZOLOFT) 200 mg, Oral, Daily     Reviewed and updated this visit by provider: Tobacco  Allergies  Meds  Problems  Med Hx  Surg Hx  Fam Hx      ROS Eight systems were reviewed, and pertinent positives and negatives are as discussed in HPI.  Objective   Vitals:   12/18/23 1343  BP: 126/75  Height: 5' 2 (1.575 m)  Weight: 150 lb (68 kg)  BMI (Calculated): 27.4   Physical Exam Vitals reviewed.  Cardiovascular:     Rate and Rhythm: Normal rate and regular rhythm.  Pulmonary:     Effort: Pulmonary effort is normal.     Breath sounds: Normal breath sounds.  Skin:    General: Skin is warm and dry.  Neurological:     General: No focal deficit present.     Mental Status: She is alert and oriented to person, place, and time. Mental status is at baseline.  Psychiatric:        Mood and Affect: Mood normal.        Behavior: Behavior normal.        Thought Content: Thought content normal.        Judgment: Judgment normal.          Laboratory data Lab Results  Component Value Date   Hemoglobin A1c 7.0 (A) 12/18/2023   Hemoglobin A1c 9.5 (A) 06/19/2023   Hemoglobin A1c 8.9 (A) 01/01/2023   Results for orders placed or performed in visit on 07/27/22  POCT ACR URINE  Result Value Ref Range   Alb Creat Ratio Urine, POC 6.8 0.0 - 29.9 mg/g   Albumin 7.5 0.0 - 29.9 mg/L   Creatinine, Urine, POC 110.8 34.0 - 147.0 mg/dL   Lab Results  Component Value Date/Time   Glucose, POC 191 (H) 10/12/2021 08:16 AM   BUN  25 (H) 07/16/2019 09:47 PM   POC CREAT 0.9 09/11/2023 02:42 PM   eGFR If NonAfrican American 73 12/23/2018 11:24 AM   eGFR If African American 84 12/23/2018 11:24 AM   Na 136 07/16/2019 09:47 PM   Potassium 4.2 07/16/2019 09:47 PM   Cl 98 07/16/2019 09:47 PM   CO2 28 07/16/2019 09:47 PM   Ca 9.2 07/16/2019 09:47 PM   Total Protein 6.7 07/16/2019 09:47 PM   Albumin 7.5 07/27/2022 04:59 PM   GLOBULIN 2.5 07/16/2019 09:47 PM   ALBUMIN/GLOBULIN RATIO 1.7 07/16/2019 09:47 PM   T Bili 0.19 07/16/2019 09:47 PM   ALK PHOS 114 07/16/2019 09:47 PM   AST 22 07/16/2019 09:47 PM   ALT 18 07/16/2019 09:47 PM    No results found for: CHOL, TRIG, HDL, VLDL, LDL  Assessment and Plan   1. Uncontrolled type 1 diabetes mellitus with hyperglycemia, with long-term current use of insulin  (*) (Primary) -     Insulin  Disposable Pump (OMNIPOD 5 DEXG7G6 PODS GEN 5) MISC; Place one system onto the skin every 3 (three) days., Starting Tue 12/18/2023, Normal -     Insulin  Disposable Pump (OMNIPOD 5 DEXG7G6 INTRO GEN 5) KIT; 1 each by Does not apply route continuous., Starting Tue 12/18/2023, Normal -     POCT Hemoglobin A1C  Discussion & Recommendations A1c improved. Patient reports she has been really working on eating a healthier diet. She is also interested in the Omnipod 5 system with Dexcom G7. This has been sent to her pharmacy.   14 day AGP shows an average glucose of 140mg /dL with a GMI of 3.2%. She is spending  63% time in range, 26% time above range, and 11% time below range.   Patient Instructions A1c 7.0%  Continue Lantus  12-14 units twice daily  Continue Novolog  10-11 units with meals  Continue to use your Dexcom to monitor glucose levels Omnipod 5 intro kit and pods sent to [facility] Return to clinic in 3 months       Follow up in about 3 months (around 03/17/2024) for T1D (Op5&Dexcom); 40 min.   Documentation for time-based billing:  Total time spent of date of service was 30 minutes.  Patient care activities included preparing to see the patient such as reviewing the patient record, obtaining and/or reviewing separately obtained history, performing a medically appropriate history and physical examination, counseling and educating the patient, family, and/or caregiver, ordering prescription medications, tests, or procedures, referring and communicating with other health care providers when not separately reported during the visit, documenting clinical information in the electronic or other health record, independently interpreting results when not separately reported, communicating results to the patient/family/caregiver, and coordinating the care of the patient when not separately reported.      Delon CINDERELLA Nigh, NP 12/18/2023, 2:33 PM

## 2024-01-25 ENCOUNTER — Ambulatory Visit (INDEPENDENT_AMBULATORY_CARE_PROVIDER_SITE_OTHER): Payer: Medicare Other | Admitting: Physician Assistant

## 2024-01-25 ENCOUNTER — Encounter: Payer: Self-pay | Admitting: Physician Assistant

## 2024-01-25 VITALS — BP 102/63 | HR 73 | Resp 20 | Ht 62.0 in | Wt 147.1 lb

## 2024-01-25 DIAGNOSIS — N760 Acute vaginitis: Secondary | ICD-10-CM

## 2024-01-25 LAB — POCT URINALYSIS DIP (CLINITEK)
Bilirubin, UA: NEGATIVE
Blood, UA: NEGATIVE
Glucose, UA: >=1000 mg/dL — AB
Ketones, POC UA: NEGATIVE mg/dL
Nitrite, UA: NEGATIVE
POC PROTEIN,UA: NEGATIVE
Spec Grav, UA: 1.02 (ref 1.010–1.025)
Urobilinogen, UA: 0.2 U/dL
pH, UA: 6 (ref 5.0–8.0)

## 2024-01-25 MED ORDER — NYSTATIN 100000 UNIT/GM EX OINT
1.0000 | TOPICAL_OINTMENT | Freq: Two times a day (BID) | CUTANEOUS | 1 refills | Status: DC
Start: 2024-01-25 — End: 2024-10-03

## 2024-01-25 MED ORDER — FLUCONAZOLE 150 MG PO TABS
150.0000 mg | ORAL_TABLET | Freq: Once | ORAL | 0 refills | Status: AC
Start: 2024-01-25 — End: 2024-01-25

## 2024-01-25 NOTE — Patient Instructions (Signed)
 Start diflucan and topical nystatin for vaginitis.   Vaginal Yeast Infection, Adult  Vaginal yeast infection is a condition that causes vaginal discharge as well as soreness, swelling, and redness (inflammation) of the vagina. This is a common condition. Some women get this infection frequently. What are the causes? This condition is caused by a change in the normal balance of the yeast (Candida) and normal bacteria that live in the vagina. This change causes an overgrowth of yeast, which causes the inflammation. What increases the risk? The condition is more likely to develop in women who: Take antibiotic medicines. Have diabetes. Take birth control pills. Are pregnant. Douche often. Have a weak body defense system (immune system). Have been taking steroid medicines for a long time. Frequently wear tight clothing. What are the signs or symptoms? Symptoms of this condition include: White, thick, creamy vaginal discharge. Swelling, itching, redness, and irritation of the vagina. The lips of the vagina (labia) may be affected as well. Pain or a burning feeling while urinating. Pain during sex. How is this diagnosed? This condition is diagnosed based on: Your medical history. A physical exam. A pelvic exam. Your health care provider will examine a sample of your vaginal discharge under a microscope. Your health care provider may send this sample for testing to confirm the diagnosis. How is this treated? This condition is treated with medicine. Medicines may be over-the-counter or prescription. You may be told to use one or more of the following: Medicine that is taken by mouth (orally). Medicine that is applied as a cream (topically). Medicine that is inserted directly into the vagina (suppository). Follow these instructions at home: Take or apply over-the-counter and prescription medicines only as told by your health care provider. Do not use tampons until your health care provider  approves. Do not have sex until your infection has cleared. Sex can prolong or worsen your symptoms of infection. Ask your health care provider when it is safe to resume sexual activity. Keep all follow-up visits. This is important. How is this prevented?  Do not wear tight clothes, such as pantyhose or tight pants. Wear breathable cotton underwear. Do not use douches, perfumed soap, creams, or powders. Wipe from front to back after using the toilet. If you have diabetes, keep your blood sugar levels under control. Ask your health care provider for other ways to prevent yeast infections. Contact a health care provider if: You have a fever. Your symptoms go away and then return. Your symptoms do not get better with treatment. Your symptoms get worse. You have new symptoms. You develop blisters in or around your vagina. You have blood coming from your vagina and it is not your menstrual period. You develop pain in your abdomen. Summary Vaginal yeast infection is a condition that causes discharge as well as soreness, swelling, and redness (inflammation) of the vagina. This condition is treated with medicine. Medicines may be over-the-counter or prescription. Take or apply over-the-counter and prescription medicines only as told by your health care provider. Do not douche. Resume sexual activity or use of tampons as instructed by your health care provider. Contact a health care provider if your symptoms do not get better with treatment or your symptoms go away and then return. This information is not intended to replace advice given to you by your health care provider. Make sure you discuss any questions you have with your health care provider. Document Revised: 01/31/2021 Document Reviewed: 01/31/2021 Elsevier Patient Education  2024 [employer].

## 2024-01-25 NOTE — Progress Notes (Signed)
 Acute Office Visit  Subjective:     Patient ID: Sheryl Matthews, female    DOB: Jun 26, 1966, 58 y.o.   MRN: [id number]  CC: vaginal itching   HPI Patient is a 58 yo female who presents with a chief complaint of dysuria and itching. Onset 3 days ago. She states she had liposuction performed recently and believes the UTI/yeast infection is coming from the body suit she has been wearing. Denies any fever, chills, vaginal discharge. She has noticed a red rash over labia that is itchy.    .. 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HCC) 12/22/2014   Pseudophakia of both eyes 12/22/2014   Abdominal pain 06/07/2015   Internal hemorrhoids 08/16/2015   Uncontrolled type 1 diabetes mellitus with hyperglycemia (HCC) 09/15/2015   HAV (hallux abducto valgus) 12/20/2015   Adductovarus rotation of toe, acquired 12/20/2015   Painful breasts 06/14/2016   History of fibrocystic disease of breast 06/14/2016   Left breast mass 07/12/2016   Fibromyalgia 12/26/2016   Vitamin D insufficiency 01/30/2017   Low iron stores 01/30/2017   SOB (shortness of breath) 02/02/2017   No energy 02/02/2017   Chest tightness 02/02/2017   Weakness 02/02/2017   RLS (restless legs syndrome) 12/18/2017   Chronic constipation 12/24/2018   Posterior vitreous degeneration, bilateral 04/14/2019   Tinea manuum 05/05/2019   Toenail fungus 05/05/2019   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   OAB (overactive bladder) 08/12/2019   Esophageal dysphagia 11/10/2019   Nausea and vomiting 11/10/2019   Mid back pain on right side 01/05/2021   Fall 01/05/2021   Neck pain 01/05/2021   Tremor of both hands 05/25/2021   Benign paroxysmal positional vertigo, left 05/25/2021   Gastroparesis 01/03/2023   Overweight (BMI 25.0-29.9) 09/12/2023   Atrophic vaginitis 09/12/2023   Cervical os stenosis 09/18/2023   Atopic  dermatitis 09/18/2023   Post-menopausal 09/18/2023   Dyslipidemia, goal LDL below 70 09/18/2023   Resolved Ambulatory Problems    Diagnosis Date Noted   Type I diabetes mellitus with peripheral autonomic neuropathy (HCC) 11/02/2014   Past Medical History:  Diagnosis Date   Bipolar 1 disorder (HCC)    Deafness in left ear    Depression    Diabetes mellitus without complication (HCC)    Osteoarthritis    Pain of left breast    Skin disorder     Review of Systems  Constitutional:  Negative for chills and fever.  Genitourinary:  Negative for dysuria, flank pain and hematuria.       Irritation  Skin:  Positive for itching.      Objective:   BP Readings from Last 3 Encounters:  01/25/24 102/63  09/12/23 125/75  01/03/23 132/69   Wt Readings from Last 3 Encounters:  01/25/24 147 lb 1.3 oz (66.7 kg)  09/12/23 157 lb (71.2 kg)  01/03/23 155 lb 1.9 oz (70.4 kg)   SpO2 Readings from Last 3 Encounters:  01/25/24 100%  09/12/23 99%  01/03/23 99%    .Marland Kitchen Results for orders placed or performed in visit on 01/25/24  Urine Culture   Collection Time: 01/25/24 11:43 AM   Specimen: Urine   Urine  Result Value Ref Range   Urine Culture, Routine Final report    Organism ID, Bacteria Comment   WET PREP FOR TRICH, YEAST, CLUE   Collection Time: 01/25/24 11:43 AM   Specimen: Vaginal Fluid  Vaginal Flui  Result Value Ref Range   Trichomonas Exam Negative Negative   Yeast Exam Negative Negative   Clue Cell Exam Negative Negative  POCT URINALYSIS DIP (CLINITEK)   Collection Time: 01/25/24 11:53 AM  Result Value Ref Range   Color, UA yellow yellow   Clarity, UA clear clear   Glucose, UA >=1,000 (A) negative mg/dL   Bilirubin, UA negative negative   Ketones, POC UA negative negative mg/dL   Spec Grav, UA 4.098 1.191 - 1.025   Blood, UA negative negative   pH, UA 6.0 5.0 - 8.0   POC PROTEIN,UA negative negative, trace   Urobilinogen, UA 0.2 0.2 or 1.0 E.U./dL   Nitrite, UA  Negative Negative   Leukocytes, UA Trace (A) Negative     Physical Exam Constitutional:      Appearance: Normal appearance.  HENT:     Head: Normocephalic and atraumatic.  Eyes:     Extraocular Movements: Extraocular movements intact.  Cardiovascular:     Rate and Rhythm: Normal rate and regular rhythm.     Pulses: Normal pulses.     Heart sounds: Normal heart sounds.  Pulmonary:     Effort: Pulmonary effort is normal.     Breath sounds: Normal breath sounds.  Abdominal:     General: Bowel sounds are normal. There is no distension.     Palpations: Abdomen is soft.     Tenderness: There is no abdominal tenderness. There is no right CVA tenderness, left CVA tenderness or guarding.  Musculoskeletal:     Cervical back: Normal range of motion.  Skin:    General: Skin is warm.  Neurological:     Mental Status: She is alert.  Psychiatric:        Mood and Affect: Mood normal.        Behavior: Behavior normal.       Assessment & Plan:  Marland KitchenMarland KitchenJamilet was seen today for dysuria.  Diagnoses and all orders for this visit:  Acute vaginitis -     POCT URINALYSIS DIP (CLINITEK) -     Urine Culture -     WET PREP FOR TRICH, YEAST, CLUE -     fluconazole (DIFLUCAN) 150 MG tablet; Take 1 tablet (150 mg total) by mouth once for 1 dose. Repeat in 48-72 hours if symptoms persist. -     nystatin ointment (MYCOSTATIN); Apply 1 Application topically 2 (two) times daily.   UA trace leuks and glucose Will culture Suspect likely more yeast related.  Wet prep ordered Start diflucan and nystatin for topical Follow up as needed if symptoms worsen or change   Tandy Gaw, PA-C

## 2024-01-26 LAB — WET PREP FOR TRICH, YEAST, CLUE
Clue Cell Exam: NEGATIVE
Trichomonas Exam: NEGATIVE
Yeast Exam: NEGATIVE

## 2024-01-27 LAB — URINE CULTURE

## 2024-01-28 ENCOUNTER — Encounter: Payer: Self-pay | Admitting: Physician Assistant

## 2024-01-28 NOTE — Progress Notes (Signed)
 Sheryl Matthews,   No significant bacteria growth. No yeast, trichomonas, bacterial vaginosis. Are you still having any symptoms?

## 2024-02-05 DIAGNOSIS — H526 Other disorders of refraction: Secondary | ICD-10-CM | POA: Diagnosis not present

## 2024-02-05 DIAGNOSIS — E103293 Type 1 diabetes mellitus with mild nonproliferative diabetic retinopathy without macular edema, bilateral: Secondary | ICD-10-CM | POA: Diagnosis not present

## 2024-02-05 DIAGNOSIS — H04123 Dry eye syndrome of bilateral lacrimal glands: Secondary | ICD-10-CM | POA: Diagnosis not present

## 2024-02-05 DIAGNOSIS — Z961 Presence of intraocular lens: Secondary | ICD-10-CM | POA: Diagnosis not present

## 2024-02-05 DIAGNOSIS — H43813 Vitreous degeneration, bilateral: Secondary | ICD-10-CM | POA: Diagnosis not present

## 2024-02-05 DIAGNOSIS — H26491 Other secondary cataract, right eye: Secondary | ICD-10-CM | POA: Diagnosis not present

## 2024-02-05 LAB — OPHTHALMOLOGY REPORT-SCANNED

## 2024-02-11 DIAGNOSIS — F319 Bipolar disorder, unspecified: Secondary | ICD-10-CM | POA: Diagnosis not present

## 2024-03-11 ENCOUNTER — Ambulatory Visit: Payer: Medicare Other | Admitting: Physician Assistant

## 2024-04-11 ENCOUNTER — Ambulatory Visit: Admitting: Physician Assistant

## 2024-04-11 ENCOUNTER — Ambulatory Visit: Payer: Self-pay

## 2024-04-11 DIAGNOSIS — E1065 Type 1 diabetes mellitus with hyperglycemia: Secondary | ICD-10-CM

## 2024-04-11 NOTE — Telephone Encounter (Signed)
 Chief Complaint: depression Symptoms: decreased energy, unable to get out of bed Frequency: hx of bipolar disorder  Pertinent Negatives: Patient denies intent to hurt herself or others, drugs, alcohol , HI Disposition: [] ED /[] Urgent Care (no appt availability in office) / [x] Appointment(In office/virtual)/ []  [facility]/ [] Home Care/ [] Refused Recommended Disposition /[] [facility]/ []  Follow-up with PCP Additional Notes: Pt reports worsening depression and had to cancel her appt today. Pt states she is unable to get out of bed. "I don't want to move, I don't want to get out of bed." Pt states she lives with her husband and he is supportive. Pt states she visited her grandkids in [place]  and came home two weeks ago. When she came home, she had a manic episode for 3 days. Now, pt states she is depressed. Pt states she feels this way after she sees her grandkids because she wishes she lived closer to them. In regards to SI, pt states "when it gets bad and it's unbearable I think about it but I don't want to do it." Pt denies any intent or thought to hurt herself at this time. Pt denies HI and abuse. Denies drugs and alcohol . RN advised the pt she should come into be seen within 24 hours but pt declined, stating she is unable. Pt states she is going to call her psychiatrist to see if they will prescribe her different medications. RN advised the pt if she develops thoughts, an intent, or a plan to hurt herself, or if she becomes unable to perform activities or daily living she should call 911. Pt verbalized understanding. RN informed CAL.    Copied from CRM [id number]. Topic: Clinical - Red Word Triage >> Apr 11, 2024  8:23 AM Danelle Dunning F wrote: Red Word that prompted transfer to Nurse Triage: depressed( feeling like she just doesn't want to get out of bed or do anything); has bipolar disorder; patient is looking to cancel her appointment for today with PCP due to this  No suicidal or  homicidal thoughts Reason for Disposition  [1] Depression AND [2] worsening (e.g., sleeping poorly, less able to do activities of daily living)  Answer Assessment - Initial Assessment Questions 1. CONCERN: "What happened that made you call today?"     Called to cancel appt 2. DEPRESSION SYMPTOM SCREENING: "How are you feeling overall?" (e.g., decreased energy, increased sleeping or difficulty sleeping, difficulty concentrating, feelings of sadness, guilt, hopelessness, or worthlessness)     "I get this way every time I spend time with my grand kids", states she came home from [place]  and had a manic episode. States she was cleaning non-stop and reorganizing for 3 days. Usually I feel hopelessness because my family is so far away and I just want to be next to them, they give me some purpose" 3. RISK OF HARM - SUICIDAL IDEATION:  "Do you ever have thoughts of hurting or killing yourself?"  (e.g., yes, no, no but preoccupation with thoughts about death)   - INTENT:  "Do you have thoughts of hurting or killing yourself right NOW?" (e.g., yes, no, N/A)   - PLAN: "Do you have a specific plan for how you would do this?" (e.g., gun, knife, overdose, no plan, N/A)     "When it gets bad, it's unbearable and I can't dig my way out of the hole I am in. I think about it, but I don't want to do it", denies intent to hurt herself or thoughts of it right now 4.  RISK OF HARM - HOMICIDAL IDEATION:  "Do you ever have thoughts of hurting or killing someone else?"  (e.g., yes, no, no but preoccupation with thoughts about death)   - INTENT:  "Do you have thoughts of hurting or killing someone right NOW?" (e.g., yes, no, N/A)   - PLAN: "Do you have a specific plan for how you would do this?" (e.g., gun, knife, no plan, N/A)      No  5. FUNCTIONAL IMPAIRMENT: "How have things been going for you overall? Have you had more difficulty than usual doing your normal daily activities?"  (e.g., better, same, worse; self-care,  school, work, interactions)     Endorses hx of bipolar, "I get this way." Pt states she does not eat a whole lot, hx of gastroparesis. "I just want to sleep." States her husband cooks. "I don't take showers for a long time, I just don't want to do nothing, I just want to lay in bed." 6. SUPPORT: "Who is with you now?" "Who do you live with?" "Do you have family or friends who you can talk to?"      Lives with husband, states she can talk to him about her feelings 7. THERAPIST: "Do you have a counselor or therapist? Name?"     Pt states she was going to call her psychiatrist  8. STRESSORS: "Has there been any new stress or recent changes in your life?"     "Getting older, I don't like dealing with all these things I have, like my diabetes", missing out on her grandkids 9. ALCOHOL  USE OR SUBSTANCE USE (DRUG USE): "Do you drink alcohol  or use any illegal drugs?"     No  10. OTHER: "Do you have any other physical symptoms right now?" (e.g., fever)       Denies physical symptoms - "just don't want to get out of bed", "my body hurts from getting older" 11. PREGNANCY: "Is there any chance you are pregnant?" "When was your last menstrual period?"       Takes Zoloft. Denies abuse. "I don't want to move, I don't want to go anywhere." "I am going to call my psychiatrist, I am going to see if she can get something."  Protocols used: Depression-A-AH

## 2024-04-25 DIAGNOSIS — E1065 Type 1 diabetes mellitus with hyperglycemia: Secondary | ICD-10-CM | POA: Diagnosis not present

## 2024-04-25 DIAGNOSIS — Z978 Presence of other specified devices: Secondary | ICD-10-CM | POA: Diagnosis not present

## 2024-04-25 NOTE — Progress Notes (Addendum)
 Subjective   Patient ID: Sheryl Matthews is a 58 y.o. (DOB [birth year]) female  History of Present Illness: This is a 58 y.o. female who returns for follow-up of type 1 diabetes .  As noted, initial consult was on 05/31/22 with [name], NP. She was diagnosed with diabetes at age 63.    Medical history significant for T1D, bipolar depression, generalized anxiety disorder, PTSD, retinopathy, IBS with constipation, gastroparesis, s/p cervical spinal fusion, OSA.    [name] is planning on transitioning from MDI to Omnipod 5. She has all her supplies in possession and is just waiting on her Novolog  vials to be delivered from [employer]. Then, she will schedule training.   A1c 8.0% on 04/25/24. Last A1c 7.0% on 12/18/23, 8.1% on 09/13/23, 9.5% on 06/19/23, 8.9% on 01/01/23, 9.1% on 07/27/22, 8.8% on 03/06/22. She has reported that her previous A1cs have been in the 7-8% range.    Patient confirms taking diabetes medications as follows:  Lantus  12-14 units TWICE daily  Novolog  10-11 units TIDAC      Weight trend:  Body mass index is 25.97 kg/m.  Wt Readings from Last 3 Encounters:  04/25/24 142 lb (64.4 kg)  12/18/23 150 lb (68 kg)  09/13/23 154 lb (69.9 kg)    Retinopathy: Mild diabetic nonproliferative retinopathy  Neuropathy: Autonomic; gastroparesis  Nephropathy: No historical documentation    Cardiovascular disease: No historical documentation  Cerebrovascular disease: No historical documentation  Peripheral vascular disease: No historical documentation    Most recent retinal exam:  01/2024   Most recent LDL cholesterol:  139mg /dL (89/82/75)                                       Taking statin?:   NO                        Most recent urine microalbumin/Cr ratio: 6.8mg /g (07/28/23)                           Taking ACEI/ARB?:  NO                                 Taking daily ASA?:   NO                                               Current smoker?:                                                       Discussed smoking cessation?:                             Reviewed and updated this visit by provider: Tobacco  Allergies  Meds  Problems  Med Hx  Surg Hx  Fam Hx      ROS Eight systems were reviewed, and pertinent positives and negatives are as discussed in HPI.  Objective   Vitals:   04/25/24 0921  BP: 124/72  Weight: 142 lb (64.4 kg)   Physical Exam Vitals reviewed.  Cardiovascular:     Rate and Rhythm: Normal rate and regular rhythm.  Pulmonary:     Effort: Pulmonary effort is normal.     Breath sounds: Normal breath sounds.  Skin:    General: Skin is warm and dry.  Neurological:     General: No focal deficit present.     Mental Status: She is alert and oriented to person, place, and time. Mental status is at baseline.  Psychiatric:        Mood and Affect: Mood normal.        Behavior: Behavior normal.        Thought Content: Thought content normal.        Judgment: Judgment normal.        Laboratory data  Lab Results  Component Value Date   Hemoglobin A1c 8.0 (A) 04/25/2024   Hemoglobin A1c 7.0 (A) 12/18/2023   Hemoglobin A1c 9.5 (A) 06/19/2023   Results for orders placed or performed in visit on 07/27/22  POCT ACR URINE  Result Value Ref Range   A/C RATIO UR, POC 6.8 0.0 - 29.9 mg/g   Albumin 7.5 0.0 - 29.9 mg/L   CREATININE UR, POC 110.8 34.0 - 147.0 mg/dL   Lab Results  Component Value Date/Time   Glucose, POC 191 (H) 10/12/2021 08:16 AM   BUN 25 (H) 07/16/2019 09:47 PM   POC CREAT 0.9 09/11/2023 02:42 PM   eGFR If NonAfrican American 73 12/23/2018 11:24 AM   eGFR If African American 84 12/23/2018 11:24 AM   Na 136 07/16/2019 09:47 PM   Potassium 4.2 07/16/2019 09:47 PM   Cl 98 07/16/2019 09:47 PM   CO2 28 07/16/2019 09:47 PM   Ca 9.2 07/16/2019 09:47 PM   Total Protein 6.7 07/16/2019 09:47 PM   Albumin 7.5 07/27/2022 04:59 PM   GLOBULIN 2.5 07/16/2019 09:47 PM   ALBUMIN/GLOBULIN RATIO 1.7 07/16/2019  09:47 PM   T Bili 0.19 07/16/2019 09:47 PM   ALK PHOS 114 07/16/2019 09:47 PM   AST 22 07/16/2019 09:47 PM   ALT 18 07/16/2019 09:47 PM    No results found for: CHOL, TRIG, HDL, VLDL, LDL  Lab Results  Component Value Date/Time   TSH 2.220 12/23/2018 11:24 AM     Assessment and Plan   1. Uncontrolled type 1 diabetes mellitus with hyperglycemia, with long-term current use of insulin  (*) -     POCT Hemoglobin A1C -     Insulin  Disposable Pump (OMNIPOD 5 [id number] PODS GEN 5) MISC; Place one system onto the skin every 3 (three) days., Starting Fri 04/25/2024, Normal 2. Type 1 diabetes mellitus with hyperglycemia (*) 3. Uses self-applied continuous glucose monitoring device  Discussion & Recommendations A1c 8.0%. Patient pending Omnipod 5 training. No changes will be made to current insulin  doses.   Patient Instructions A1c 8.0%  Continue Lantus  12-14 units TWICE daily  Continue Novolog  10-11 units with meals  Continue to use your Dexcom to monitor glucose levels  Reach out to [facility] from [facility] to go ahead and get scheduled for pump training  Return to clinic in 3 months      Follow up in about 3 months (around 07/26/2024) for T2D (Op5&Dexcom); 40 min.   Documentation for time30-based billing:  Total time spent of date of service was  minutes.  Patient care activities included preparing to see the patient such as reviewing the patient record, obtaining and/or reviewing separately  obtained history, performing a medically appropriate history and physical examination, counseling and educating the patient, family, and/or caregiver, ordering prescription medications, tests, or procedures, referring and communicating with other health care providers when not separately reported during the visit, documenting clinical information in the electronic or other health record, independently interpreting results when not separately reported, communicating results to the patient/family/caregiver,  and coordinating the care of the patient when not separately reported.     Delon CINDERELLA Nigh, NP 04/25/2024, 9:54 AM

## 2024-05-05 DIAGNOSIS — H04123 Dry eye syndrome of bilateral lacrimal glands: Secondary | ICD-10-CM | POA: Diagnosis not present

## 2024-05-06 DIAGNOSIS — F319 Bipolar disorder, unspecified: Secondary | ICD-10-CM | POA: Diagnosis not present

## 2024-06-09 ENCOUNTER — Encounter: Payer: Self-pay | Admitting: Physician Assistant

## 2024-06-09 ENCOUNTER — Ambulatory Visit (INDEPENDENT_AMBULATORY_CARE_PROVIDER_SITE_OTHER): Admitting: Physician Assistant

## 2024-06-09 VITALS — BP 136/78 | HR 78 | Ht 62.0 in | Wt 147.0 lb

## 2024-06-09 DIAGNOSIS — B351 Tinea unguium: Secondary | ICD-10-CM

## 2024-06-09 DIAGNOSIS — E1065 Type 1 diabetes mellitus with hyperglycemia: Secondary | ICD-10-CM | POA: Diagnosis not present

## 2024-06-09 DIAGNOSIS — F3112 Bipolar disorder, current episode manic without psychotic features, moderate: Secondary | ICD-10-CM

## 2024-06-09 DIAGNOSIS — M79675 Pain in left toe(s): Secondary | ICD-10-CM

## 2024-06-09 DIAGNOSIS — M79674 Pain in right toe(s): Secondary | ICD-10-CM | POA: Diagnosis not present

## 2024-06-09 DIAGNOSIS — J309 Allergic rhinitis, unspecified: Secondary | ICD-10-CM

## 2024-06-09 MED ORDER — CLOBETASOL PROPIONATE 0.05 % EX SOLN: 1.0000 | Freq: Two times a day (BID) | CUTANEOUS | 0 refills | Status: DC

## 2024-06-09 MED ORDER — METHYLPREDNISOLONE 4 MG PO TBPK: ORAL_TABLET | ORAL | 0 refills | Status: DC

## 2024-06-09 MED ORDER — CICLOPIROX 8 % EX SOLN: Freq: Every day | CUTANEOUS | 0 refills | Status: DC

## 2024-06-09 NOTE — Patient Instructions (Addendum)
 Keep close follow up with [facility].  Try scalp solutions as needed for itchy rash Start toenail antifungal  Referral to podiatry Start medrol  dose pack for sinus symptoms

## 2024-06-09 NOTE — Progress Notes (Signed)
 Established Patient Office Visit  Subjective   Patient ID: Sheryl Matthews, female    DOB: 09-14-1966  Age: 58 y.o. MRN: [id number]  Chief Complaint  Patient presents with   Medical Management of Chronic Issues   Sheryl Matthews presents today for a 6 month follow-up. She has a history of Type 1 Diabetes, Bipolar I, non-proliferative diabetic retinopathy, BPPV, atrophic vaginitis and overactive bladder. She states that she has been experiencing some difficulties with her depressed mood and occasional mania. She was put on a new medication (abilify) a few months ago from psychiatry. She is fearful of aging and feels like her medications have not been helping her. She denies any suicidal ideation or thoughts of harming herself.   Patient also complains of itchy scalp that started 3 weeks ago. She states she has red bumps on her scalp and she has tried multiple shampoos including a dandruff and psoriasis specific shampoo with no relief.   Patient also states she previously requested a referral podiatry that she never scheduled. She states both of her pinky toenails are painful, despite trying to keep them trimmed and filed regularly. She also states that her big toe might have some fungus on it but she is not sure.   Patient states she feels like she has a sinus infection because she has had increased congestion, post-nasal drip, cough, and right ear pain. She has taken Claritin-D which has not been helpful..  Review of Systems  Constitutional:  Negative for malaise/fatigue.  HENT:  Positive for congestion and sinus pain.   Respiratory:  Positive for cough.   Skin:  Positive for itching.       Itching on scalp.   Psychiatric/Behavioral:  Positive for depression. Negative for suicidal ideas. The patient is nervous/anxious and has insomnia.   All other systems reviewed and are negative.     Objective:     BP 136/78   Pulse 78   Ht 5' 2 (1.575 m)   Wt 66.7 kg   LMP 11/25/2017   SpO2 99%    BMI 26.89 kg/m  BP Readings from Last 3 Encounters:  06/09/24 136/78  01/25/24 102/63  09/12/23 125/75      Physical Exam Vitals reviewed.  Constitutional:      General: She is in acute distress.     Appearance: Normal appearance.  HENT:     Head: Normocephalic.     Right Ear: Tympanic membrane normal.     Left Ear: Tympanic membrane normal.     Nose: Nose normal.     Mouth/Throat:     Mouth: Mucous membranes are moist.  Cardiovascular:     Rate and Rhythm: Normal rate and regular rhythm.     Pulses:          Posterior tibial pulses are 2+ on the right side and 2+ on the left side.  Pulmonary:     Effort: Pulmonary effort is normal.     Breath sounds: Normal breath sounds.  Musculoskeletal:     Cervical back: Normal range of motion.       Feet:  Feet:     Right foot:     Toenail Condition: Right toenails are abnormally thick. Fungal disease present.    Left foot:     Toenail Condition: Left toenails are abnormally thick.     Comments: Fungal disease on right big toe. Dystrophic toenails on bilateral pinky toes.  Neurological:     Mental Status: She is alert.  No results found for any visits on 06/09/24.    The ASCVD Risk score (Arnett DK, et al., 2019) failed to calculate for the following reasons:   The valid HDL cholesterol range is 20 to 100 mg/dL   Unable to determine if patient is Non-Hispanic African American    Assessment & Plan:   Problem List Items Addressed This Visit       Musculoskeletal and Integument   Toenail fungus   Relevant Medications   ciclopirox  (PENLAC ) 8 % solution   Other Visit Diagnoses       Allergic sinusitis    -  Primary   Relevant Medications   methylPREDNISolone  (MEDROL  DOSEPAK) 4 MG TBPK tablet     Pain in toes of both feet          Allergic sinusitis  - Medrol  dose pack sent into pharmacy for symptom relief - Continue Claritin - D   Onychomycosis on right big toe Pain in toenails Dystrophic toenails,  bilateral pinky toes - Penlac  sent into pharmacy  - Educated patient on how to use and that it will take a few month until resolution of symptoms/ before the toenail will grow out  - referral to Podiatry due to dystrophic toenails and diabetes mellitus  Itchy scalp - Clobetasol  solution sent into pharmacy - Educated patient on how to use solution   Bipolar I Depressed mood   - Patient's medications are managed by psychiatry - PHQ-9: 11 - GAD-7: 17  - Currently on Zoloft, Abilify daily, Xanax  as needed for anxiety - Recommended patient follows up with psychiatry for routine medication management and discussion of increasing medication dosage if she feels it is not working   Diabetes mellitus type I  - Followed by endocrinology  - Patient uses Lantus , Novolog , dexcom for tracking blood sugars - started using OmniPod 03/2024  No follow-ups on file.    Tinnie FORBES Patient, Student-PA

## 2024-06-10 ENCOUNTER — Encounter: Payer: Self-pay | Admitting: Physician Assistant

## 2024-06-10 DIAGNOSIS — M79674 Pain in right toe(s): Secondary | ICD-10-CM | POA: Insufficient documentation

## 2024-06-20 ENCOUNTER — Encounter: Payer: Self-pay | Admitting: Podiatry

## 2024-06-20 ENCOUNTER — Ambulatory Visit: Admitting: Podiatry

## 2024-06-20 DIAGNOSIS — E1142 Type 2 diabetes mellitus with diabetic polyneuropathy: Secondary | ICD-10-CM

## 2024-06-20 DIAGNOSIS — M2042 Other hammer toe(s) (acquired), left foot: Secondary | ICD-10-CM

## 2024-06-20 DIAGNOSIS — L84 Corns and callosities: Secondary | ICD-10-CM | POA: Diagnosis not present

## 2024-06-20 DIAGNOSIS — M2041 Other hammer toe(s) (acquired), right foot: Secondary | ICD-10-CM | POA: Diagnosis not present

## 2024-06-20 NOTE — Progress Notes (Signed)
  Subjective:  Patient ID: Sheryl Matthews, female    DOB: 1966-01-23,   MRN: [id number]  Chief Complaint  Patient presents with   Diabetes    The nails on my little toes hurt.  I don't know if they are hitting up against my shoe.  I'm getting Neuropathy too, I'm Diabetic.  Saw Delon Nigh - 05//30/2025; A1c - 8.0    58 y.o. female presents for concern of last two toes on feet rubbing against shoes and causing pain. Relates burning and tingling in their feet. Patient is diabetic and last A1c was  Lab Results  Component Value Date   HGBA1C 8.1 (A) 09/12/2023   .   PCP:  Antoniette Vermell CROME, PA-C    . Denies any other pedal complaints. Denies n/v/f/c.   Past Medical History:  Diagnosis Date   Bipolar 1 disorder (HCC)    Deafness in left ear    Depression    Diabetes mellitus without complication (HCC)    Gastroparesis    Osteoarthritis    Pain of left breast    Skin disorder     Objective:  Physical Exam: Vascular: DP/PT pulses 2/4 bilateral. CFT <3 seconds. Absent hair growth on digits. Edema noted to bilateral lower extremities. Skin. No lacerations or abrasions bilateral feet. Nails 1-5 bilateral  are normal in appearance. Hyperkeratotic tissue noted bilateral fifth digit lateral distal with cored aspect.  Musculoskeletal: MMT 5/5 bilateral lower extremities in DF, PF, Inversion and Eversion. Deceased ROM in DF of ankle joint.  Neurological: Sensation intact to light touch. Protective sensation diminished bilateral.    Assessment:   1. Corns and callosities   2. Hammertoe, bilateral   3. Type 2 diabetes mellitus with peripheral neuropathy (HCC)      Plan:  Patient was evaluated and treated and all questions answered. -Discussed and educated patient on diabetic foot care, especially with  regards to the vascular, neurological and musculoskeletal systems.  -Stressed the importance of good glycemic control and the detriment of not  controlling glucose levels in  relation to the foot. -Discussed supportive shoes at all times and checking feet regularly.  -Mechanically debrided all hyperkeratotic tissue bilateral to fifth digits with chisel without incident  -Answered all patient questions -Patient to return  in 3 months for at risk foot care -Patient advised to call the office if any problems or questions arise in the meantime.   Asberry Failing, DPM

## 2024-06-24 DIAGNOSIS — H7292 Unspecified perforation of tympanic membrane, left ear: Secondary | ICD-10-CM | POA: Diagnosis not present

## 2024-06-24 DIAGNOSIS — E1065 Type 1 diabetes mellitus with hyperglycemia: Secondary | ICD-10-CM | POA: Diagnosis not present

## 2024-06-24 DIAGNOSIS — Z978 Presence of other specified devices: Secondary | ICD-10-CM | POA: Diagnosis not present

## 2024-06-24 DIAGNOSIS — Z9641 Presence of insulin pump (external) (internal): Secondary | ICD-10-CM | POA: Diagnosis not present

## 2024-07-07 ENCOUNTER — Ambulatory Visit: Admitting: Family Medicine

## 2024-10-03 ENCOUNTER — Encounter: Payer: Self-pay | Admitting: Physician Assistant

## 2024-10-03 ENCOUNTER — Ambulatory Visit (INDEPENDENT_AMBULATORY_CARE_PROVIDER_SITE_OTHER): Admitting: Physician Assistant

## 2024-10-03 VITALS — BP 133/71 | HR 70 | Ht 62.0 in | Wt 147.0 lb

## 2024-10-03 DIAGNOSIS — E1065 Type 1 diabetes mellitus with hyperglycemia: Secondary | ICD-10-CM

## 2024-10-03 DIAGNOSIS — F3112 Bipolar disorder, current episode manic without psychotic features, moderate: Secondary | ICD-10-CM | POA: Diagnosis not present

## 2024-10-03 DIAGNOSIS — L209 Atopic dermatitis, unspecified: Secondary | ICD-10-CM | POA: Diagnosis not present

## 2024-10-03 DIAGNOSIS — Z79899 Other long term (current) drug therapy: Secondary | ICD-10-CM | POA: Diagnosis not present

## 2024-10-03 DIAGNOSIS — Z23 Encounter for immunization: Secondary | ICD-10-CM | POA: Diagnosis not present

## 2024-10-03 MED ORDER — ARIPIPRAZOLE 5 MG PO TABS: 5.0000 mg | ORAL_TABLET | Freq: Every day | ORAL | 0 refills | Status: DC

## 2024-10-03 MED ORDER — BETAMETHASONE DIPROPIONATE 0.05 % EX OINT: TOPICAL_OINTMENT | Freq: Two times a day (BID) | CUTANEOUS | 1 refills | Status: DC

## 2024-10-03 MED ORDER — SERTRALINE HCL 100 MG PO TABS
200.0000 mg | ORAL_TABLET | Freq: Every day | ORAL | 0 refills | Status: AC
Start: 2024-10-03 — End: ?

## 2024-10-03 MED ORDER — BETAMETHASONE VALERATE 0.12 % EX FOAM: 1.0000 | Freq: Two times a day (BID) | CUTANEOUS | 1 refills | Status: DC

## 2024-10-03 NOTE — Patient Instructions (Signed)
 Will make referral to Dr. Maurice, [facility].

## 2024-10-06 ENCOUNTER — Encounter: Payer: Self-pay | Admitting: Physician Assistant

## 2024-10-06 DIAGNOSIS — Z23 Encounter for immunization: Secondary | ICD-10-CM

## 2024-10-06 DIAGNOSIS — Z79899 Other long term (current) drug therapy: Secondary | ICD-10-CM

## 2024-10-06 DIAGNOSIS — L209 Atopic dermatitis, unspecified: Secondary | ICD-10-CM | POA: Insufficient documentation

## 2024-10-06 NOTE — Progress Notes (Signed)
 Established Patient Office Visit  Subjective   Patient ID: Sheryl Matthews, female    DOB: [birth year]  Age: 58 y.o. MRN: [id number]  Chief Complaint  Patient presents with   Medical Management of Chronic Issues    HPI .SABRADiscussed the use of AI scribe software for clinical note transcription with the patient, who gave verbal consent to proceed.  History of Present Illness Sheryl Matthews is a 58 year old female with depression and diabetes who presents with worsening depression and scalp sores.  Depressive symptoms - Worsening depression since returning from a trip to [place]  - Difficulty getting out of bed, requiring significant effort to do so - Not currently under psychiatric care due to closure of previous psychiatrist's office - Taking sertraline (Zoloft) for depression - Occasional use of an old prescription of Ambien for sleep  Scalp lesions - Sores on the scalp that itch and peel - Lesions are worsening and cause significant pruritus - Trial of thick lotion (Xelumar) and husband's clobetasol  without improvement - Special shampoo from [employer] provides some relief  Diabetes mellitus management - Patient sees Endocrinology - Last hemoglobin A1c was 8.0 - Blood glucose levels increase significantly with stress, even in the absence of food intake - Uses Omnipod insulin  pump for glycemic control - Frequently checks sensor and adjusts insulin  doses accordingly    ROS See HPI.    Objective:     BP 133/71   Pulse 70   Ht 5' 2 (1.575 m)   Wt 147 lb (66.7 kg)   LMP 11/25/2017   SpO2 99%   BMI 26.89 kg/m  BP Readings from Last 3 Encounters:  10/03/24 133/71  06/09/24 136/78  01/25/24 102/63   Wt Readings from Last 3 Encounters:  10/03/24 147 lb (66.7 kg)  06/09/24 147 lb (66.7 kg)  01/25/24 147 lb 1.3 oz (66.7 kg)        06/09/2024    2:51 PM 01/25/2024   11:53 AM 09/12/2023   11:07 AM 01/03/2023    2:04 PM 07/11/2022    3:15 PM  Depression screen PHQ 2/9   Decreased Interest 1 1 0 0 0  Down, Depressed, Hopeless  1 0 0 0  PHQ - 2 Score 1 2 0 0 0  Altered sleeping 1 0     Tired, decreased energy 1 0     Change in appetite 1 1     Feeling bad or failure about yourself  2 0     Trouble concentrating 2 0     Moving slowly or fidgety/restless 2 0     Suicidal thoughts 0 0     PHQ-9 Score 10  3      Difficult doing work/chores Very difficult Not difficult at all        Data saved with a previous flowsheet row definition   .SABRA    06/09/2024    2:51 PM 01/25/2024   11:53 AM 08/11/2019    4:16 PM 07/18/2019   10:31 AM  GAD 7 : Generalized Anxiety Score  Nervous, Anxious, on Edge 2 0 0 3  Control/stop worrying 2 0 3 3  Worry too much - different things 2 0 3 3  Trouble relaxing 2 0 3 3  Restless 3 0 3 3  Easily annoyed or irritable 2 0 3 3  Afraid - awful might happen 2 0 0 2  Total GAD 7 Score 15 0 15 20  Anxiety Difficulty Very difficult Not difficult at all Very  difficult Extremely difficult      Physical Exam Constitutional:      Appearance: Normal appearance.  HENT:     Head: Normocephalic.     Comments: Scattered scaly papules over occipital scalp.  Cardiovascular:     Rate and Rhythm: Normal rate and regular rhythm.  Pulmonary:     Effort: Pulmonary effort is normal.     Breath sounds: Normal breath sounds.  Musculoskeletal:     Right lower leg: No edema.     Left lower leg: No edema.  Skin:    Comments: Patches of scaly skin and excessive dryness over bilateral arms(most notable over elbows)  Neurological:     General: No focal deficit present.     Mental Status: She is alert and oriented to person, place, and time.  Psychiatric:        Mood and Affect: Mood normal.         Assessment & Plan:  SABRASABRAVinnie was seen today for medical management of chronic issues.  Diagnoses and all orders for this visit:  Bipolar 1 disorder with moderate mania (HCC) -     Ambulatory referral to Psychiatry -     ARIPiprazole  (ABILIFY) 5 MG tablet; Take 1 tablet (5 mg total) by mouth daily. -     sertraline (ZOLOFT) 100 MG tablet; Take 2 tablets (200 mg total) by mouth daily. -     Ambulatory referral to Psychiatry  Uncontrolled type 1 diabetes mellitus with hyperglycemia (HCC)  Immunization due -     Flu vaccine trivalent PF, 6mos and older(Flulaval,Afluria,Fluarix,Fluzone)  Medication management -     Flu vaccine trivalent PF, 6mos and older(Flulaval,Afluria,Fluarix,Fluzone) -     Ambulatory referral to Psychiatry -     CBC with Differential/Platelet -     CMP14+EGFR -     Lipid panel -     TSH  Atopic dermatitis, unspecified type -     betamethasone dipropionate (DIPROLENE) 0.05 % ointment; Apply topically 2 (two) times daily. To affected area(s) as needed  Atopic dermatitis of scalp -     Betamethasone Valerate 0.12 % foam; Apply 1 Application topically 2 (two) times daily.   Assessment & Plan Type 1 diabetes mellitus with hyperglycemia Recent A1c of 8.0 indicates suboptimal glycemic control. Stress contributes to hyperglycemia. Uses Omnipod and Dexcom. - Continue Omnipod and Dexcom. - BP to goal - On Statin - Continue management with Endocrinology.   Major depressive disorder/Bipolar 1 Recent exacerbation post-[place]  trip. On sertraline for mood and doxepin for sleep. Previous psychiatrist unavailable. - Referred to psychiatry for medication and stress management. - Continue sertraline and doxepin. - Added abilify daily.   Pruritic scalp dermatitis, possible eczema or psoriasis Pruritic scalp dermatitis with possible eczema or psoriasis. Clobetasol  ineffective. Symptoms include itching and peeling. - Prescribed topical steroid foam, betametasone, which is higher intensity.   Atopic dermatitis of extermities - topical steroid to combine with moisturizer as needed/daily.   General Health Maintenance Received flu shot. Pneumonia vaccination status uncertain. - Administered flu  shot.    Tikita Mabee, PA-C

## 2024-10-08 ENCOUNTER — Telehealth: Payer: Self-pay

## 2024-10-08 DIAGNOSIS — E1065 Type 1 diabetes mellitus with hyperglycemia: Secondary | ICD-10-CM | POA: Diagnosis not present

## 2024-10-08 DIAGNOSIS — Z79899 Other long term (current) drug therapy: Secondary | ICD-10-CM | POA: Diagnosis not present

## 2024-10-08 DIAGNOSIS — Z9641 Presence of insulin pump (external) (internal): Secondary | ICD-10-CM | POA: Diagnosis not present

## 2024-10-08 DIAGNOSIS — Z978 Presence of other specified devices: Secondary | ICD-10-CM | POA: Diagnosis not present

## 2024-10-08 NOTE — Telephone Encounter (Signed)
 Patient came into office to see if is she could get the following medications sent to [facility]  [address] [place], [place] [address]  [phone] ZOLOFT   ABILIFY  BERAMETHASONE DIPROPIONATE OINTMENT 0.05% BETAMETHASONE VALERATE FOAM 0.12%

## 2024-10-09 LAB — CBC WITH DIFFERENTIAL/PLATELET
Basophils Absolute: 0.1 x10E3/uL (ref 0.0–0.2)
Basos: 1 %
EOS (ABSOLUTE): 0.2 x10E3/uL (ref 0.0–0.4)
Eos: 3 %
Hematocrit: 42.6 % (ref 34.0–46.6)
Hemoglobin: 13.4 g/dL (ref 11.1–15.9)
Immature Grans (Abs): 0 x10E3/uL (ref 0.0–0.1)
Immature Granulocytes: 0 %
Lymphocytes Absolute: 1.7 x10E3/uL (ref 0.7–3.1)
Lymphs: 30 %
MCH: 28 pg (ref 26.6–33.0)
MCHC: 31.5 g/dL (ref 31.5–35.7)
MCV: 89 fL (ref 79–97)
Monocytes Absolute: 0.4 x10E3/uL (ref 0.1–0.9)
Monocytes: 8 %
Neutrophils Absolute: 3.2 x10E3/uL (ref 1.4–7.0)
Neutrophils: 58 %
Platelets: 282 x10E3/uL (ref 150–450)
RBC: 4.79 x10E6/uL (ref 3.77–5.28)
RDW: 14.8 % (ref 11.7–15.4)
WBC: 5.6 x10E3/uL (ref 3.4–10.8)

## 2024-10-09 LAB — CMP14+EGFR
ALT: 15 IU/L (ref 0–32)
AST: 19 IU/L (ref 0–40)
Albumin: 4.4 g/dL (ref 3.8–4.9)
Alkaline Phosphatase: 98 IU/L (ref 49–135)
BUN/Creatinine Ratio: 20 (ref 9–23)
BUN: 19 mg/dL (ref 6–24)
Bilirubin Total: 0.5 mg/dL (ref 0.0–1.2)
CO2: 26 mmol/L (ref 20–29)
Calcium: 10 mg/dL (ref 8.7–10.2)
Chloride: 101 mmol/L (ref 96–106)
Creatinine, Ser: 0.95 mg/dL (ref 0.57–1.00)
Globulin, Total: 2.7 g/dL (ref 1.5–4.5)
Glucose: 119 mg/dL — ABNORMAL HIGH (ref 70–99)
Potassium: 4.8 mmol/L (ref 3.5–5.2)
Sodium: 140 mmol/L (ref 134–144)
Total Protein: 7.1 g/dL (ref 6.0–8.5)
eGFR: 69 mL/min/1.73 (ref >59)

## 2024-10-09 LAB — LIPID PANEL
Chol/HDL Ratio: 2.8 ratio (ref 0.0–4.4)
Cholesterol, Total: 290 mg/dL — ABNORMAL HIGH (ref 100–199)
HDL: 104 mg/dL (ref >39)
LDL Chol Calc (NIH): 171 mg/dL — ABNORMAL HIGH (ref 0–99)
Triglycerides: 91 mg/dL (ref 0–149)
VLDL Cholesterol Cal: 15 mg/dL (ref 5–40)

## 2024-10-09 LAB — TSH: TSH: 1.47 u[IU]/mL (ref 0.450–4.500)

## 2024-10-10 ENCOUNTER — Other Ambulatory Visit: Payer: Self-pay

## 2024-10-10 ENCOUNTER — Ambulatory Visit: Payer: Self-pay | Admitting: Physician Assistant

## 2024-10-10 DIAGNOSIS — F3112 Bipolar disorder, current episode manic without psychotic features, moderate: Secondary | ICD-10-CM

## 2024-10-10 DIAGNOSIS — L209 Atopic dermatitis, unspecified: Secondary | ICD-10-CM

## 2024-10-10 MED ORDER — BETAMETHASONE VALERATE 0.12 % EX FOAM: 1.0000 | Freq: Two times a day (BID) | CUTANEOUS | 1 refills | Status: AC

## 2024-10-10 MED ORDER — ARIPIPRAZOLE 5 MG PO TABS: 5.0000 mg | ORAL_TABLET | Freq: Every day | ORAL | 0 refills | Status: AC

## 2024-10-10 MED ORDER — BETAMETHASONE DIPROPIONATE 0.05 % EX OINT: TOPICAL_OINTMENT | Freq: Two times a day (BID) | CUTANEOUS | 1 refills | Status: AC

## 2024-10-10 MED ORDER — SERTRALINE HCL 100 MG PO TABS: 200.0000 mg | ORAL_TABLET | Freq: Every day | ORAL | 0 refills | Status: AC

## 2024-10-10 MED ORDER — ROSUVASTATIN CALCIUM 20 MG PO TABS: 20.0000 mg | ORAL_TABLET | Freq: Every day | ORAL | 3 refills | Status: AC

## 2024-10-10 NOTE — Progress Notes (Signed)
 [place],   Thyroid  looks good.  Kidney and liver look good,.   Cholesterol not to goal. Are you taking crestor  daily? I am going to send over higher dose that you need to take daily!

## 2024-10-13 DIAGNOSIS — H04123 Dry eye syndrome of bilateral lacrimal glands: Secondary | ICD-10-CM | POA: Diagnosis not present

## 2024-10-14 DIAGNOSIS — K581 Irritable bowel syndrome with constipation: Secondary | ICD-10-CM | POA: Diagnosis not present

## 2024-10-14 DIAGNOSIS — K3184 Gastroparesis: Secondary | ICD-10-CM | POA: Diagnosis not present

## 2024-10-14 DIAGNOSIS — K219 Gastro-esophageal reflux disease without esophagitis: Secondary | ICD-10-CM | POA: Diagnosis not present

## 2024-10-14 DIAGNOSIS — K589 Irritable bowel syndrome without diarrhea: Secondary | ICD-10-CM | POA: Diagnosis not present
